# Patient Record
Sex: Female | Born: 1935 | Race: White | Hispanic: No | Marital: Married | Smoking: Never smoker
Health system: Southern US, Community
[De-identification: ages and names within clinical notes are randomized; demographics above are authoritative.]

## PROBLEM LIST (undated history)

## (undated) DIAGNOSIS — I341 Nonrheumatic mitral (valve) prolapse: Secondary | ICD-10-CM

## (undated) DIAGNOSIS — M1990 Unspecified osteoarthritis, unspecified site: Secondary | ICD-10-CM

## (undated) DIAGNOSIS — R011 Cardiac murmur, unspecified: Secondary | ICD-10-CM

## (undated) DIAGNOSIS — E559 Vitamin D deficiency, unspecified: Secondary | ICD-10-CM

## (undated) DIAGNOSIS — R413 Other amnesia: Secondary | ICD-10-CM

## (undated) DIAGNOSIS — G4700 Insomnia, unspecified: Secondary | ICD-10-CM

## (undated) DIAGNOSIS — G43909 Migraine, unspecified, not intractable, without status migrainosus: Secondary | ICD-10-CM

## (undated) DIAGNOSIS — R569 Unspecified convulsions: Secondary | ICD-10-CM

## (undated) DIAGNOSIS — H9319 Tinnitus, unspecified ear: Secondary | ICD-10-CM

## (undated) DIAGNOSIS — M8580 Other specified disorders of bone density and structure, unspecified site: Secondary | ICD-10-CM

## (undated) DIAGNOSIS — I10 Essential (primary) hypertension: Secondary | ICD-10-CM

## (undated) DIAGNOSIS — E7800 Pure hypercholesterolemia, unspecified: Secondary | ICD-10-CM

## (undated) HISTORY — PX: GALLBLADDER SURGERY: SHX652

## (undated) HISTORY — DX: Other amnesia: R41.3

## (undated) HISTORY — DX: Essential (primary) hypertension: I10

## (undated) HISTORY — DX: Tinnitus, unspecified ear: H93.19

## (undated) HISTORY — PX: CHOLECYSTECTOMY: SHX55

## (undated) HISTORY — DX: Other specified disorders of bone density and structure, unspecified site: M85.80

## (undated) HISTORY — DX: Pure hypercholesterolemia, unspecified: E78.00

## (undated) HISTORY — DX: Insomnia, unspecified: G47.00

## (undated) HISTORY — DX: Nonrheumatic mitral (valve) prolapse: I34.1

## (undated) HISTORY — DX: Unspecified osteoarthritis, unspecified site: M19.90

## (undated) HISTORY — DX: Cardiac murmur, unspecified: R01.1

## (undated) HISTORY — PX: CORRECTION HAMMER TOE: SUR317

## (undated) HISTORY — DX: Migraine, unspecified, not intractable, without status migrainosus: G43.909

## (undated) HISTORY — DX: Vitamin D deficiency, unspecified: E55.9

## (undated) HISTORY — PX: OOPHORECTOMY: SHX86

---

## 1972-12-11 HISTORY — PX: PARTIAL HYSTERECTOMY: SHX80

## 1998-09-08 ENCOUNTER — Other Ambulatory Visit: Admission: RE | Admit: 1998-09-08 | Discharge: 1998-09-08 | Payer: Self-pay | Admitting: Gynecology

## 1998-11-23 ENCOUNTER — Ambulatory Visit (HOSPITAL_COMMUNITY): Admission: RE | Admit: 1998-11-23 | Discharge: 1998-11-23 | Payer: Self-pay | Admitting: Obstetrics & Gynecology

## 1998-11-25 ENCOUNTER — Ambulatory Visit (HOSPITAL_COMMUNITY): Admission: RE | Admit: 1998-11-25 | Discharge: 1998-11-25 | Payer: Self-pay | Admitting: Gastroenterology

## 1999-10-20 ENCOUNTER — Encounter: Admission: RE | Admit: 1999-10-20 | Discharge: 1999-10-20 | Payer: Self-pay | Admitting: Internal Medicine

## 1999-10-20 ENCOUNTER — Encounter: Payer: Self-pay | Admitting: Internal Medicine

## 1999-11-07 ENCOUNTER — Other Ambulatory Visit: Admission: RE | Admit: 1999-11-07 | Discharge: 1999-11-07 | Payer: Self-pay | Admitting: Gynecology

## 2000-08-22 ENCOUNTER — Encounter: Payer: Self-pay | Admitting: Internal Medicine

## 2000-08-22 ENCOUNTER — Encounter: Admission: RE | Admit: 2000-08-22 | Discharge: 2000-08-22 | Payer: Self-pay | Admitting: Internal Medicine

## 2001-04-04 ENCOUNTER — Other Ambulatory Visit: Admission: RE | Admit: 2001-04-04 | Discharge: 2001-04-04 | Payer: Self-pay | Admitting: Gynecology

## 2001-09-02 ENCOUNTER — Encounter: Payer: Self-pay | Admitting: *Deleted

## 2001-09-02 ENCOUNTER — Encounter: Admission: RE | Admit: 2001-09-02 | Discharge: 2001-09-02 | Payer: Self-pay | Admitting: *Deleted

## 2002-07-17 ENCOUNTER — Encounter: Payer: Self-pay | Admitting: Internal Medicine

## 2002-07-17 ENCOUNTER — Encounter: Admission: RE | Admit: 2002-07-17 | Discharge: 2002-07-17 | Payer: Self-pay | Admitting: Internal Medicine

## 2002-07-21 ENCOUNTER — Other Ambulatory Visit: Admission: RE | Admit: 2002-07-21 | Discharge: 2002-07-21 | Payer: Self-pay | Admitting: Gynecology

## 2002-10-28 ENCOUNTER — Encounter: Admission: RE | Admit: 2002-10-28 | Discharge: 2002-10-28 | Payer: Self-pay | Admitting: Internal Medicine

## 2002-10-28 ENCOUNTER — Encounter: Payer: Self-pay | Admitting: Internal Medicine

## 2003-10-30 ENCOUNTER — Encounter: Admission: RE | Admit: 2003-10-30 | Discharge: 2003-10-30 | Payer: Self-pay | Admitting: Internal Medicine

## 2004-12-14 ENCOUNTER — Encounter: Admission: RE | Admit: 2004-12-14 | Discharge: 2004-12-14 | Payer: Self-pay | Admitting: Internal Medicine

## 2005-04-19 ENCOUNTER — Other Ambulatory Visit: Admission: RE | Admit: 2005-04-19 | Discharge: 2005-04-19 | Payer: Self-pay | Admitting: Obstetrics and Gynecology

## 2005-12-29 ENCOUNTER — Encounter: Admission: RE | Admit: 2005-12-29 | Discharge: 2005-12-29 | Payer: Self-pay | Admitting: Obstetrics and Gynecology

## 2007-01-10 ENCOUNTER — Encounter: Admission: RE | Admit: 2007-01-10 | Discharge: 2007-01-10 | Payer: Self-pay | Admitting: Internal Medicine

## 2008-01-13 ENCOUNTER — Encounter: Admission: RE | Admit: 2008-01-13 | Discharge: 2008-01-13 | Payer: Self-pay | Admitting: Internal Medicine

## 2008-09-29 ENCOUNTER — Other Ambulatory Visit: Admission: RE | Admit: 2008-09-29 | Discharge: 2008-09-29 | Payer: Self-pay | Admitting: Obstetrics and Gynecology

## 2009-01-13 ENCOUNTER — Encounter: Admission: RE | Admit: 2009-01-13 | Discharge: 2009-01-13 | Payer: Self-pay | Admitting: Obstetrics and Gynecology

## 2010-01-11 ENCOUNTER — Other Ambulatory Visit: Admission: RE | Admit: 2010-01-11 | Discharge: 2010-01-11 | Payer: Self-pay | Admitting: Obstetrics and Gynecology

## 2010-02-02 ENCOUNTER — Encounter: Admission: RE | Admit: 2010-02-02 | Discharge: 2010-02-02 | Payer: Self-pay | Admitting: Obstetrics and Gynecology

## 2010-12-31 ENCOUNTER — Other Ambulatory Visit: Payer: Self-pay | Admitting: Internal Medicine

## 2010-12-31 ENCOUNTER — Encounter: Payer: Self-pay | Admitting: Internal Medicine

## 2010-12-31 DIAGNOSIS — Z1239 Encounter for other screening for malignant neoplasm of breast: Secondary | ICD-10-CM

## 2011-02-03 ENCOUNTER — Ambulatory Visit: Payer: Self-pay

## 2011-02-28 ENCOUNTER — Ambulatory Visit
Admission: RE | Admit: 2011-02-28 | Discharge: 2011-02-28 | Disposition: A | Discharge status: Home / Self Care | Payer: MEDICARE | Source: Ambulatory Visit | Attending: Internal Medicine | Admitting: Internal Medicine

## 2011-02-28 DIAGNOSIS — Z1239 Encounter for other screening for malignant neoplasm of breast: Secondary | ICD-10-CM

## 2011-12-25 DIAGNOSIS — R252 Cramp and spasm: Secondary | ICD-10-CM | POA: Diagnosis not present

## 2011-12-25 DIAGNOSIS — E785 Hyperlipidemia, unspecified: Secondary | ICD-10-CM | POA: Diagnosis not present

## 2011-12-25 DIAGNOSIS — I10 Essential (primary) hypertension: Secondary | ICD-10-CM | POA: Diagnosis not present

## 2012-01-25 ENCOUNTER — Other Ambulatory Visit: Payer: Self-pay | Admitting: Internal Medicine

## 2012-01-25 DIAGNOSIS — Z1231 Encounter for screening mammogram for malignant neoplasm of breast: Secondary | ICD-10-CM

## 2012-02-29 ENCOUNTER — Ambulatory Visit
Admission: RE | Admit: 2012-02-29 | Discharge: 2012-02-29 | Disposition: A | Discharge status: Home / Self Care | Payer: MEDICARE | Source: Ambulatory Visit | Attending: Internal Medicine | Admitting: Internal Medicine

## 2012-02-29 DIAGNOSIS — Z1231 Encounter for screening mammogram for malignant neoplasm of breast: Secondary | ICD-10-CM | POA: Diagnosis not present

## 2012-04-01 DIAGNOSIS — G479 Sleep disorder, unspecified: Secondary | ICD-10-CM | POA: Diagnosis not present

## 2012-04-01 DIAGNOSIS — G2589 Other specified extrapyramidal and movement disorders: Secondary | ICD-10-CM | POA: Diagnosis not present

## 2012-04-16 DIAGNOSIS — L608 Other nail disorders: Secondary | ICD-10-CM | POA: Diagnosis not present

## 2012-05-15 DIAGNOSIS — R252 Cramp and spasm: Secondary | ICD-10-CM | POA: Diagnosis not present

## 2012-06-25 DIAGNOSIS — R4182 Altered mental status, unspecified: Secondary | ICD-10-CM | POA: Diagnosis not present

## 2012-06-25 DIAGNOSIS — R404 Transient alteration of awareness: Secondary | ICD-10-CM | POA: Diagnosis not present

## 2012-07-05 DIAGNOSIS — H6120 Impacted cerumen, unspecified ear: Secondary | ICD-10-CM | POA: Diagnosis not present

## 2012-07-05 DIAGNOSIS — H93299 Other abnormal auditory perceptions, unspecified ear: Secondary | ICD-10-CM | POA: Diagnosis not present

## 2012-07-08 ENCOUNTER — Other Ambulatory Visit: Payer: Self-pay | Admitting: Internal Medicine

## 2012-07-08 DIAGNOSIS — I10 Essential (primary) hypertension: Secondary | ICD-10-CM | POA: Diagnosis not present

## 2012-07-08 DIAGNOSIS — G43009 Migraine without aura, not intractable, without status migrainosus: Secondary | ICD-10-CM | POA: Diagnosis not present

## 2012-07-08 DIAGNOSIS — Z0000 Encounter for general adult medical examination without abnormal findings: Secondary | ICD-10-CM | POA: Diagnosis not present

## 2012-07-08 DIAGNOSIS — F05 Delirium due to known physiological condition: Secondary | ICD-10-CM | POA: Diagnosis not present

## 2012-07-08 DIAGNOSIS — R410 Disorientation, unspecified: Secondary | ICD-10-CM

## 2012-07-08 DIAGNOSIS — E785 Hyperlipidemia, unspecified: Secondary | ICD-10-CM | POA: Diagnosis not present

## 2012-07-09 ENCOUNTER — Ambulatory Visit
Admission: RE | Admit: 2012-07-09 | Discharge: 2012-07-09 | Disposition: A | Discharge status: Home / Self Care | Payer: Medicare Other | Source: Ambulatory Visit | Attending: Internal Medicine | Admitting: Internal Medicine

## 2012-07-09 DIAGNOSIS — I059 Rheumatic mitral valve disease, unspecified: Secondary | ICD-10-CM | POA: Diagnosis not present

## 2012-07-09 DIAGNOSIS — I6529 Occlusion and stenosis of unspecified carotid artery: Secondary | ICD-10-CM | POA: Diagnosis not present

## 2012-07-09 DIAGNOSIS — F29 Unspecified psychosis not due to a substance or known physiological condition: Secondary | ICD-10-CM | POA: Diagnosis not present

## 2012-07-09 DIAGNOSIS — R410 Disorientation, unspecified: Secondary | ICD-10-CM

## 2012-07-09 DIAGNOSIS — R4789 Other speech disturbances: Secondary | ICD-10-CM | POA: Diagnosis not present

## 2012-07-09 DIAGNOSIS — F05 Delirium due to known physiological condition: Secondary | ICD-10-CM | POA: Diagnosis not present

## 2012-07-17 DIAGNOSIS — E7800 Pure hypercholesterolemia, unspecified: Secondary | ICD-10-CM | POA: Diagnosis not present

## 2012-07-17 DIAGNOSIS — Z01419 Encounter for gynecological examination (general) (routine) without abnormal findings: Secondary | ICD-10-CM | POA: Diagnosis not present

## 2012-07-17 DIAGNOSIS — R4701 Aphasia: Secondary | ICD-10-CM | POA: Diagnosis not present

## 2012-07-17 DIAGNOSIS — G43909 Migraine, unspecified, not intractable, without status migrainosus: Secondary | ICD-10-CM | POA: Diagnosis not present

## 2012-08-06 DIAGNOSIS — M76899 Other specified enthesopathies of unspecified lower limb, excluding foot: Secondary | ICD-10-CM | POA: Diagnosis not present

## 2012-08-06 DIAGNOSIS — G479 Sleep disorder, unspecified: Secondary | ICD-10-CM | POA: Diagnosis not present

## 2012-08-06 DIAGNOSIS — R252 Cramp and spasm: Secondary | ICD-10-CM | POA: Diagnosis not present

## 2012-09-04 DIAGNOSIS — M79609 Pain in unspecified limb: Secondary | ICD-10-CM | POA: Diagnosis not present

## 2012-09-04 DIAGNOSIS — M2040 Other hammer toe(s) (acquired), unspecified foot: Secondary | ICD-10-CM | POA: Diagnosis not present

## 2012-09-04 DIAGNOSIS — L608 Other nail disorders: Secondary | ICD-10-CM | POA: Diagnosis not present

## 2012-09-09 DIAGNOSIS — L578 Other skin changes due to chronic exposure to nonionizing radiation: Secondary | ICD-10-CM | POA: Diagnosis not present

## 2012-09-09 DIAGNOSIS — L570 Actinic keratosis: Secondary | ICD-10-CM | POA: Diagnosis not present

## 2012-09-09 DIAGNOSIS — D692 Other nonthrombocytopenic purpura: Secondary | ICD-10-CM | POA: Diagnosis not present

## 2012-09-09 DIAGNOSIS — L820 Inflamed seborrheic keratosis: Secondary | ICD-10-CM | POA: Diagnosis not present

## 2012-09-09 DIAGNOSIS — L819 Disorder of pigmentation, unspecified: Secondary | ICD-10-CM | POA: Diagnosis not present

## 2012-10-06 ENCOUNTER — Ambulatory Visit (INDEPENDENT_AMBULATORY_CARE_PROVIDER_SITE_OTHER): Payer: Medicare Other | Admitting: Emergency Medicine

## 2012-10-06 VITALS — BP 127/71 | HR 79 | Temp 97.4°F | Resp 16 | Ht 63.75 in | Wt 136.0 lb

## 2012-10-06 DIAGNOSIS — N3000 Acute cystitis without hematuria: Secondary | ICD-10-CM | POA: Diagnosis not present

## 2012-10-06 DIAGNOSIS — R309 Painful micturition, unspecified: Secondary | ICD-10-CM

## 2012-10-06 LAB — POCT URINALYSIS DIPSTICK
Bilirubin, UA: NEGATIVE
Blood, UA: NEGATIVE
Glucose, UA: NEGATIVE
Ketones, UA: NEGATIVE
Nitrite, UA: POSITIVE
Protein, UA: NEGATIVE
Spec Grav, UA: 1.01
Urobilinogen, UA: 0.2
pH, UA: 5.5

## 2012-10-06 LAB — POCT UA - MICROSCOPIC ONLY
Casts, Ur, LPF, POC: NEGATIVE
Crystals, Ur, HPF, POC: NEGATIVE
Mucus, UA: NEGATIVE
RBC, urine, microscopic: NEGATIVE
Yeast, UA: NEGATIVE

## 2012-10-06 MED ORDER — SULFAMETHOXAZOLE-TRIMETHOPRIM 800-160 MG PO TABS: 1.0000 | ORAL_TABLET | Freq: Two times a day (BID) | ORAL | Status: DC

## 2012-10-06 MED ORDER — PHENAZOPYRIDINE HCL 200 MG PO TABS: 200.0000 mg | ORAL_TABLET | Freq: Three times a day (TID) | ORAL | Status: AC | PRN

## 2012-10-06 NOTE — Progress Notes (Signed)
[facility] [address], [place] [address] [phone]  Date:  10/06/2012   Name:  Renee Zuniga   DOB:  [birth year]   MRN:  [id number]  PCP:  No primary provider on file.    Chief Complaint: Urinary Tract Infection   History of Present Illness:  Renee Zuniga is a 78 y.o. very pleasant female patient who presents with the following:  Ill this week with marked increase in frequency.  No incontinence.  No fever or chills. No abdominal or back pain, nausea or vomiting.  No stool change, blood in urine or stool, black stool.  History of repeated urinary infections.    There is no problem list on file for this patient.   Past Medical History  Diagnosis Date  . Arthritis   . Heart murmur     Past Surgical History  Procedure Date  . Cholecystectomy   . Abdominal hysterectomy     History  Substance Use Topics  . Smoking status: Never Smoker   . Smokeless tobacco: Not on file  . Alcohol Use:     Family History  Problem Relation Age of Onset  . Mitral valve prolapse Father     No Known Allergies  Medication list has been reviewed and updated.  Current Outpatient Prescriptions on File Prior to Visit  Medication Sig Dispense Refill  . calcium carbonate (OS-CAL - DOSED IN MG OF ELEMENTAL CALCIUM) 1250 MG tablet Take 1 tablet by mouth daily.      Marland Kitchen lisinopril (PRINIVIL,ZESTRIL) 10 MG tablet Take 10 mg by mouth daily.        Review of Systems:  As per HPI, otherwise negative.    Physical Examination: Filed Vitals:   10/06/12 0820  BP: 127/71  Pulse: 79  Temp: 97.4 F (36.3 C)  Resp: 16   Filed Vitals:   10/06/12 0820  Height: 5' 3.75" (1.619 m)  Weight: 136 lb (61.689 kg)   Body mass index is 23.53 kg/(m^2). Ideal Body Weight: Weight in (lb) to have BMI = 25: 144.2    GEN: WDWN, NAD, Non-toxic, Alert & Oriented x 3 no rash or sepsis HEENT: Atraumatic, Normocephalic. Oropharynx negative Ears and Nose: No external deformity.  TM  negative ABD:  Benign soft not tender.  No CVA tender EXTR: No clubbing/cyanosis/edema NEURO: Normal gait.  PSYCH: Normally interactive. Conversant. Not depressed or anxious appearing.  Calm demeanor.    Assessment and Plan: Acute cystitis Septra ds Pyridium Follow up as needed  Carmelina Dane, MD  Results for orders placed in visit on 10/06/12  POCT UA - MICROSCOPIC ONLY      Component Value Range   WBC, Ur, HPF, POC 2-5 with small clumps     RBC, urine, microscopic neg     Bacteria, U Microscopic 4++     Mucus, UA neg     Epithelial cells, urine per micros 0-1     Crystals, Ur, HPF, POC neg     Casts, Ur, LPF, POC neg     Yeast, UA neg    POCT URINALYSIS DIPSTICK      Component Value Range   Color, UA yellow     Clarity, UA clear     Glucose, UA neg     Bilirubin, UA neg     Ketones, UA neg     Spec Grav, UA 1.010     Blood, UA neg     pH, UA 5.5  Protein, UA neg     Urobilinogen, UA 0.2     Nitrite, UA positive     Leukocytes, UA Trace

## 2012-10-10 DIAGNOSIS — G2589 Other specified extrapyramidal and movement disorders: Secondary | ICD-10-CM | POA: Diagnosis not present

## 2012-10-14 NOTE — Progress Notes (Signed)
Reviewed and agree.

## 2012-11-11 DIAGNOSIS — G479 Sleep disorder, unspecified: Secondary | ICD-10-CM | POA: Diagnosis not present

## 2012-11-11 DIAGNOSIS — G2589 Other specified extrapyramidal and movement disorders: Secondary | ICD-10-CM | POA: Diagnosis not present

## 2012-11-11 DIAGNOSIS — Z23 Encounter for immunization: Secondary | ICD-10-CM | POA: Diagnosis not present

## 2012-11-12 DIAGNOSIS — M76829 Posterior tibial tendinitis, unspecified leg: Secondary | ICD-10-CM | POA: Diagnosis not present

## 2012-11-12 DIAGNOSIS — M2140 Flat foot [pes planus] (acquired), unspecified foot: Secondary | ICD-10-CM | POA: Diagnosis not present

## 2012-12-02 DIAGNOSIS — L608 Other nail disorders: Secondary | ICD-10-CM | POA: Diagnosis not present

## 2012-12-03 DIAGNOSIS — G479 Sleep disorder, unspecified: Secondary | ICD-10-CM | POA: Diagnosis not present

## 2012-12-03 DIAGNOSIS — M1990 Unspecified osteoarthritis, unspecified site: Secondary | ICD-10-CM | POA: Diagnosis not present

## 2012-12-03 DIAGNOSIS — N183 Chronic kidney disease, stage 3 unspecified: Secondary | ICD-10-CM | POA: Diagnosis not present

## 2012-12-18 DIAGNOSIS — R209 Unspecified disturbances of skin sensation: Secondary | ICD-10-CM | POA: Diagnosis not present

## 2012-12-18 DIAGNOSIS — M6240 Contracture of muscle, unspecified site: Secondary | ICD-10-CM | POA: Diagnosis not present

## 2012-12-18 DIAGNOSIS — Q742 Other congenital malformations of lower limb(s), including pelvic girdle: Secondary | ICD-10-CM | POA: Diagnosis not present

## 2012-12-18 DIAGNOSIS — Z01818 Encounter for other preprocedural examination: Secondary | ICD-10-CM | POA: Diagnosis not present

## 2012-12-18 DIAGNOSIS — Z09 Encounter for follow-up examination after completed treatment for conditions other than malignant neoplasm: Secondary | ICD-10-CM | POA: Diagnosis not present

## 2012-12-18 DIAGNOSIS — L608 Other nail disorders: Secondary | ICD-10-CM | POA: Diagnosis not present

## 2012-12-31 DIAGNOSIS — M79609 Pain in unspecified limb: Secondary | ICD-10-CM | POA: Diagnosis not present

## 2012-12-31 DIAGNOSIS — N390 Urinary tract infection, site not specified: Secondary | ICD-10-CM | POA: Diagnosis not present

## 2012-12-31 DIAGNOSIS — M2040 Other hammer toe(s) (acquired), unspecified foot: Secondary | ICD-10-CM | POA: Diagnosis not present

## 2013-01-13 DIAGNOSIS — M2040 Other hammer toe(s) (acquired), unspecified foot: Secondary | ICD-10-CM | POA: Diagnosis not present

## 2013-01-13 DIAGNOSIS — M6240 Contracture of muscle, unspecified site: Secondary | ICD-10-CM | POA: Diagnosis not present

## 2013-01-21 ENCOUNTER — Other Ambulatory Visit: Payer: Self-pay | Admitting: Internal Medicine

## 2013-01-21 DIAGNOSIS — Z1231 Encounter for screening mammogram for malignant neoplasm of breast: Secondary | ICD-10-CM

## 2013-02-10 DIAGNOSIS — Z09 Encounter for follow-up examination after completed treatment for conditions other than malignant neoplasm: Secondary | ICD-10-CM | POA: Diagnosis not present

## 2013-02-10 DIAGNOSIS — M2030 Hallux varus (acquired), unspecified foot: Secondary | ICD-10-CM | POA: Diagnosis not present

## 2013-02-25 DIAGNOSIS — L820 Inflamed seborrheic keratosis: Secondary | ICD-10-CM | POA: Diagnosis not present

## 2013-02-25 DIAGNOSIS — L821 Other seborrheic keratosis: Secondary | ICD-10-CM | POA: Diagnosis not present

## 2013-02-25 DIAGNOSIS — L819 Disorder of pigmentation, unspecified: Secondary | ICD-10-CM | POA: Diagnosis not present

## 2013-03-10 ENCOUNTER — Ambulatory Visit
Admission: RE | Admit: 2013-03-10 | Discharge: 2013-03-10 | Disposition: A | Discharge status: Home / Self Care | Payer: Medicare Other | Source: Ambulatory Visit | Attending: Internal Medicine | Admitting: Internal Medicine

## 2013-03-10 DIAGNOSIS — Z1231 Encounter for screening mammogram for malignant neoplasm of breast: Secondary | ICD-10-CM | POA: Diagnosis not present

## 2013-03-11 DIAGNOSIS — G479 Sleep disorder, unspecified: Secondary | ICD-10-CM | POA: Diagnosis not present

## 2013-03-11 DIAGNOSIS — G2589 Other specified extrapyramidal and movement disorders: Secondary | ICD-10-CM | POA: Diagnosis not present

## 2013-03-25 DIAGNOSIS — H2510 Age-related nuclear cataract, unspecified eye: Secondary | ICD-10-CM | POA: Diagnosis not present

## 2013-03-27 DIAGNOSIS — J069 Acute upper respiratory infection, unspecified: Secondary | ICD-10-CM | POA: Diagnosis not present

## 2013-04-25 DIAGNOSIS — G479 Sleep disorder, unspecified: Secondary | ICD-10-CM | POA: Diagnosis not present

## 2013-07-03 DIAGNOSIS — R350 Frequency of micturition: Secondary | ICD-10-CM | POA: Diagnosis not present

## 2013-07-14 DIAGNOSIS — N390 Urinary tract infection, site not specified: Secondary | ICD-10-CM | POA: Diagnosis not present

## 2013-07-31 DIAGNOSIS — G479 Sleep disorder, unspecified: Secondary | ICD-10-CM | POA: Diagnosis not present

## 2013-08-01 DIAGNOSIS — R413 Other amnesia: Secondary | ICD-10-CM | POA: Diagnosis not present

## 2013-08-01 DIAGNOSIS — F05 Delirium due to known physiological condition: Secondary | ICD-10-CM | POA: Diagnosis not present

## 2013-08-05 DIAGNOSIS — L608 Other nail disorders: Secondary | ICD-10-CM | POA: Diagnosis not present

## 2013-08-13 DIAGNOSIS — R350 Frequency of micturition: Secondary | ICD-10-CM | POA: Diagnosis not present

## 2013-08-13 DIAGNOSIS — Z01419 Encounter for gynecological examination (general) (routine) without abnormal findings: Secondary | ICD-10-CM | POA: Diagnosis not present

## 2013-08-13 DIAGNOSIS — N8111 Cystocele, midline: Secondary | ICD-10-CM | POA: Diagnosis not present

## 2013-08-13 DIAGNOSIS — I059 Rheumatic mitral valve disease, unspecified: Secondary | ICD-10-CM | POA: Diagnosis not present

## 2013-08-19 DIAGNOSIS — G479 Sleep disorder, unspecified: Secondary | ICD-10-CM | POA: Diagnosis not present

## 2013-08-19 DIAGNOSIS — I10 Essential (primary) hypertension: Secondary | ICD-10-CM | POA: Diagnosis not present

## 2013-08-19 DIAGNOSIS — E785 Hyperlipidemia, unspecified: Secondary | ICD-10-CM | POA: Diagnosis not present

## 2013-08-19 DIAGNOSIS — R350 Frequency of micturition: Secondary | ICD-10-CM | POA: Diagnosis not present

## 2013-08-19 DIAGNOSIS — M810 Age-related osteoporosis without current pathological fracture: Secondary | ICD-10-CM | POA: Diagnosis not present

## 2013-08-19 DIAGNOSIS — R413 Other amnesia: Secondary | ICD-10-CM | POA: Diagnosis not present

## 2013-08-19 DIAGNOSIS — N183 Chronic kidney disease, stage 3 unspecified: Secondary | ICD-10-CM | POA: Diagnosis not present

## 2013-08-23 ENCOUNTER — Encounter: Payer: Self-pay | Admitting: Neurology

## 2013-08-23 DIAGNOSIS — H9319 Tinnitus, unspecified ear: Secondary | ICD-10-CM | POA: Insufficient documentation

## 2013-08-23 DIAGNOSIS — R413 Other amnesia: Secondary | ICD-10-CM

## 2013-08-23 DIAGNOSIS — I10 Essential (primary) hypertension: Secondary | ICD-10-CM

## 2013-08-23 DIAGNOSIS — M1990 Unspecified osteoarthritis, unspecified site: Secondary | ICD-10-CM | POA: Insufficient documentation

## 2013-08-23 DIAGNOSIS — I341 Nonrheumatic mitral (valve) prolapse: Secondary | ICD-10-CM | POA: Insufficient documentation

## 2013-08-23 DIAGNOSIS — G43909 Migraine, unspecified, not intractable, without status migrainosus: Secondary | ICD-10-CM

## 2013-08-23 DIAGNOSIS — E7800 Pure hypercholesterolemia, unspecified: Secondary | ICD-10-CM

## 2013-08-23 DIAGNOSIS — E559 Vitamin D deficiency, unspecified: Secondary | ICD-10-CM

## 2013-08-23 DIAGNOSIS — R011 Cardiac murmur, unspecified: Secondary | ICD-10-CM | POA: Insufficient documentation

## 2013-08-25 DIAGNOSIS — R339 Retention of urine, unspecified: Secondary | ICD-10-CM | POA: Diagnosis not present

## 2013-08-25 DIAGNOSIS — N8111 Cystocele, midline: Secondary | ICD-10-CM | POA: Diagnosis not present

## 2013-08-26 ENCOUNTER — Ambulatory Visit (INDEPENDENT_AMBULATORY_CARE_PROVIDER_SITE_OTHER): Payer: Medicare Other | Admitting: Neurology

## 2013-08-26 ENCOUNTER — Encounter: Payer: Self-pay | Admitting: Neurology

## 2013-08-26 ENCOUNTER — Telehealth: Payer: Self-pay | Admitting: Neurology

## 2013-08-26 VITALS — BP 145/82 | HR 100 | Ht 64.5 in | Wt 143.0 lb

## 2013-08-26 DIAGNOSIS — F05 Delirium due to known physiological condition: Secondary | ICD-10-CM | POA: Insufficient documentation

## 2013-08-26 DIAGNOSIS — E559 Vitamin D deficiency, unspecified: Secondary | ICD-10-CM

## 2013-08-26 DIAGNOSIS — G43909 Migraine, unspecified, not intractable, without status migrainosus: Secondary | ICD-10-CM

## 2013-08-26 DIAGNOSIS — I10 Essential (primary) hypertension: Secondary | ICD-10-CM | POA: Diagnosis not present

## 2013-08-26 DIAGNOSIS — E7800 Pure hypercholesterolemia, unspecified: Secondary | ICD-10-CM | POA: Diagnosis not present

## 2013-08-26 NOTE — Progress Notes (Signed)
[facility]  PATIENT: Renee Zuniga DOB: 1931/01/04  HISTORICAL  Renee Zuniga is 79 years old right-handed Caucasian female, is referred by her primary care physician Dr. Nehemiah Settle for evaluation of one episode of confusion.  She has past medical history of mitral valve relapse, migraine headaches, she also has frequent muscle cramping, insomnia, she has been taking different sleeping aid,  she used to take Ambien, about a year ago, she began to worry about the potential side effect of Ambien, change to Lunesta, 3mg  qhs.   One night in early August 2014, she took 3 mg [place] like what she usually does, next morning, she was able to go down stairs without any difficulty, but she noticed she was confused, could not get her words out, she denies lateralized motor or sensory deficit, no swallowing difficulty, symptoms last about 10 hours, she presented to her primary care physician, but no imaging study was initiated.  In July 2013, she had one episode of confusion, speech disturbance, with associated headaches, she had evaluation at that time, MRI of the brain showed showed moderate small vessel disease, no acute lesions, MRA of the brain showed no large vessel disease, echocardiogram showed preserved ejection fraction.  Ultrasound of carotid arteries showed less than 50% stenosis of bilateral internal carotid artery  REVIEW OF SYSTEMS: Full 14 system review of systems performed and notable only for confusion  ALLERGIES: Allergies  Allergen Reactions  . Demerol [Meperidine]     Sick on stomach   . Lunesta [Eszopiclone]     Speech     HOME MEDICATIONS: Outpatient Prescriptions Prior to Visit  Medication Sig Dispense Refill  . aspirin 81 MG tablet Take 81 mg by mouth daily.      . calcium carbonate (OS-CAL - DOSED IN MG OF ELEMENTAL CALCIUM) 1250 MG tablet Take 1 tablet by mouth daily.      . diclofenac (VOLTAREN) 75 MG EC tablet Take 75 mg by mouth daily.      .  Eszopiclone (ESZOPICLONE) 3 MG TABS Take 3 mg by mouth at bedtime. Take immediately before bedtime      . ezetimibe (ZETIA) 10 MG tablet Take 10 mg by mouth daily.      Marland Kitchen ibandronate (BONIVA) 150 MG tablet Take 150 mg by mouth every 30 (thirty) days. Take in the morning with a full glass of water, on an empty stomach, and do not take anything else by mouth or lie down for the next 30 min.      . isometheptene-acetaminophen-dichloralphenazone (MIDRIN) 65-325-100 MG capsule Take 1 capsule by mouth 4 (four) times daily as needed. Maximum 5 capsules in 12 hours for migraine headaches, 8 capsules in 24 hours for tension headaches.      . lidocaine (LIDODERM) 5 % Place 1 patch onto the skin daily. Remove & Discard patch within 12 hours or as directed by MD      . lisinopril (PRINIVIL,ZESTRIL) 10 MG tablet Take 10 mg by mouth daily.      . Magnesium 250 MG TABS Take by mouth as directed.      . Multiple Vitamins-Minerals (MULTIVITAMIN WITH MINERALS) tablet Take 1 tablet by mouth daily.      . Omega-3 Fatty Acids (FISH OIL) 1000 MG CAPS Take by mouth as directed.      . sulfamethoxazole-trimethoprim (BACTRIM DS,SEPTRA DS) 800-160 MG per tablet Take 1 tablet by mouth 2 (two) times daily.  20 tablet  0   No facility-administered medications prior to  visit.    PAST MEDICAL HISTORY: Past Medical History  Diagnosis Date  . Arthritis   . Heart murmur   . High blood pressure   . Mitral valve prolapse   . Osteoarthritis   . Vitamin D deficiency   . Migraine headache   . Hypercholesteremia   . Tinnitus   . Osteopenia   . Memory loss     PAST SURGICAL HISTORY: Past Surgical History  Procedure Laterality Date  . Cholecystectomy    . Abdominal hysterectomy    . Gallbladder surgery    . Foot surgery Bilateral     FAMILY HISTORY: Family History  Problem Relation Age of Onset  . Mitral valve prolapse Father   . Heart Problems Father   . Heart Problems Mother     SOCIAL HISTORY:  History    Social History  . Marital Status: Married    Spouse Name: Roe Coombs    Number of Children: 5  . Years of Education: college   Occupational History  .      Retired   Social History Main Topics  . Smoking status: Never Smoker   . Smokeless tobacco: Never Used  . Alcohol Use: 0.6 oz/week    1 Glasses of wine per week     Comment: with dinner  . Drug Use: No  . Sexual Activity: Not Currently    Social History Narrative   Patient is retired and lives at home with her husband Roe Coombs). Patient has college education.    Caffeine- tea - four glasses.   Right handed.   PHYSICAL EXAM  Filed Vitals:   08/26/13 0810  BP: 145/82  Pulse: 100  Height: 5' 4.5" (1.638 m)  Weight: 143 lb (64.864 kg)   Body mass index is 24.18 kg/(m^2).   Generalized: In no acute distress  Neck: Supple, no carotid bruits   Cardiac: Regular rate rhythm  Pulmonary: Clear to auscultation bilaterally  Musculoskeletal: No deformity  Neurological examination  Mentation: Alert oriented to time, place, history taking, and causual conversation Mini-Mental Status Examination is 30 out of 30,   Cranial nerve II-XII: Pupils were equal round reactive to light extraocular movements were full, visual field were full on confrontational test. facial sensation and strength were normal. hearing was intact to finger rubbing bilaterally. Uvula tongue midline.  head turning and shoulder shrug and were normal and symmetric.Tongue protrusion into cheek strength was normal.  Motor: normal tone, bulk and strength.  Sensory: Intact to fine touch, pinprick, preserved vibratory sensation, and proprioception at toes.  Coordination: Normal finger to nose, heel-to-shin bilaterally there was no truncal ataxia  Gait: Rising up from seated position without assistance, normal stance, without trunk ataxia, moderate stride, good arm swing, smooth turning, she has flatfoot.  Romberg signs: Negative  Deep tendon reflexes:  Brachioradialis 2/2, biceps 2/2, triceps 2/2, patellar 2/2, Achilles trace, plantar responses were flexor bilaterally.   DIAGNOSTIC DATA (LABS, IMAGING, TESTING) - I reviewed patient records, labs, notes, testing and imaging myself where available.   ASSESSMENT AND PLAN  79 years old right-handed Caucasian female, with past medical history of migraine headaches, presenting with one prolonged episode of confusion, there was no lateralized difficulty, no headaches, now she is back to baseline, Mini-Mental Status Examination is 30 out of 30,  1. differentiation diagnosis includes stroke, complicated migraine, keep daily ASA. 2. I have suggested MRI of the brain, MRA of brain  for evaluation, she does not want to proceed with evaluation  3. she will  return to clinic if new issues arise,      Levert Feinstein, M.D. Ph.D.  [facility] [address], [address] [place], [place] [address] [phone]

## 2013-08-29 NOTE — Telephone Encounter (Signed)
error 

## 2013-08-31 ENCOUNTER — Ambulatory Visit (INDEPENDENT_AMBULATORY_CARE_PROVIDER_SITE_OTHER): Payer: Medicare Other | Admitting: Internal Medicine

## 2013-08-31 VITALS — BP 140/76 | HR 84 | Temp 97.8°F | Resp 16 | Ht 63.5 in | Wt 143.4 lb

## 2013-08-31 DIAGNOSIS — R51 Headache: Secondary | ICD-10-CM | POA: Diagnosis not present

## 2013-08-31 DIAGNOSIS — H9209 Otalgia, unspecified ear: Secondary | ICD-10-CM

## 2013-08-31 DIAGNOSIS — H9201 Otalgia, right ear: Secondary | ICD-10-CM

## 2013-08-31 NOTE — Progress Notes (Signed)
  Subjective:    Patient ID: Renee Zuniga, female    DOB: 1931-12-04, 79 y.o.   MRN: [id number]  HPI Pt presents with right ear pain. This started last night. One incidence of a sharp shooting pain. No other sx. She took one Aspirin 325 mg. Has not had pain since then. Lasted less than 1 min. No hearing loss, vision change, focal motor or sensory loss, speech loss. Has hx of cns event in past and has neurology, is scheduled for an MRI next week of brain   Review of Systems Dr. Nehemiah Settle internist    Objective:   Physical Exam  Vitals reviewed. Constitutional: She is oriented to person, place, and time. She appears well-developed and well-nourished. No distress.  HENT:  Head: Normocephalic.  Right Ear: External ear normal.  Left Ear: External ear normal.  Nose: Nose normal.  Mouth/Throat: Oropharynx is clear and moist.  Eyes: EOM are normal. Pupils are equal, round, and reactive to light.  Neck: Normal range of motion. Neck supple. No tracheal deviation present. No thyromegaly present.  Cardiovascular: Normal rate and normal heart sounds.   Pulmonary/Chest: Effort normal.  Lymphadenopathy:    She has no cervical adenopathy.  Neurological: She is alert and oriented to person, place, and time. No cranial nerve deficit. She exhibits normal muscle tone. Coordination normal.  Skin: No rash noted.  Psychiatric: She has a normal mood and affect.    Normal exam      Assessment & Plan:  MRI next week

## 2013-08-31 NOTE — Patient Instructions (Signed)

## 2013-09-01 ENCOUNTER — Telehealth: Payer: Self-pay | Admitting: Neurology

## 2013-09-01 ENCOUNTER — Other Ambulatory Visit: Payer: Self-pay | Admitting: Neurology

## 2013-09-01 DIAGNOSIS — I609 Nontraumatic subarachnoid hemorrhage, unspecified: Secondary | ICD-10-CM

## 2013-09-01 DIAGNOSIS — H9319 Tinnitus, unspecified ear: Secondary | ICD-10-CM

## 2013-09-01 NOTE — Telephone Encounter (Signed)
Okay to dispense Xanax for MRI?  Please advise.  Thank you.

## 2013-09-02 NOTE — Telephone Encounter (Signed)
Yes, it is OK to dispense Xanax for MRI.

## 2013-09-03 ENCOUNTER — Ambulatory Visit
Admission: RE | Admit: 2013-09-03 | Discharge: 2013-09-03 | Disposition: A | Discharge status: Home / Self Care | Payer: Medicare Other | Source: Ambulatory Visit | Attending: Neurology | Admitting: Neurology

## 2013-09-03 DIAGNOSIS — H9319 Tinnitus, unspecified ear: Secondary | ICD-10-CM

## 2013-09-03 DIAGNOSIS — R51 Headache: Secondary | ICD-10-CM | POA: Diagnosis not present

## 2013-09-03 DIAGNOSIS — F05 Delirium due to known physiological condition: Secondary | ICD-10-CM

## 2013-09-03 DIAGNOSIS — I609 Nontraumatic subarachnoid hemorrhage, unspecified: Secondary | ICD-10-CM

## 2013-09-04 DIAGNOSIS — M810 Age-related osteoporosis without current pathological fracture: Secondary | ICD-10-CM | POA: Diagnosis not present

## 2013-09-04 NOTE — Progress Notes (Signed)
Quick Note:  Please call patient, MRI brain showed mild age related changes, no acute lesions. ______

## 2013-09-05 ENCOUNTER — Telehealth: Payer: Self-pay | Admitting: Neurology

## 2013-09-05 NOTE — Telephone Encounter (Signed)
Message copied by Demetrios Loll on Fri Sep 05, 2013 11:03 AM ------      Message from: Levert Feinstein      Created: Thu Sep 04, 2013  2:48 PM       Please call pt for normal MRA brain. ------

## 2013-09-05 NOTE — Telephone Encounter (Signed)
Call pt no answer, left message. °

## 2013-09-07 ENCOUNTER — Other Ambulatory Visit: Payer: Medicare Other

## 2013-09-08 NOTE — Progress Notes (Signed)
Quick Note:  Spoke to patient and relayed MRI results, per Dr. Terrace Arabia. ______

## 2013-09-08 NOTE — Progress Notes (Signed)
Quick Note:  Spoke to patient and relayed normal MRA brain results, per Dr. Terrace Arabia. ______

## 2013-09-17 DIAGNOSIS — R946 Abnormal results of thyroid function studies: Secondary | ICD-10-CM | POA: Diagnosis not present

## 2013-09-27 DIAGNOSIS — N390 Urinary tract infection, site not specified: Secondary | ICD-10-CM | POA: Diagnosis not present

## 2013-10-20 DIAGNOSIS — E039 Hypothyroidism, unspecified: Secondary | ICD-10-CM | POA: Diagnosis not present

## 2013-11-27 ENCOUNTER — Telehealth: Payer: Self-pay | Admitting: Neurology

## 2013-11-27 NOTE — Telephone Encounter (Signed)
Spoke with husband and informed that according to Dr Zannie Cove last OV note, patient is to follow/up if issues arises, he said that patient is doing fine, will call back if needed.

## 2013-11-27 NOTE — Telephone Encounter (Signed)
Patient's husband called stating that he received a reminder voicemail about an appointment for his wife on 12/02/13. Patient's husband is confused about why patient needs to come back for the follow up because he says she is doing just fine. Patient's husband and patient wants to be called for clarification. Please call.

## 2013-12-01 ENCOUNTER — Ambulatory Visit: Payer: Medicare Other | Admitting: Nurse Practitioner

## 2013-12-02 ENCOUNTER — Ambulatory Visit: Payer: Medicare Other | Admitting: Nurse Practitioner

## 2013-12-08 DIAGNOSIS — N390 Urinary tract infection, site not specified: Secondary | ICD-10-CM | POA: Diagnosis not present

## 2013-12-19 DIAGNOSIS — L608 Other nail disorders: Secondary | ICD-10-CM | POA: Diagnosis not present

## 2013-12-19 DIAGNOSIS — M2040 Other hammer toe(s) (acquired), unspecified foot: Secondary | ICD-10-CM | POA: Diagnosis not present

## 2013-12-19 DIAGNOSIS — M21619 Bunion of unspecified foot: Secondary | ICD-10-CM | POA: Diagnosis not present

## 2014-01-14 DIAGNOSIS — N390 Urinary tract infection, site not specified: Secondary | ICD-10-CM | POA: Diagnosis not present

## 2014-01-16 DIAGNOSIS — L608 Other nail disorders: Secondary | ICD-10-CM | POA: Diagnosis not present

## 2014-01-16 DIAGNOSIS — M76829 Posterior tibial tendinitis, unspecified leg: Secondary | ICD-10-CM | POA: Diagnosis not present

## 2014-02-18 ENCOUNTER — Other Ambulatory Visit: Payer: Self-pay

## 2014-02-18 DIAGNOSIS — Z1231 Encounter for screening mammogram for malignant neoplasm of breast: Secondary | ICD-10-CM

## 2014-02-23 ENCOUNTER — Ambulatory Visit (INDEPENDENT_AMBULATORY_CARE_PROVIDER_SITE_OTHER): Payer: Medicare Other | Admitting: Emergency Medicine

## 2014-02-23 VITALS — BP 126/78 | HR 91 | Temp 98.2°F | Resp 17 | Ht 65.0 in | Wt 152.0 lb

## 2014-02-23 DIAGNOSIS — J209 Acute bronchitis, unspecified: Secondary | ICD-10-CM

## 2014-02-23 MED ORDER — BENZONATATE 200 MG PO CAPS: 200.0000 mg | ORAL_CAPSULE | Freq: Two times a day (BID) | ORAL | Status: DC | PRN

## 2014-02-23 NOTE — Progress Notes (Signed)
[facility] [address] [place] [address] [phone]  Date:  02/23/2014   Name:  Renee Zuniga   DOB:  March 07, 1931   MRN:  [id number]  PCP:  Kandice Hams, MD    Chief Complaint: Cough and URI   History of Present Illness:  Renee Zuniga is a 80 y.o. very pleasant female patient who presents with the following:  Ill with cough that is not productive since Friday.  No fever or chills, wheezing or shortness of breath.  No stool change.  Has nasal congestion and drainage.  Mucoid in nature.  No eye complaints.  No sore throat.  No malaise or fatigue.  No improvement with over the counter medications or other home remedies. Denies other complaint or health concern today.   Patient Active Problem List   Diagnosis Date Noted  . Acute confusional state 08/26/2013  . Arthritis   . Heart murmur   . High blood pressure   . Mitral valve prolapse   . Osteoarthritis   . Vitamin D deficiency   . Migraine headache   . Hypercholesteremia   . Tinnitus   . Memory loss     Past Medical History  Diagnosis Date  . Arthritis   . Heart murmur   . High blood pressure   . Mitral valve prolapse   . Osteoarthritis   . Vitamin D deficiency   . Migraine headache   . Hypercholesteremia   . Tinnitus   . Osteopenia   . Memory loss     Past Surgical History  Procedure Laterality Date  . Cholecystectomy    . Abdominal hysterectomy    . Gallbladder surgery    . Foot surgery Bilateral     History  Substance Use Topics  . Smoking status: Never Smoker   . Smokeless tobacco: Never Used  . Alcohol Use: 0.6 oz/week    1 Glasses of wine per week     Comment: with dinner    Family History  Problem Relation Age of Onset  . Mitral valve prolapse Father   . Heart Problems Father   . Heart Problems Mother     Allergies  Allergen Reactions  . Demerol [Meperidine]     Sick on stomach   . Lunesta [Eszopiclone]     Speech     Medication list has been reviewed  and updated.  Current Outpatient Prescriptions on File Prior to Visit  Medication Sig Dispense Refill  . aspirin 81 MG tablet Take 81 mg by mouth daily.      . calcium carbonate (OS-CAL - DOSED IN MG OF ELEMENTAL CALCIUM) 1250 MG tablet Take 1 tablet by mouth daily.      . diclofenac (VOLTAREN) 75 MG EC tablet Take 75 mg by mouth daily.      . Eszopiclone (ESZOPICLONE) 3 MG TABS Take 3 mg by mouth at bedtime. Take immediately before bedtime      . ezetimibe (ZETIA) 10 MG tablet Take 10 mg by mouth daily.      Marland Kitchen ibandronate (BONIVA) 150 MG tablet Take 150 mg by mouth every 30 (thirty) days. Take in the morning with a full glass of water, on an empty stomach, and do not take anything else by mouth or lie down for the next 30 min.      . isometheptene-acetaminophen-dichloralphenazone (MIDRIN) 65-325-100 MG capsule Take 1 capsule by mouth 4 (four) times daily as needed. Maximum 5 capsules in 12 hours for migraine  headaches, 8 capsules in 24 hours for tension headaches.      Marland Kitchen lisinopril (PRINIVIL,ZESTRIL) 10 MG tablet Take 10 mg by mouth daily.      . Magnesium 250 MG TABS Take by mouth as directed.      . Multiple Vitamins-Minerals (MULTIVITAMIN WITH MINERALS) tablet Take 1 tablet by mouth daily.      . Omega-3 Fatty Acids (FISH OIL) 1000 MG CAPS Take by mouth as directed.       No current facility-administered medications on file prior to visit.    Review of Systems:  As per HPI, otherwise negative.    Physical Examination: Filed Vitals:   02/23/14 1042  BP: 126/78  Pulse: 91  Temp: 98.2 F (36.8 C)  Resp: 17   Filed Vitals:   02/23/14 1042  Height: 5\' 5"  (1.651 m)  Weight: 152 lb (68.947 kg)   Body mass index is 25.29 kg/(m^2). Ideal Body Weight: Weight in (lb) to have BMI = 25: 149.9  GEN: WDWN, NAD, Non-toxic, A & O x 3 HEENT: Atraumatic, Normocephalic. Neck supple. No masses, No LAD. Ears and Nose: No external deformity. CV: RRR, No M/G/R. No JVD. No thrill. No extra  heart sounds. PULM: CTA B, no wheezes, crackles, rhonchi. No retractions. No resp. distress. No accessory muscle use. ABD: S, NT, ND, +BS. No rebound. No HSM. EXTR: No c/c/e NEURO Normal gait.  PSYCH: Normally interactive. Conversant. Not depressed or anxious appearing.  Calm demeanor.    Assessment and Plan: Bronchitis Tessalon  Signed,  Ellison Carwin, MD

## 2014-02-23 NOTE — Patient Instructions (Signed)

## 2014-03-05 ENCOUNTER — Encounter: Payer: Self-pay | Admitting: *Deleted

## 2014-03-10 DIAGNOSIS — M1710 Unilateral primary osteoarthritis, unspecified knee: Secondary | ICD-10-CM | POA: Diagnosis not present

## 2014-03-10 DIAGNOSIS — M2140 Flat foot [pes planus] (acquired), unspecified foot: Secondary | ICD-10-CM | POA: Diagnosis not present

## 2014-03-10 DIAGNOSIS — M25569 Pain in unspecified knee: Secondary | ICD-10-CM | POA: Diagnosis not present

## 2014-03-10 DIAGNOSIS — IMO0002 Reserved for concepts with insufficient information to code with codable children: Secondary | ICD-10-CM | POA: Diagnosis not present

## 2014-03-10 DIAGNOSIS — M25579 Pain in unspecified ankle and joints of unspecified foot: Secondary | ICD-10-CM | POA: Diagnosis not present

## 2014-03-11 ENCOUNTER — Ambulatory Visit
Admission: RE | Admit: 2014-03-11 | Discharge: 2014-03-11 | Disposition: A | Discharge status: Home / Self Care | Payer: Medicare Other | Source: Ambulatory Visit

## 2014-03-11 DIAGNOSIS — Z1231 Encounter for screening mammogram for malignant neoplasm of breast: Secondary | ICD-10-CM | POA: Diagnosis not present

## 2014-03-23 ENCOUNTER — Encounter: Payer: Medicare Other | Admitting: Family Medicine

## 2014-03-24 ENCOUNTER — Ambulatory Visit (INDEPENDENT_AMBULATORY_CARE_PROVIDER_SITE_OTHER): Payer: Medicare Other | Admitting: Family Medicine

## 2014-03-24 ENCOUNTER — Encounter: Payer: Self-pay | Admitting: Family Medicine

## 2014-03-24 VITALS — BP 132/80 | HR 74 | Ht 63.0 in | Wt 144.0 lb

## 2014-03-24 DIAGNOSIS — M79609 Pain in unspecified limb: Secondary | ICD-10-CM | POA: Diagnosis not present

## 2014-03-24 DIAGNOSIS — M79672 Pain in left foot: Principal | ICD-10-CM

## 2014-03-24 DIAGNOSIS — M2140 Flat foot [pes planus] (acquired), unspecified foot: Secondary | ICD-10-CM | POA: Diagnosis not present

## 2014-03-24 DIAGNOSIS — M79671 Pain in right foot: Secondary | ICD-10-CM

## 2014-03-24 DIAGNOSIS — S9303XA Subluxation of unspecified ankle joint, initial encounter: Secondary | ICD-10-CM

## 2014-03-25 DIAGNOSIS — R252 Cramp and spasm: Secondary | ICD-10-CM | POA: Diagnosis not present

## 2014-03-30 ENCOUNTER — Ambulatory Visit: Payer: Medicare Other | Admitting: Family Medicine

## 2014-03-31 ENCOUNTER — Encounter: Payer: Self-pay | Admitting: Family Medicine

## 2014-03-31 DIAGNOSIS — M79672 Pain in left foot: Principal | ICD-10-CM

## 2014-03-31 DIAGNOSIS — M79671 Pain in right foot: Secondary | ICD-10-CM | POA: Insufficient documentation

## 2014-03-31 DIAGNOSIS — S9303XA Subluxation of unspecified ankle joint, initial encounter: Secondary | ICD-10-CM | POA: Insufficient documentation

## 2014-03-31 DIAGNOSIS — M2140 Flat foot [pes planus] (acquired), unspecified foot: Secondary | ICD-10-CM | POA: Insufficient documentation

## 2014-03-31 NOTE — Assessment & Plan Note (Signed)
new orthotics made today with metatarsal pads.  Felt very comfortable here in the office.  She will consider coming back for another pair to be made.  Patient was fitted for a : standard, cushioned, semi-rigid orthotic. The orthotic was heated and afterward the patient stood on the orthotic blank positioned on the orthotic stand. The patient was positioned in subtalar neutral position and 10 degrees of ankle dorsiflexion in a weight bearing stance. After completion of molding, a stable base was applied to the orthotic blank. The blank was ground to a stable position for weight bearing. Size: 8 Base: blue med density eva Posting: none Additional orthotic padding: small MT pads Total prep time 45 minutes

## 2014-03-31 NOTE — Progress Notes (Signed)
Patient ID: Renee Zuniga, female   DOB: 1931-07-26, 80 y.o.   MRN: [id number]  PCP: Kandice Hams, MD  Subjective:   HPI: Patient is a 80 y.o. female here for custom orthotics.  Patient was seen by Dr. Noemi Chapel and referred here for new orthotics. Has used these in the past - has two pair though one is coming apart. These are several years old. Has some sensitivity of left arch. Several years of foot pain, hammer toes, new developing left bunionette. No other complaints.  Past Medical History  Diagnosis Date  . Arthritis   . Heart murmur   . High blood pressure   . Mitral valve prolapse   . Osteoarthritis   . Vitamin D deficiency   . Migraine headache   . Hypercholesteremia   . Tinnitus   . Osteopenia   . Memory loss   . Essential hypertension, benign   . Insomnia     Current Outpatient Prescriptions on File Prior to Visit  Medication Sig Dispense Refill  . aspirin 81 MG tablet Take 81 mg by mouth daily.      . benzonatate (TESSALON) 200 MG capsule Take 1 capsule (200 mg total) by mouth 2 (two) times daily as needed for cough.  20 capsule  0  . calcium carbonate (OS-CAL - DOSED IN MG OF ELEMENTAL CALCIUM) 1250 MG tablet Take 1 tablet by mouth daily.      . diclofenac (VOLTAREN) 75 MG EC tablet Take 75 mg by mouth daily.      . Eszopiclone (ESZOPICLONE) 3 MG TABS Take 3 mg by mouth at bedtime. Take immediately before bedtime      . ezetimibe (ZETIA) 10 MG tablet Take 10 mg by mouth daily.      Marland Kitchen ibandronate (BONIVA) 150 MG tablet Take 150 mg by mouth every 30 (thirty) days. Take in the morning with a full glass of water, on an empty stomach, and do not take anything else by mouth or lie down for the next 30 min.      . isometheptene-acetaminophen-dichloralphenazone (MIDRIN) 65-325-100 MG capsule Take 1 capsule by mouth 4 (four) times daily as needed. Maximum 5 capsules in 12 hours for migraine headaches, 8 capsules in 24 hours for tension headaches.      Marland Kitchen lisinopril  (PRINIVIL,ZESTRIL) 10 MG tablet Take 10 mg by mouth daily.      . Magnesium 250 MG TABS Take by mouth as directed.      . Multiple Vitamins-Minerals (MULTIVITAMIN WITH MINERALS) tablet Take 1 tablet by mouth daily.      . Omega-3 Fatty Acids (FISH OIL) 1000 MG CAPS Take by mouth as directed.       No current facility-administered medications on file prior to visit.    Past Surgical History  Procedure Laterality Date  . Cholecystectomy    . Partial hysterectomy  1974    due to uterine prolaspe  . Gallbladder surgery    . Correction hammer toe Bilateral   . Oophorectomy      benign tumor, per patient    Allergies  Allergen Reactions  . Demerol [Meperidine]     Sick on stomach   . Lunesta [Eszopiclone]     Speech     History   Social History  . Marital Status: Married    Spouse Name: Timmothy Sours    Number of Children: 5  . Years of Education: college   Occupational History  .      Retired  Social History Main Topics  . Smoking status: Never Smoker   . Smokeless tobacco: Never Used  . Alcohol Use: 0.6 oz/week    1 Glasses of wine per week     Comment: with dinner  . Drug Use: No  . Sexual Activity: Not Currently   Other Topics Concern  . Not on file   Social History Narrative   Patient is retired and lives at home with her husband Timmothy Sours). Patient has college education.    Caffeine- tea - four glasses.   Right handed.    Family History  Problem Relation Age of Onset  . Mitral valve prolapse Father   . Heart Problems Mother   . Breast cancer Mother   . Hypertension Mother     BP 132/80  Pulse 74  Ht 5\' 3"  (1.6 m)  Wt 144 lb (65.318 kg)  BMI 25.51 kg/m2  Review of Systems: See HPI above.    Objective:  Physical Exam:  Gen: NAD  Bilateral feet/ankles: No leg length inequality. Ankle subluxation left more than right. Hammering of bilateral 2nd-4th digits, transverse arch collapse. Hallux rigidus. Small bunionette left 5th MTP. Severe  overpronation. Mild tenderness within left arch and 2nd metatarsal plantar area.    Assessment & Plan:  1. Bilateral foot pain - new orthotics made today with metatarsal pads.  Felt very comfortable here in the office.  She will consider coming back for another pair to be made.  Patient was fitted for a : standard, cushioned, semi-rigid orthotic. The orthotic was heated and afterward the patient stood on the orthotic blank positioned on the orthotic stand. The patient was positioned in subtalar neutral position and 10 degrees of ankle dorsiflexion in a weight bearing stance. After completion of molding, a stable base was applied to the orthotic blank. The blank was ground to a stable position for weight bearing. Size: 8 Base: blue med density eva Posting: none Additional orthotic padding: small MT pads Total prep time 45 minutes

## 2014-05-09 DIAGNOSIS — M25559 Pain in unspecified hip: Secondary | ICD-10-CM | POA: Diagnosis not present

## 2014-05-27 DIAGNOSIS — R609 Edema, unspecified: Secondary | ICD-10-CM | POA: Diagnosis not present

## 2014-05-27 DIAGNOSIS — J040 Acute laryngitis: Secondary | ICD-10-CM | POA: Diagnosis not present

## 2014-05-28 DIAGNOSIS — J069 Acute upper respiratory infection, unspecified: Secondary | ICD-10-CM | POA: Diagnosis not present

## 2014-05-28 DIAGNOSIS — M25579 Pain in unspecified ankle and joints of unspecified foot: Secondary | ICD-10-CM | POA: Diagnosis not present

## 2014-05-28 DIAGNOSIS — M2140 Flat foot [pes planus] (acquired), unspecified foot: Secondary | ICD-10-CM | POA: Diagnosis not present

## 2014-07-06 DIAGNOSIS — M25579 Pain in unspecified ankle and joints of unspecified foot: Secondary | ICD-10-CM | POA: Diagnosis not present

## 2014-07-13 DIAGNOSIS — M545 Low back pain, unspecified: Secondary | ICD-10-CM | POA: Diagnosis not present

## 2014-07-21 DIAGNOSIS — Q6650 Congenital pes planus, unspecified foot: Secondary | ICD-10-CM | POA: Diagnosis not present

## 2014-07-21 DIAGNOSIS — M25579 Pain in unspecified ankle and joints of unspecified foot: Secondary | ICD-10-CM | POA: Diagnosis not present

## 2014-08-11 DIAGNOSIS — D1801 Hemangioma of skin and subcutaneous tissue: Secondary | ICD-10-CM | POA: Diagnosis not present

## 2014-08-11 DIAGNOSIS — L570 Actinic keratosis: Secondary | ICD-10-CM | POA: Diagnosis not present

## 2014-08-11 DIAGNOSIS — L819 Disorder of pigmentation, unspecified: Secondary | ICD-10-CM | POA: Diagnosis not present

## 2014-08-11 DIAGNOSIS — L821 Other seborrheic keratosis: Secondary | ICD-10-CM | POA: Diagnosis not present

## 2014-08-11 DIAGNOSIS — D692 Other nonthrombocytopenic purpura: Secondary | ICD-10-CM | POA: Diagnosis not present

## 2014-08-12 DIAGNOSIS — M25579 Pain in unspecified ankle and joints of unspecified foot: Secondary | ICD-10-CM | POA: Diagnosis not present

## 2014-08-12 DIAGNOSIS — Q6650 Congenital pes planus, unspecified foot: Secondary | ICD-10-CM | POA: Diagnosis not present

## 2014-08-18 DIAGNOSIS — N390 Urinary tract infection, site not specified: Secondary | ICD-10-CM | POA: Diagnosis not present

## 2014-08-18 DIAGNOSIS — N8111 Cystocele, midline: Secondary | ICD-10-CM | POA: Diagnosis not present

## 2014-08-19 DIAGNOSIS — M25579 Pain in unspecified ankle and joints of unspecified foot: Secondary | ICD-10-CM | POA: Diagnosis not present

## 2014-08-19 DIAGNOSIS — Q6650 Congenital pes planus, unspecified foot: Secondary | ICD-10-CM | POA: Diagnosis not present

## 2014-08-25 DIAGNOSIS — M899 Disorder of bone, unspecified: Secondary | ICD-10-CM | POA: Diagnosis not present

## 2014-08-25 DIAGNOSIS — E559 Vitamin D deficiency, unspecified: Secondary | ICD-10-CM | POA: Diagnosis not present

## 2014-08-25 DIAGNOSIS — Z0000 Encounter for general adult medical examination without abnormal findings: Secondary | ICD-10-CM | POA: Diagnosis not present

## 2014-08-25 DIAGNOSIS — M25579 Pain in unspecified ankle and joints of unspecified foot: Secondary | ICD-10-CM | POA: Diagnosis not present

## 2014-08-25 DIAGNOSIS — I10 Essential (primary) hypertension: Secondary | ICD-10-CM | POA: Diagnosis not present

## 2014-08-25 DIAGNOSIS — R413 Other amnesia: Secondary | ICD-10-CM | POA: Diagnosis not present

## 2014-08-25 DIAGNOSIS — Q6650 Congenital pes planus, unspecified foot: Secondary | ICD-10-CM | POA: Diagnosis not present

## 2014-08-25 DIAGNOSIS — M949 Disorder of cartilage, unspecified: Secondary | ICD-10-CM | POA: Diagnosis not present

## 2014-08-25 DIAGNOSIS — E785 Hyperlipidemia, unspecified: Secondary | ICD-10-CM | POA: Diagnosis not present

## 2014-08-25 DIAGNOSIS — G479 Sleep disorder, unspecified: Secondary | ICD-10-CM | POA: Diagnosis not present

## 2014-08-25 DIAGNOSIS — Z23 Encounter for immunization: Secondary | ICD-10-CM | POA: Diagnosis not present

## 2014-08-25 DIAGNOSIS — N8111 Cystocele, midline: Secondary | ICD-10-CM | POA: Diagnosis not present

## 2014-08-25 DIAGNOSIS — N952 Postmenopausal atrophic vaginitis: Secondary | ICD-10-CM | POA: Diagnosis not present

## 2014-09-02 DIAGNOSIS — Q6650 Congenital pes planus, unspecified foot: Secondary | ICD-10-CM | POA: Diagnosis not present

## 2014-09-02 DIAGNOSIS — M25579 Pain in unspecified ankle and joints of unspecified foot: Secondary | ICD-10-CM | POA: Diagnosis not present

## 2014-09-07 DIAGNOSIS — N8111 Cystocele, midline: Secondary | ICD-10-CM | POA: Diagnosis not present

## 2014-09-09 DIAGNOSIS — M25579 Pain in unspecified ankle and joints of unspecified foot: Secondary | ICD-10-CM | POA: Diagnosis not present

## 2014-09-09 DIAGNOSIS — Q6650 Congenital pes planus, unspecified foot: Secondary | ICD-10-CM | POA: Diagnosis not present

## 2014-09-10 DIAGNOSIS — M25571 Pain in right ankle and joints of right foot: Secondary | ICD-10-CM | POA: Diagnosis not present

## 2014-09-10 DIAGNOSIS — M25561 Pain in right knee: Secondary | ICD-10-CM | POA: Diagnosis not present

## 2014-09-10 DIAGNOSIS — M25572 Pain in left ankle and joints of left foot: Secondary | ICD-10-CM | POA: Diagnosis not present

## 2014-09-16 DIAGNOSIS — M13872 Other specified arthritis, left ankle and foot: Secondary | ICD-10-CM | POA: Diagnosis not present

## 2014-09-16 DIAGNOSIS — Q6652 Congenital pes planus, left foot: Secondary | ICD-10-CM | POA: Diagnosis not present

## 2014-09-16 DIAGNOSIS — M25572 Pain in left ankle and joints of left foot: Secondary | ICD-10-CM | POA: Diagnosis not present

## 2014-09-16 DIAGNOSIS — M6281 Muscle weakness (generalized): Secondary | ICD-10-CM | POA: Diagnosis not present

## 2014-09-21 ENCOUNTER — Ambulatory Visit
Admit: 2014-09-21 | Discharge: 2014-09-21 | Disposition: A | Discharge status: Home / Self Care | Payer: Self-pay | Attending: Emergency Medicine | Admitting: Emergency Medicine

## 2014-09-21 DIAGNOSIS — Z23 Encounter for immunization: Secondary | ICD-10-CM

## 2014-09-21 DIAGNOSIS — N390 Urinary tract infection, site not specified: Secondary | ICD-10-CM

## 2014-09-21 DIAGNOSIS — R350 Frequency of micturition: Secondary | ICD-10-CM | POA: Diagnosis not present

## 2014-09-21 DIAGNOSIS — R300 Dysuria: Secondary | ICD-10-CM | POA: Diagnosis not present

## 2014-09-21 HISTORY — DX: Unspecified osteoarthritis, unspecified site: M19.90

## 2014-09-21 HISTORY — DX: Nonrheumatic mitral (valve) prolapse: I34.1

## 2014-09-21 LAB — POCT URINALYSIS DIPSTICK
Blood,UA POCT: NEGATIVE
Glucose,UA POCT: NORMAL
Ketones,UA POCT: NEGATIVE
Lot #: 410088
Nitrite,UA POCT: NEGATIVE
PH,UA POCT: 6 (ref 5–8)
Protein,UA POCT: NEGATIVE mg/dL
Specific gravity,UA POCT: 1.015 (ref 1.002–1.03)
Urobilinogen,UA: NORMAL

## 2014-09-21 LAB — HM HIV SCREENING OFFERED

## 2014-09-21 MED ORDER — NITROFURANTOIN MONOHYD MACRO 100 MG PO CAPS *I*
100.0000 mg | ORAL_CAPSULE | Freq: Two times a day (BID) | ORAL | Status: AC
Start: 2014-09-21 — End: 2014-09-26

## 2014-09-21 NOTE — ED Notes (Signed)
frequency and urgency x 3-4 days. Denies fever or pain

## 2014-09-21 NOTE — UC Provider Note (Signed)
History     Chief Complaint   Patient presents with    Urinary Tract Infection     frequency and urgency x 3-4 days. Denies fever or pain     Patient is a 80 y.o. female presenting with dysuria.   History provided by:  Patient  Dysuria  Pain quality:  Burning  Pain severity:  Mild  Onset quality:  Sudden  Duration:  3 days  Timing:  Intermittent  Progression:  Worsening  Recent urinary tract infections: no    Relieved by:  Nothing  Worsened by:  Nothing tried  Ineffective treatments:  None tried  Urinary symptoms: frequent urination    Urinary symptoms: no foul-smelling urine, no hematuria, no hesitancy and no bladder incontinence    Associated symptoms: no abdominal pain, no fever, no flank pain, no nausea, no vaginal discharge and no vomiting        Past Medical History   Diagnosis Date    Arthritis     Mitral prolapse             Past Surgical History   Procedure Laterality Date    Hysterectomy      Cholecystectomy         History reviewed. No pertinent family history.      Social History      reports that she has never smoked. She does not have any smokeless tobacco history on file. She reports that she drinks alcohol. She reports that she does not use illicit drugs. Her sexual activity history is not on file.    Living Situation     Questions Responses    Patient lives with     Homeless     Caregiver for other family member     External Services     Employment     Domestic Violence Risk           Review of Systems   Review of Systems   Constitutional: Negative.  Negative for fever.   HENT: Negative for sore throat.    Eyes: Negative for redness.   Respiratory: Negative for cough.    Cardiovascular: Negative for chest pain.   Gastrointestinal: Negative for nausea, vomiting, abdominal pain and diarrhea.   Genitourinary: Positive for dysuria, urgency and frequency. Negative for flank pain and vaginal discharge.   Musculoskeletal: Negative.    Skin: Negative for rash.   Neurological: Negative for weakness  and numbness.   Hematological: Does not bruise/bleed easily.   Psychiatric/Behavioral: Negative.        Physical Exam     ED Triage Vitals   BP Heart Rate Heart Rate(via Pulse Ox) Resp Temp Temp Source SpO2 O2 Device O2 Flow Rate   09/21/14 1708 09/21/14 1708 -- 09/21/14 1708 09/21/14 1708 09/21/14 1708 09/21/14 1708 09/21/14 1708 --   151/92 mmHg 89  16 36.1 C (97 F) Oral 96 % None (Room air)       Weight           09/21/14 1708           68.04 kg (150 lb)               Physical Exam   Constitutional: She is oriented to person, place, and time. She appears well-developed and well-nourished.   HENT:   Head: Normocephalic and atraumatic.   Mouth/Throat: Oropharynx is clear and moist.   Eyes: Conjunctivae are normal.   Neck: Neck supple.   Cardiovascular: Normal rate and regular rhythm.  Pulmonary/Chest: Effort normal. No respiratory distress.   Abdominal: Soft. There is no tenderness. There is no rebound and no guarding.   Musculoskeletal: Normal range of motion.   Neurological: She is alert and oriented to person, place, and time.   Skin: Skin is warm and dry.   Psychiatric: She has a normal mood and affect. Her behavior is normal.   Nursing note and vitals reviewed.      Medical Decision Making        Initial Evaluation:  ED First Provider Contact     Date/Time Event User Comments    09/21/14 1759 ED Provider First Contact Berdine Rasmusson, Eston MouldRACEY QUAIL Initial Face to Face Provider Contact          Patient seen by me as above    Assessment:  80 y.o., female [facility] with urinary freq and dysuria requesting flu shot                 Plan: flu shot given, macrobid, urine cx, Follow up PCP or return to clinic as needed.       Caroline Moreracey Q Luz Mares, MD

## 2014-09-23 LAB — AEROBIC CULTURE: Aerobic Culture: 0

## 2014-10-06 DIAGNOSIS — M25572 Pain in left ankle and joints of left foot: Secondary | ICD-10-CM | POA: Diagnosis not present

## 2014-10-06 DIAGNOSIS — M6281 Muscle weakness (generalized): Secondary | ICD-10-CM | POA: Diagnosis not present

## 2014-10-06 DIAGNOSIS — M13872 Other specified arthritis, left ankle and foot: Secondary | ICD-10-CM | POA: Diagnosis not present

## 2014-10-06 DIAGNOSIS — Z79899 Other long term (current) drug therapy: Secondary | ICD-10-CM | POA: Diagnosis not present

## 2014-10-19 DIAGNOSIS — M25572 Pain in left ankle and joints of left foot: Secondary | ICD-10-CM | POA: Diagnosis not present

## 2014-10-19 DIAGNOSIS — M13872 Other specified arthritis, left ankle and foot: Secondary | ICD-10-CM | POA: Diagnosis not present

## 2014-10-20 DIAGNOSIS — L570 Actinic keratosis: Secondary | ICD-10-CM | POA: Diagnosis not present

## 2014-10-20 DIAGNOSIS — L821 Other seborrheic keratosis: Secondary | ICD-10-CM | POA: Diagnosis not present

## 2014-10-26 DIAGNOSIS — M47816 Spondylosis without myelopathy or radiculopathy, lumbar region: Secondary | ICD-10-CM | POA: Diagnosis not present

## 2014-10-27 DIAGNOSIS — M1611 Unilateral primary osteoarthritis, right hip: Secondary | ICD-10-CM | POA: Diagnosis not present

## 2014-11-09 ENCOUNTER — Encounter (HOSPITAL_COMMUNITY): Payer: Self-pay | Admitting: Emergency Medicine

## 2014-11-09 ENCOUNTER — Ambulatory Visit (INDEPENDENT_AMBULATORY_CARE_PROVIDER_SITE_OTHER): Payer: Medicare Other | Admitting: Family Medicine

## 2014-11-09 ENCOUNTER — Inpatient Hospital Stay (HOSPITAL_COMMUNITY)
Admission: EM | Admit: 2014-11-09 | Discharge: 2014-11-10 | DRG: 948 | Disposition: A | Discharge status: Home / Self Care | Payer: Medicare Other | Attending: Internal Medicine | Admitting: Internal Medicine

## 2014-11-09 ENCOUNTER — Emergency Department (HOSPITAL_COMMUNITY): Payer: Medicare Other

## 2014-11-09 VITALS — BP 142/76 | HR 76 | Temp 98.5°F | Resp 16

## 2014-11-09 DIAGNOSIS — I341 Nonrheumatic mitral (valve) prolapse: Secondary | ICD-10-CM | POA: Diagnosis not present

## 2014-11-09 DIAGNOSIS — E785 Hyperlipidemia, unspecified: Secondary | ICD-10-CM | POA: Diagnosis present

## 2014-11-09 DIAGNOSIS — R4182 Altered mental status, unspecified: Secondary | ICD-10-CM | POA: Diagnosis not present

## 2014-11-09 DIAGNOSIS — R410 Disorientation, unspecified: Principal | ICD-10-CM | POA: Diagnosis present

## 2014-11-09 DIAGNOSIS — I10 Essential (primary) hypertension: Secondary | ICD-10-CM

## 2014-11-09 DIAGNOSIS — M1990 Unspecified osteoarthritis, unspecified site: Secondary | ICD-10-CM | POA: Diagnosis not present

## 2014-11-09 DIAGNOSIS — Z79899 Other long term (current) drug therapy: Secondary | ICD-10-CM | POA: Diagnosis not present

## 2014-11-09 DIAGNOSIS — F05 Delirium due to known physiological condition: Secondary | ICD-10-CM | POA: Diagnosis not present

## 2014-11-09 DIAGNOSIS — E7800 Pure hypercholesterolemia, unspecified: Secondary | ICD-10-CM | POA: Diagnosis present

## 2014-11-09 DIAGNOSIS — H9319 Tinnitus, unspecified ear: Secondary | ICD-10-CM | POA: Diagnosis present

## 2014-11-09 DIAGNOSIS — Z7983 Long term (current) use of bisphosphonates: Secondary | ICD-10-CM

## 2014-11-09 DIAGNOSIS — E559 Vitamin D deficiency, unspecified: Secondary | ICD-10-CM | POA: Diagnosis present

## 2014-11-09 DIAGNOSIS — I493 Ventricular premature depolarization: Secondary | ICD-10-CM

## 2014-11-09 DIAGNOSIS — G4700 Insomnia, unspecified: Secondary | ICD-10-CM | POA: Diagnosis not present

## 2014-11-09 DIAGNOSIS — R42 Dizziness and giddiness: Secondary | ICD-10-CM | POA: Diagnosis not present

## 2014-11-09 DIAGNOSIS — R404 Transient alteration of awareness: Secondary | ICD-10-CM | POA: Diagnosis not present

## 2014-11-09 DIAGNOSIS — M8580 Other specified disorders of bone density and structure, unspecified site: Secondary | ICD-10-CM | POA: Diagnosis present

## 2014-11-09 DIAGNOSIS — G43909 Migraine, unspecified, not intractable, without status migrainosus: Secondary | ICD-10-CM | POA: Diagnosis present

## 2014-11-09 DIAGNOSIS — G459 Transient cerebral ischemic attack, unspecified: Secondary | ICD-10-CM | POA: Diagnosis present

## 2014-11-09 DIAGNOSIS — G43009 Migraine without aura, not intractable, without status migrainosus: Secondary | ICD-10-CM | POA: Diagnosis not present

## 2014-11-09 DIAGNOSIS — Z7982 Long term (current) use of aspirin: Secondary | ICD-10-CM | POA: Diagnosis not present

## 2014-11-09 DIAGNOSIS — R569 Unspecified convulsions: Secondary | ICD-10-CM | POA: Diagnosis present

## 2014-11-09 HISTORY — DX: Essential (primary) hypertension: I10

## 2014-11-09 HISTORY — DX: Transient cerebral ischemic attack, unspecified: G45.9

## 2014-11-09 LAB — I-STAT TROPONIN, ED: Troponin i, poc: 0 ng/mL (ref 0.00–0.08)

## 2014-11-09 LAB — COMPREHENSIVE METABOLIC PANEL
ALT: 23 U/L (ref 0–35)
AST: 26 U/L (ref 0–37)
Albumin: 4 g/dL (ref 3.5–5.2)
Alkaline Phosphatase: 55 U/L (ref 39–117)
Anion gap: 13 (ref 5–15)
BUN: 28 mg/dL — ABNORMAL HIGH (ref 6–23)
CO2: 23 mEq/L (ref 19–32)
Calcium: 10.3 mg/dL (ref 8.4–10.5)
Chloride: 102 mEq/L (ref 96–112)
Creatinine, Ser: 0.9 mg/dL (ref 0.50–1.10)
GFR calc Af Amer: 68 mL/min — ABNORMAL LOW (ref >90)
GFR calc non Af Amer: 58 mL/min — ABNORMAL LOW (ref >90)
Glucose, Bld: 99 mg/dL (ref 70–99)
Potassium: 5.2 mEq/L (ref 3.7–5.3)
Sodium: 138 mEq/L (ref 137–147)
Total Bilirubin: 0.2 mg/dL — ABNORMAL LOW (ref 0.3–1.2)
Total Protein: 7.2 g/dL (ref 6.0–8.3)

## 2014-11-09 LAB — CBC WITH DIFFERENTIAL/PLATELET
Basophils Absolute: 0 10*3/uL (ref 0.0–0.1)
Basophils Relative: 1 % (ref 0–1)
Eosinophils Absolute: 0.1 10*3/uL (ref 0.0–0.7)
Eosinophils Relative: 2 % (ref 0–5)
HCT: 39.3 % (ref 36.0–46.0)
Hemoglobin: 13.1 g/dL (ref 12.0–15.0)
Lymphocytes Relative: 37 % (ref 12–46)
Lymphs Abs: 1.9 10*3/uL (ref 0.7–4.0)
MCH: 31.6 pg (ref 26.0–34.0)
MCHC: 33.3 g/dL (ref 30.0–36.0)
MCV: 94.7 fL (ref 78.0–100.0)
Monocytes Absolute: 0.5 10*3/uL (ref 0.1–1.0)
Monocytes Relative: 10 % (ref 3–12)
Neutro Abs: 2.6 10*3/uL (ref 1.7–7.7)
Neutrophils Relative %: 50 % (ref 43–77)
Platelets: 295 10*3/uL (ref 150–400)
RBC: 4.15 MIL/uL (ref 3.87–5.11)
RDW: 14.8 % (ref 11.5–15.5)
WBC: 5.2 10*3/uL (ref 4.0–10.5)

## 2014-11-09 LAB — PROTIME-INR
INR: 1.04 (ref 0.00–1.49)
Prothrombin Time: 13.7 seconds (ref 11.6–15.2)

## 2014-11-09 LAB — GLUCOSE, POCT (MANUAL RESULT ENTRY): POC Glucose: 113 mg/dl — AB (ref 70–99)

## 2014-11-09 MED ORDER — LORAZEPAM 2 MG/ML IJ SOLN
1.0000 mg | Freq: Once | INTRAMUSCULAR | Status: AC
  Administered 2014-11-09: 1 mg via INTRAVENOUS
  Filled 2014-11-09: qty 1

## 2014-11-09 MED ORDER — VITAMIN E 180 MG (400 UNIT) PO CAPS
400.0000 [IU] | ORAL_CAPSULE | Freq: Every day | ORAL | Status: DC
  Administered 2014-11-10: 400 [IU] via ORAL
  Filled 2014-11-09: qty 1

## 2014-11-09 MED ORDER — SENNOSIDES-DOCUSATE SODIUM 8.6-50 MG PO TABS: 1.0000 | ORAL_TABLET | Freq: Every evening | ORAL | Status: DC | PRN

## 2014-11-09 MED ORDER — ADULT MULTIVITAMIN W/MINERALS CH
1.0000 | ORAL_TABLET | Freq: Every day | ORAL | Status: DC
  Administered 2014-11-10: 1 via ORAL
  Filled 2014-11-09 (×2): qty 1

## 2014-11-09 MED ORDER — HEPARIN SODIUM (PORCINE) 5000 UNIT/ML IJ SOLN
5000.0000 [IU] | Freq: Three times a day (TID) | INTRAMUSCULAR | Status: DC
  Administered 2014-11-10: 5000 [IU] via SUBCUTANEOUS
  Filled 2014-11-09 (×2): qty 1

## 2014-11-09 MED ORDER — PRAMIPEXOLE DIHYDROCHLORIDE 0.25 MG PO TABS
0.2500 mg | ORAL_TABLET | Freq: Every day | ORAL | Status: DC
  Filled 2014-11-09 (×2): qty 1

## 2014-11-09 MED ORDER — OCUVITE PO TABS
1.0000 | ORAL_TABLET | Freq: Every day | ORAL | Status: DC
  Administered 2014-11-10: 1 via ORAL
  Filled 2014-11-09: qty 1

## 2014-11-09 MED ORDER — MAGNESIUM OXIDE 400 (241.3 MG) MG PO TABS
400.0000 mg | ORAL_TABLET | Freq: Every day | ORAL | Status: DC
  Administered 2014-11-10: 400 mg via ORAL
  Filled 2014-11-09: qty 1

## 2014-11-09 MED ORDER — ASPIRIN 300 MG RE SUPP: 300.0000 mg | Freq: Every day | RECTAL | Status: DC

## 2014-11-09 MED ORDER — OMEGA-3-ACID ETHYL ESTERS 1 G PO CAPS
1.0000 g | ORAL_CAPSULE | Freq: Every day | ORAL | Status: DC
  Administered 2014-11-10: 1 g via ORAL
  Filled 2014-11-09: qty 1

## 2014-11-09 MED ORDER — ASPIRIN 325 MG PO TABS
325.0000 mg | ORAL_TABLET | Freq: Every day | ORAL | Status: DC
  Administered 2014-11-10: 325 mg via ORAL
  Filled 2014-11-09: qty 1

## 2014-11-09 MED ORDER — CALCIUM CARBONATE 1250 (500 CA) MG PO TABS
1.0000 | ORAL_TABLET | Freq: Every day | ORAL | Status: DC
  Administered 2014-11-10: 500 mg via ORAL
  Filled 2014-11-09: qty 1

## 2014-11-09 MED ORDER — DICLOFENAC SODIUM 1 % TD GEL
2.0000 g | Freq: Three times a day (TID) | TRANSDERMAL | Status: DC
  Administered 2014-11-10: 2 g via TOPICAL
  Filled 2014-11-09: qty 100

## 2014-11-09 MED ORDER — CHLORDIAZEPOXIDE HCL 5 MG PO CAPS
10.0000 mg | ORAL_CAPSULE | Freq: Every day | ORAL | Status: DC
  Filled 2014-11-09: qty 2

## 2014-11-09 MED ORDER — STROKE: EARLY STAGES OF RECOVERY BOOK
Freq: Once | Status: AC
  Administered 2014-11-10: 12:00:00
  Filled 2014-11-09 (×2): qty 1

## 2014-11-09 NOTE — ED Provider Notes (Signed)
CSN: [id number]     Arrival date & time 11/09/14  1743 History   First MD Initiated Contact with Patient 11/09/14 1744     Chief Complaint  Patient presents with  . Altered Mental Status     (Consider location/radiation/quality/duration/timing/severity/associated sxs/prior Treatment) HPI Comments: Patient is an 80 year old female with history of hypertension, hypercholesterolemia, arthritis. She presents today with complaints of confusion. She woke up this morning feeling well and in her normal state of health. This afternoon after returning from the grocery store her husband noticed that she was confused and having difficulty finding words. She is apparently fumbling with the car keys and was uncertain as to what these were used for. Husband states that she was unable to speak to words that she wanted to speak. This lasted for approximately 2 hours and has since resolved. She was taken to urgent care where she was evaluated and sent here for further workup. Patient is somewhat annoyed by being in the ER and feels as though her family has overreacted. She currently has no complaints.  Patient is a 80 y.o. female presenting with altered mental status. The history is provided by the patient.  Altered Mental Status Presenting symptoms: confusion   Severity:  Moderate Most recent episode:  Today Episode history:  Single Duration:  2 hours Timing:  Constant Progression:  Resolved Chronicity:  New Context: not alcohol use, not dementia and not head injury     Past Medical History  Diagnosis Date  . Arthritis   . Heart murmur   . High blood pressure   . Mitral valve prolapse   . Osteoarthritis   . Vitamin D deficiency   . Migraine headache   . Hypercholesteremia   . Tinnitus   . Osteopenia   . Memory loss   . Essential hypertension, benign   . Insomnia    Past Surgical History  Procedure Laterality Date  . Cholecystectomy    . Partial hysterectomy  1974    due to uterine  prolaspe  . Gallbladder surgery    . Correction hammer toe Bilateral   . Oophorectomy      benign tumor, per patient   Family History  Problem Relation Age of Onset  . Mitral valve prolapse Father   . Heart Problems Mother   . Breast cancer Mother   . Hypertension Mother    History  Substance Use Topics  . Smoking status: Never Smoker   . Smokeless tobacco: Never Used  . Alcohol Use: 0.6 oz/week    1 Glasses of wine per week     Comment: with dinner   OB History    No data available     Review of Systems  Psychiatric/Behavioral: Positive for confusion.  All other systems reviewed and are negative.     Allergies  Demerol and Lunesta  Home Medications   Prior to Admission medications   Medication Sig Start Date End Date Taking? Authorizing Provider  aspirin 81 MG tablet Take 81 mg by mouth daily.    Historical Provider, MD  calcium carbonate (OS-CAL - DOSED IN MG OF ELEMENTAL CALCIUM) 1250 MG tablet Take 1 tablet by mouth daily.    Historical Provider, MD  diclofenac (VOLTAREN) 75 MG EC tablet Take 75 mg by mouth daily.    Historical Provider, MD  ibandronate (BONIVA) 150 MG tablet Take 150 mg by mouth every 30 (thirty) days. Take in the morning with a full glass of water, on an empty stomach, and do not  take anything else by mouth or lie down for the next 30 min.    Historical Provider, MD  isometheptene-acetaminophen-dichloralphenazone (MIDRIN) 65-325-100 MG capsule Take 1 capsule by mouth 4 (four) times daily as needed. Maximum 5 capsules in 12 hours for migraine headaches, 8 capsules in 24 hours for tension headaches.    Historical Provider, MD  lisinopril (PRINIVIL,ZESTRIL) 10 MG tablet Take 10 mg by mouth daily.    Historical Provider, MD  Magnesium 250 MG TABS Take by mouth as directed.    Historical Provider, MD  Multiple Vitamins-Minerals (MULTIVITAMIN WITH MINERALS) tablet Take 1 tablet by mouth daily.    Historical Provider, MD  Omega-3 Fatty Acids (FISH OIL)  1000 MG CAPS Take by mouth as directed.    Historical Provider, MD   BP 138/72 mmHg  Pulse 82  Temp(Src) 98.1 F (36.7 C) (Oral)  Resp 17  SpO2 98% Physical Exam  Constitutional: She is oriented to person, place, and time. She appears well-developed and well-nourished. No distress.  HENT:  Head: Normocephalic and atraumatic.  Mouth/Throat: Oropharynx is clear and moist.  Eyes: EOM are normal. Pupils are equal, round, and reactive to light.  Neck: Normal range of motion. Neck supple.  Cardiovascular: Normal rate and regular rhythm.  Exam reveals no gallop and no friction rub.   No murmur heard. Pulmonary/Chest: Effort normal and breath sounds normal. No respiratory distress. She has no wheezes.  Abdominal: Soft. Bowel sounds are normal. She exhibits no distension. There is no tenderness.  Musculoskeletal: Normal range of motion.  Neurological: She is alert and oriented to person, place, and time. No cranial nerve deficit. She exhibits normal muscle tone. Coordination normal.  Skin: Skin is warm and dry. She is not diaphoretic.  Nursing note and vitals reviewed.   ED Course  Procedures (including critical care time) Labs Review Labs Reviewed - No data to display  Imaging Review No results found.   EKG Interpretation   Date/Time:  Monday November 09 2014 17:51:29 EST Ventricular Rate:  86 PR Interval:  173 QRS Duration: 90 QT Interval:  388 QTC Calculation: 464 R Axis:   29 Text Interpretation:  Sinus rhythm Baseline wander in lead(s) II III aVF  Normal ECG Confirmed by DELOS  MD, Deserea Bordley ([id number]) on 11/09/2014 6:07:38  PM      MDM   Final diagnoses:  None    Consent for evaluation of confusion and expressive aphasia that occurred this afternoon and lasted for approximately 2 hours. Workup initiated in the ER reveals no significant laboratory abnormalities and MRI reveals no evidence for stroke. She was evaluated by Dr. Leonel Ramsay from neurology. He is concerned  she experienced some sort of neurologic event, whether a seizure or TIA and feels as though she requires further workup. I have discussed the case with the hospitalist agrees to admit.    Veryl Speak, MD 11/10/14 [phone]

## 2014-11-09 NOTE — Consult Note (Signed)
Neurology Consultation Reason for Consult: Confusion Referring Physician: Stark Jock, D  CC: Confusion  History is obtained from: Patient, family  HPI: Renee Zuniga is a 80 y.o. female with a history of 3 episodes of transient confusion. Her husband describes the events as being like a "lightswitch" goes off and she is no longer herself. Today, in the event lasted somewhere between 30 minutes and 2 hours depending on if you asked the patient or her daughters. She subjectively feels like she has intact memory of the event. She apparently had sudden onset of confusion as evidenced by perseveration "I don't know what I need to do, I don't know what I need to do, I don't know what I need to do" as well as difficulty with routine tasks (wrapping a present) and difficulty with word finding, was trying to ask for tape and couldn't get the word out.  She has had a total of 3 episodes similar to this, with the previous episodes being attributed to Ambien. She had all the events in the middle of the day, however, and had been normal prior to the event even though she had taken Ambien the night before.  Her family has noticed some decreased memory, but the patient is adamant that her memory is just fine.   ROS: A 14 point ROS was performed and is negative except as noted in the HPI.   Past Medical History  Diagnosis Date  . Arthritis   . Heart murmur   . High blood pressure   . Mitral valve prolapse   . Osteoarthritis   . Vitamin D deficiency   . Migraine headache   . Hypercholesteremia   . Tinnitus   . Osteopenia   . Memory loss   . Essential hypertension, benign   . Insomnia     Family History: No history of seizures or memory problems  Social History: Tob: Denies  Exam: Current vital signs: BP 138/72 mmHg  Pulse 82  Temp(Src) 98.1 F (36.7 C) (Oral)  Resp 17  SpO2 98% Vital signs in last 24 hours: Temp:  [98.1 F (36.7 C)-98.5 F (36.9 C)] 98.1 F (36.7 C) (11/30  1756) Pulse Rate:  [76-82] 82 (11/30 1752) Resp:  [16-17] 17 (11/30 1752) BP: (138-142)/(72-76) 138/72 mmHg (11/30 1752) SpO2:  [98 %] 98 % (11/30 1752)  General: In bed, NAD  Physical Exam  Constitutional: appears well-developed and well-nourished.  Psych: Affect appropriate to situation Eyes: No scleral injection HENT: No OP obstrucion Head: Normocephalic.  Cardiovascular: Normal rate and regular rhythm.  Respiratory: Effort normal and breath sounds normal.  GI: Soft.  No distension. There is no tenderness.  Skin: WDI  Neuro: Mental Status: Patient is awake, alert, oriented to person, place, month, year, and situation. Immediate and remote memory are intact. Patient is able to give a clear and coherent history. No signs of aphasia or neglect Cranial Nerves: II: Visual Fields are full. Pupils are equal, round, and reactive to light.  Discs are difficult to visualize. III,IV, VI: EOMI without ptosis or diploplia.  V: Facial sensation is symmetric to temperature VII: Facial movement is symmetric.  VIII: hearing is intact to voice X: Uvula elevates symmetrically XI: Shoulder shrug is symmetric. XII: tongue is midline without atrophy or fasciculations.  Motor: Tone is normal. Bulk is normal. 5/5 strength was present in all four extremities.  Sensory: Sensation is symmetric to light touch and temperature in the arms and legs. Deep Tendon Reflexes: 2+ and symmetric in the  biceps and patellae.  Cerebellar: FNF with mild intentional tremor bilaterally      I have reviewed labs in epic and the results pertinent to this consultation are: CBC-unremarkable  I have reviewed the images obtained: Previous MRI(08/2013)-mild white matter change  Impression: 80 year old female with a history of 3 episodes of transient confusion. Differential includes TIA, partial seizures, complicated migraine. The sudden abrupt change makes me think that is less likely to be early dementia, though  this could be certainly a source of seizures. At this time, I would favor getting a TIA workup as well as an EEG.  Recommendations: 1. HgbA1c, fasting lipid panel 2. MRI, MRA  of the brain without contrast 3. Frequent neuro checks 4. Echocardiogram 5. Carotid dopplers 6. Prophylactic therapy-Antiplatelet med: Aspirin - dose 325mg  PO or 300mg  PR 7. Risk factor modification 8. Telemetry monitoring 9. EEG   Roland Rack, MD [facility] [phone]  If 7pm- 7am, please page neurology on call as listed in [place].

## 2014-11-09 NOTE — Progress Notes (Signed)
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 She came home and was going to mouth are present. She could not remember words such as scotch tape. Apparently there and a number of things she just knew but couldn't say. Her husband was concerned and called her daughters. Patient declined going to the emergency room. Her primary care doctor's office, Dr. Delfina Redwood, recommended she go to the emergency room or come here if not willing. They brought her here. She has previously had 3 other episodes like this is couple of years. They thought it might be from some anxiolytic medication she had gotten hold off, but that is not the problem this time. She did see a neurologist, Dr.Yan, who previously assessed her and got an MRA. The MRA was normal. The patient apparently refused to go back to the neurologist. That has been over a year and further follow-up has not been done. The differential included a small stroke or TIA or some other such event.  She is generally active and alert and healthy. She has had some orthopedic problems. She is ambulatory.  Objective: Alert and oriented to person place and time. TMs normal. Eyes PERRLA. Throat clear. Neck supple. Chest clear. Heart regular without murmurs. Occasional ectopy. Cranial nerves grossly intact 2-12. Motor symmetrical. Gait is a little shuffled, but that is from her orthopedic problems. Finger to nose normal. Patient is a little obstinate.  EKG: Occasional PVC, otherwise normal  Glucose 113  Assessment: Acute confusional episode, differential includes TIA or early dementia issues History of previous confusional episodes 3 times Obstinate behavior PVCs  Plan: Chart was reviewed Spoke with Dr. Delfina Redwood Discussed with patient and family Send her to the emergency room by EMS. Had to talk with the patient a couple of times to convince her to be willing to go to the emergency room. 45 min care

## 2014-11-09 NOTE — ED Notes (Signed)
Pt monitored by pulse ox, bp cuff, and 12-lead. 

## 2014-11-09 NOTE — H&P (Signed)
[facility] History and Physical  Renee Zuniga RKY:[id number] DOB: Jun 20, 1931 DOA: 11/09/2014  Referring physician: ED physician PCP: Kandice Hams, MD  Specialists:   Chief Complaint: Acute confusion  HPI: Renee Zuniga is a 80 y.o. female with history of migraine headache, hypertension, hypercholesterolemia, arthritis, who presents with acute confusion.  Patient has been in her normal health condition until this PM. At about 2:45, patient became confused after returning from the grocery store. She is holding her car keys and was uncertain as to what these were used for. Per her husband, patient kept saying "I do not know". This lasted for approximately 1.5 hours and resolved spontaneously. Patient did not have weakness, numbness or tingling sensations in her extremities. No vision change or hearing loss. She denies fever, chills, fatigue, headaches, cough, chest pain, SOB, abdominal pain, diarrhea, constipation, dysuria, urgency, frequency, hematuria, skin rashes, joint pain or leg swelling. She was taken to urgent care where she was evaluated and sent here for further workup.   Of note, she had 3 similar episodes over past 3 years.   In ED, MRI/MRA did not show acute abnormalities. Patient is admitted to inpatient for further evaluation and treatment.  Review of Systems: As presented in the history of presenting illness, rest negative.  Where does patient live?  At home Can patient participate in ADLs? Yes  Allergy:  Allergies  Allergen Reactions  . Demerol [Meperidine]     Sick on stomach   . Lunesta [Eszopiclone]     Speech     Past Medical History  Diagnosis Date  . Arthritis   . Heart murmur   . High blood pressure   . Mitral valve prolapse   . Osteoarthritis   . Vitamin D deficiency   . Migraine headache   . Hypercholesteremia   . Tinnitus   . Osteopenia   . Memory loss   . Essential hypertension, benign   . Insomnia     Past Surgical History   Procedure Laterality Date  . Cholecystectomy    . Partial hysterectomy  1974    due to uterine prolaspe  . Gallbladder surgery    . Correction hammer toe Bilateral   . Oophorectomy      benign tumor, per patient    Social History:  reports that she has never smoked. She has never used smokeless tobacco. She reports that she drinks about 0.6 oz of alcohol per week. She reports that she does not use illicit drugs.  Family History:  Family History  Problem Relation Age of Onset  . Mitral valve prolapse Father   . Heart Problems Mother   . Breast cancer Mother   . Hypertension Mother      Prior to Admission medications   Medication Sig Start Date End Date Taking? Authorizing Provider  aspirin 81 MG tablet Take 81 mg by mouth daily.   Yes Historical Provider, MD  calcium carbonate (OS-CAL - DOSED IN MG OF ELEMENTAL CALCIUM) 1250 MG tablet Take 1 tablet by mouth daily.   Yes Historical Provider, MD  chlordiazePOXIDE (LIBRIUM) 10 MG capsule Take 10 mg by mouth at bedtime.   Yes Historical Provider, MD  diclofenac (VOLTAREN) 75 MG EC tablet Take 75 mg by mouth daily.   Yes Historical Provider, MD  diclofenac sodium (VOLTAREN) 1 % GEL Apply 2 g topically 4 (four) times daily.   Yes Historical Provider, MD  ibandronate (BONIVA) 150 MG tablet Take 150 mg by mouth every 30 (thirty)  days. Take in the morning with a full glass of water, on an empty stomach, and do not take anything else by mouth or lie down for the next 30 min.   Yes Historical Provider, MD  lisinopril (PRINIVIL,ZESTRIL) 10 MG tablet Take 10 mg by mouth daily.   Yes Historical Provider, MD  Magnesium 250 MG TABS Take by mouth as directed.   Yes Historical Provider, MD  Multiple Vitamins-Minerals (MULTIVITAMIN WITH MINERALS) tablet Take 1 tablet by mouth daily.   Yes Historical Provider, MD  Multiple Vitamins-Minerals (OCUVITE PO) Take 1 tablet by mouth daily.   Yes Historical Provider, MD  Omega-3 Fatty Acids (FISH OIL) 1000 MG  CAPS Take by mouth as directed.   Yes Historical Provider, MD  pramipexole (MIRAPEX) 0.125 MG tablet Take 0.25 mg by mouth at bedtime.   Yes Historical Provider, MD  vitamin E 400 UNIT capsule Take 400 Units by mouth daily.   Yes Historical Provider, MD    Physical Exam: Filed Vitals:   11/09/14 1756 11/09/14 1830 11/09/14 1930 11/09/14 2000  BP:  150/72 147/52 139/76  Pulse:  86 74 83  Temp: 98.1 F (36.7 C)     TempSrc: Oral     Resp:  20 14 21   SpO2:  99% 99% 100%   General: Not in acute distress HEENT:       Eyes: PERRL, EOMI, no scleral icterus       ENT: No discharge from the ears and nose, no pharynx injection, no tonsillar enlargement.        Neck: No JVD, no bruit, no mass felt. Cardiac: S1/S2, RRR, No murmurs, No gallops or rubs Pulm: Good air movement bilaterally. Clear to auscultation bilaterally. No rales, wheezing, rhonchi or rubs. Abd: Soft, nondistended, nontender, no rebound pain, no organomegaly, BS present Ext: No edema bilaterally. 2+DP/PT pulse bilaterally Musculoskeletal: No joint deformities, erythema, or stiffness, ROM full Skin: No rashes.  Neuro: Alert and oriented X3, cranial nerves II-XII grossly intact, muscle strength 5/5 in all extremeties, sensation to light touch intact. Brachial reflex 2+ bilaterally. Knee reflex 1+ bilaterally. Negative Babinski's sign. Normal finger to nose test. Psych: Patient is not psychotic, no suicidal or hemocidal ideation.  Labs on Admission:  Basic Metabolic Panel:  Recent Labs Lab 11/09/14 1818  NA 138  K 5.2  CL 102  CO2 23  GLUCOSE 99  BUN 28*  CREATININE 0.90  CALCIUM 10.3   Liver Function Tests:  Recent Labs Lab 11/09/14 1818  AST 26  ALT 23  ALKPHOS 55  BILITOT 0.2*  PROT 7.2  ALBUMIN 4.0   No results for input(s): LIPASE, AMYLASE in the last 168 hours. No results for input(s): AMMONIA in the last 168 hours. CBC:  Recent Labs Lab 11/09/14 1818  WBC 5.2  NEUTROABS 2.6  HGB 13.1  HCT  39.3  MCV 94.7  PLT 295   Cardiac Enzymes: No results for input(s): CKTOTAL, CKMB, CKMBINDEX, TROPONINI in the last 168 hours.  BNP (last 3 results) No results for input(s): PROBNP in the last 8760 hours. CBG: No results for input(s): GLUCAP in the last 168 hours.  Radiological Exams on Admission: Mr Brain Wo Contrast (neuro Protocol)  11/09/2014   CLINICAL DATA:  Initial evaluation for acute altered mental status.  EXAM: MRI HEAD WITHOUT CONTRAST  TECHNIQUE: Multiplanar, multiecho pulse sequences of the brain and surrounding structures were obtained without intravenous contrast.  COMPARISON:  Prior MRI from 09/03/2013  FINDINGS: Diffuse prominence of the CSF containing  spaces is compatible with generalized cerebral atrophy. Patchy T2/FLAIR hyperintensity within the periventricular and deep white matter both cerebral hemispheres is present, likely related to chronic small vessel ischemic disease, mild for patient age.  No abnormal foci of restricted diffusion to suggest acute intracranial infarct identified. Gray-white matter differentiation maintained. Normal intravascular flow voids seen within the major intracranial arterial vasculature. Probable small remote infarcts noted within the bilateral cerebellar hemispheres, stable from prior.  No mass lesion, midline shift, or mass effect. Ventricular prominence related to global parenchymal atrophy without hydrocephalus present. No extra-axial fluid collection.  Craniocervical junction within normal limits. Pituitary gland is normal. Mild scattered degenerative changes noted within the visualized upper cervical spine.  No acute abnormality seen about either orbit.  Bone marrow signal intensity within normal limits. Calvarium intact. Scalp soft tissues within normal limits.  Paranasal sinuses are clear. Scant fluid signal intensity present within the left mastoid air cells. Inner ear structures within normal limits.  IMPRESSION: 1. No acute intracranial  infarct or other process identified. 2. Generalized age-related cerebral atrophy with mild chronic small vessel ischemic disease.   Electronically Signed   By: Jeannine Boga M.D.   On: 11/09/2014 21:44    EKG: Independently reviewed.   Assessment/Plan Principal Problem:   Acute confusional state Active Problems:   Mitral valve prolapse   Osteoarthritis   Vitamin D deficiency   Migraine headache   Hypercholesteremia   TIA (transient ischemic attack)   Acute confusion   HTN (hypertension)  Acute confusion: Etiology is not clear. Neurology was consulted by ED. Differential diagnoses include TIA, partial seizures, complicated migraine per Dr. Leonel Ramsay.  -will admit tele bed - follow up neuro recs as follows:  1. HgbA1c, fasting lipid panel 2. MRI, MRA  of the brain without contrast which were done in ED 3. Frequent neuro checks 4. Echocardiogram 5. Carotid dopplers 6. Prophylactic therapy-Antiplatelet med: Aspirin - dose 325mg  PO or 300mg  PR 7. Risk factor modification 8. Telemetry monitoring 9. EEG -will hold lisinopril in the setting of possible TIA/Stroke  HLD: No lipid profile on record. On fish oil at home - continue home meds - check FLP  HTN: on lisinopril at home -Hold lisinopril as above  Migraine headache: No headache on admission -Continue home medications.   DVT ppx: SQ Heparin  Code Status: Full code Family Communication:  Yes, patient's   Husband and daughter    at bed side Disposition Plan: Admit to inpatient   Date of Service 11/09/2014    Ivor Costa [facility] Pager [phone]  If 7PM-7AM, please contact night-coverage [email or web address] Password [email or web address] 11/09/2014, 10:39 PM

## 2014-11-09 NOTE — ED Notes (Signed)
Pt assisted to bathroom in wheelchair by family member. PT returned to bed. Dr. Blaine Hamper at bedside.

## 2014-11-09 NOTE — ED Notes (Signed)
Per EMS, pt was tranffered from [facility] to be evaluated for sudden onset of confusion today at 230. Pt alert and oriented x 4 at this time. NAD at this time. CBG:132. BP: 154/66, HR:96, SpO2: 100% RA

## 2014-11-10 ENCOUNTER — Inpatient Hospital Stay (HOSPITAL_COMMUNITY): Payer: Medicare Other

## 2014-11-10 DIAGNOSIS — R569 Unspecified convulsions: Secondary | ICD-10-CM | POA: Diagnosis not present

## 2014-11-10 DIAGNOSIS — I341 Nonrheumatic mitral (valve) prolapse: Secondary | ICD-10-CM | POA: Diagnosis not present

## 2014-11-10 DIAGNOSIS — E559 Vitamin D deficiency, unspecified: Secondary | ICD-10-CM | POA: Diagnosis not present

## 2014-11-10 DIAGNOSIS — I10 Essential (primary) hypertension: Secondary | ICD-10-CM | POA: Diagnosis not present

## 2014-11-10 DIAGNOSIS — I059 Rheumatic mitral valve disease, unspecified: Secondary | ICD-10-CM | POA: Diagnosis not present

## 2014-11-10 DIAGNOSIS — M1990 Unspecified osteoarthritis, unspecified site: Secondary | ICD-10-CM | POA: Diagnosis not present

## 2014-11-10 DIAGNOSIS — F05 Delirium due to known physiological condition: Secondary | ICD-10-CM | POA: Diagnosis not present

## 2014-11-10 DIAGNOSIS — R4182 Altered mental status, unspecified: Secondary | ICD-10-CM | POA: Diagnosis not present

## 2014-11-10 DIAGNOSIS — R410 Disorientation, unspecified: Secondary | ICD-10-CM | POA: Diagnosis not present

## 2014-11-10 LAB — LIPID PANEL
Cholesterol: 214 mg/dL — ABNORMAL HIGH (ref 0–200)
HDL: 62 mg/dL (ref >39)
LDL Cholesterol: 118 mg/dL — ABNORMAL HIGH (ref 0–99)
Total CHOL/HDL Ratio: 3.5 RATIO
Triglycerides: 172 mg/dL — ABNORMAL HIGH (ref <150)
VLDL: 34 mg/dL (ref 0–40)

## 2014-11-10 LAB — HEMOGLOBIN A1C
Hgb A1c MFr Bld: 6 % — ABNORMAL HIGH (ref <5.7)
Mean Plasma Glucose: 126 mg/dL — ABNORMAL HIGH (ref <117)

## 2014-11-10 MED ORDER — LEVETIRACETAM 500 MG PO TABS: 500.0000 mg | ORAL_TABLET | Freq: Two times a day (BID) | ORAL | Status: DC

## 2014-11-10 NOTE — Discharge Instructions (Addendum)
Levetiracetam tablets What is this medicine? LEVETIRACETAM (lee ve tye RA se tam) is an antiepileptic drug. It is used with other medicines to treat certain types of seizures. This medicine may be used for other purposes; ask your health care provider or pharmacist if you have questions. COMMON BRAND NAME(S): Keppra What should I tell my health care provider before I take this medicine? They need to know if you have any of these conditions: -kidney disease -suicidal thoughts, plans, or attempt; a previous suicide attempt by you or a family member -an unusual or allergic reaction to levetiracetam, other medicines, foods, dyes, or preservatives -pregnant or trying to get pregnant -breast-feeding How should I use this medicine? Take this medicine by mouth with a glass of water. Follow the directions on the prescription label. Swallow the tablets whole. Do not crush or chew this medicine. You may take this medicine with or without food. Take your doses at regular intervals. Do not take your medicine more often than directed. Do not stop taking this medicine or any of your seizure medicines unless instructed by your doctor or health care professional. Stopping your medicine suddenly can increase your seizures or their severity. A special MedGuide will be given to you by the pharmacist with each prescription and refill. Be sure to read this information carefully each time. Contact your pediatrician or health care professional regarding the use of this medication in children. While this drug may be prescribed for children as young as 4 years of age for selected conditions, precautions do apply. Overdosage: If you think you have taken too much of this medicine contact a poison control center or emergency room at once. NOTE: This medicine is only for you. Do not share this medicine with others. What if I miss a dose? If you miss a dose, take it as soon as you can. If it is almost time for your next dose,  take only that dose. Do not take double or extra doses. What may interact with this medicine? This medicine may interact with the following medications: -carbamazepine -colesevelam -probenecid -sevelamer This list may not describe all possible interactions. Give your health care provider a list of all the medicines, herbs, non-prescription drugs, or dietary supplements you use. Also tell them if you smoke, drink alcohol, or use illegal drugs. Some items may interact with your medicine. What should I watch for while using this medicine? Visit your doctor or health care professional for a regular check on your progress. Wear a medical identification bracelet or chain to say you have epilepsy, and carry a card that lists all your medications. It is important to take this medicine exactly as instructed by your health care professional. When first starting treatment, your dose may need to be adjusted. It may take weeks or months before your dose is stable. You should contact your doctor or health care professional if your seizures get worse or if you have any new types of seizures. You may get drowsy or dizzy. Do not drive, use machinery, or do anything that needs mental alertness until you know how this medicine affects you. Do not stand or sit up quickly, especially if you are an older patient. This reduces the risk of dizzy or fainting spells. Alcohol may interfere with the effect of this medicine. Avoid alcoholic drinks. The use of this medicine may increase the chance of suicidal thoughts or actions. Pay special attention to how you are responding while on this medicine. Any worsening of mood, or thoughts   of suicide or dying should be reported to your health care professional right away. Women who become pregnant while using this medicine may enroll in the [place] Antiepileptic Drug Pregnancy Registry by calling 1-888-233-2334. This registry collects information about the safety of antiepileptic  drug use during pregnancy. What side effects may I notice from receiving this medicine? Side effects you should report to your doctor or health care professional as soon as possible: -allergic reactions like skin rash, itching or hives, swelling of the face, lips, or tongue -breathing problems -dark urine -general ill feeling or flu-like symptoms -problems with balance, talking, walking -unusually weak or tired -worsening of mood, thoughts or actions of suicide or dying -yellowing of the eyes or skin Side effects that usually do not require medical attention (report to your doctor or health care professional if they continue or are bothersome): -diarrhea -dizzy, drowsy -headache -loss of appetite This list may not describe all possible side effects. Call your doctor for medical advice about side effects. You may report side effects to FDA at 1-800-FDA-1088. Where should I keep my medicine? Keep out of reach of children. Store at room temperature between 15 and 30 degrees C (59 and 86 degrees F). Throw away any unused medicine after the expiration date. NOTE: This sheet is a summary. It may not cover all possible information. If you have questions about this medicine, talk to your doctor, pharmacist, or health care provider.  2015, Elsevier/Gold Standard. (2013-10-21 08:42:48)  

## 2014-11-10 NOTE — Progress Notes (Signed)
SLP Cancellation Note  Patient Details Name: Renee Zuniga MRN: [id number] DOB: 02/04/1931   Cancelled treatment:       Reason Eval/Treat Not Completed: SLP screened, no needs identified, will sign off  [place], CCC-SLP [phone]  Gabriel Rainwater Meryl 11/10/2014, 12:29 PM

## 2014-11-10 NOTE — Evaluation (Signed)
Occupational Therapy Evaluation Patient Details Name: Renee Zuniga MRN: [id number] DOB: [birth year] Today's Date: 11/10/2014    History of Present Illness Renee Zuniga is a 80 y.o. female with a history of 3 episodes of transient confusion. Her husband describes the events as being like a "lightswitch" goes off and she is no longer herself. The day of admission the event lasted somewhere between 30 minutes and 2 hours.  She subjectively feels like she has intact memory of the event. She apparently had sudden onset of confusion as evidenced by perseveration "I don't know what I need to do, I don't know what I need to do, I don't know what I need to do" as well as difficulty with routine tasks (wrapping a present) and difficulty with word finding, was trying to ask for tape and couldn't get the word out.  work up underway.  MRI of brain was negative for acute event    Clinical Impression   Patient evaluated by Occupational Therapy with no further acute OT needs identified. All education has been completed and the patient has no further questions. Pt appears to be at baseline. No deficits noted and she is able to perform BADLs independently. See below for any follow-up Occupational Therapy or equipment needs. OT is signing off. Thank you for this referral.      Follow Up Recommendations  No OT follow up    Equipment Recommendations  None recommended by OT    Recommendations for Other Services       Precautions / Restrictions Precautions Precautions: Fall Restrictions Weight Bearing Restrictions: No      Mobility Bed Mobility Overal bed mobility: Independent Bed Mobility: Sit to Supine;Supine to Sit     Supine to sit: Supervision;HOB elevated Sit to supine: Supervision;HOB elevated   General bed mobility comments: Pt able to perform bed mobility with physical assistance or cuing from PT. Pt required cuing to scoot over in bed for safety so she was not so close to the edge.    Transfers Overall transfer level: Independent Equipment used: None Transfers: Sit to/from Stand Sit to Stand: Supervision         General transfer comment: Pt able to stand from bed with supervision from PT for safety. Pt slow to reach full standing but safe and stable, without loss of balance.      Balance Overall balance assessment: No apparent balance deficits (not formally assessed) Sitting-balance support: Feet unsupported;No upper extremity supported Sitting balance-Leahy Scale: Good     Standing balance support: During functional activity;No upper extremity supported Standing balance-Leahy Scale: Good Standing balance comment: Pt able to ambulate safely without assistance.                             ADL Overall ADL's : Independent                                             Vision Eye Alignment: Within Functional Limits   Ocular Range of Motion: Within Functional Limits Tracking/Visual Pursuits: Able to track stimulus in all quads without difficulty Saccades: Within functional limits       Additional Comments: Pt able to scan environment independently while walking and reading signage    Perception Perception Perception Tested?: Yes   Praxis Praxis Praxis tested?: Within functional limits  Pertinent Vitals/Pain Pain Assessment: No/denies pain     Hand Dominance     Extremity/Trunk Assessment Upper Extremity Assessment Upper Extremity Assessment: Overall WFL for tasks assessed   Lower Extremity Assessment Lower Extremity Assessment: Defer to PT evaluation   Cervical / Trunk Assessment Cervical / Trunk Assessment: Kyphotic   Communication Communication Communication: No difficulties   Cognition Arousal/Alertness: Awake/alert Behavior During Therapy: WFL for tasks assessed/performed Overall Cognitive Status: Within Functional Limits for tasks assessed                     General Comments        Exercises       Shoulder Instructions      Home Living Family/patient expects to be discharged to:: Private residence Living Arrangements: Spouse/significant other Available Help at Discharge: Family Type of Home: House Home Access: Stairs to enter Technical brewer of Steps: 4 Entrance Stairs-Rails: Right;Left;Can reach both Home Layout: Two level Alternate Level Stairs-Number of Steps: 14 Alternate Level Stairs-Rails: Left Bathroom Shower/Tub: Occupational psychologist: Standard Bathroom Accessibility: Yes How Accessible: Accessible via walker Home Equipment: Grab bars - tub/shower          Prior Functioning/Environment Level of Independence: Independent        Comments: Pt drives.  She does have a h/o sciatica which causes pain in the morning and limits her ability to pick items up off the floor.    OT Diagnosis:     OT Problem List:     OT Treatment/Interventions:      OT Goals(Current goals can be found in the care plan section) Acute Rehab OT Goals OT Goal Formulation: All assessment and education complete, DC therapy  OT Frequency:     Barriers to D/C:            Co-evaluation              End of Session    Activity Tolerance: Patient tolerated treatment well Patient left: in bed;with call bell/phone within reach;with family/visitor present   Time: 4163-8453 OT Time Calculation (min): 14 min Charges:  OT General Charges $OT Visit: 1 Procedure OT Evaluation $Initial OT Evaluation Tier I: 1 Procedure G-Codes:    Lucille Passy M 2014-11-14, 2:48 PM

## 2014-11-10 NOTE — Care Management Note (Unsigned)
    Page 1 of 1   11/10/2014     2:12:25 PM CARE MANAGEMENT NOTE 11/10/2014  Patient:  Renee Zuniga, Renee Zuniga   Account Number:  [id number]  Date Initiated:  11/10/2014  Documentation initiated by:  GRAVES-BIGELOW,Klein Willcox  Subjective/Objective Assessment:   Pt admitted for AMS- Pt is form home with husband.     Action/Plan:   CM will continue to monitor for disposition needs once medically stable for d/c.   Anticipated DC Date:  11/11/2014   Anticipated DC Plan:  [place]  CM consult      Choice offered to / List presented to:             Status of service:  In process, will continue to follow Medicare Important Message given?   (If response is "NO", the following Medicare IM given date fields will be blank) Date Medicare IM given:   Medicare IM given by:   Date Additional Medicare IM given:   Additional Medicare IM given by:    Discharge Disposition:    Per UR Regulation:  Reviewed for med. necessity/level of care/duration of stay  If discussed at [place] of Stay Meetings, dates discussed:    Comments:

## 2014-11-10 NOTE — Procedures (Signed)
History: 80 year old female with a history of recurrent episodes of altered mental status  Sedation: None  Technique: This is a 17 channel routine scalp EEG performed at the bedside with bipolar and monopolar montages arranged in accordance to the international 10/20 system of electrode placement. One channel was dedicated to EKG recording.   Background: There is a well defined posterior dominant rhythm of 9 Hz that attenuates with eye opening. The background consists predominantly of intermixed alpha and beta activities. There is a wicket rhythm seen bilaterally with sharp appearing waves in both temporal regions. In addition, I do feel like there are sharp waves at F7, T7 > P7 which disrupt the background, though the presence of wicket spikes does confound this to some degree as they have the same distribution  Photic stimulation: Physiologic driving is present  EEG Abnormalities: 1) Sharp waves in the left temporal region.   Clinical Interpretation: This EEG is likely suggestive of a potential seizure focus in the left temporal region. The presence of a wicket pattern is a potential confounder as this can cause an epileptiform appearance in non-epileptic patients but given that there were sharp waves which appeared to disrupt the background, I would favor an epileptiform nature There was no seizure recorded on this study.   Roland Rack, MD [facility] [phone]  If 7pm- 7am, please page neurology on call as listed in [place].

## 2014-11-10 NOTE — Plan of Care (Signed)
Problem: Consults Goal: Ischemic Stroke Patient Education See Patient Education Module for education specifics.  Outcome: Completed/Met Date Met:  11/10/14 Goal: Skin Care Protocol Initiated - if Braden Score 18 or less If consults are not indicated, leave blank or document N/A  Outcome: Completed/Met Date Met:  11/10/14 Goal: Nutrition Consult-if indicated Outcome: Not Applicable Date Met:  88/64/84 Goal: Diabetes Guidelines if Diabetic/Glucose > 140 If diabetic or lab glucose is > 140 mg/dl - Initiate Diabetes/Hyperglycemia Guidelines & Document Interventions  Outcome: Not Applicable Date Met:  72/07/21  Problem: Acute Treatment Outcomes Goal: Neuro exam at baseline or improved Outcome: Completed/Met Date Met:  11/10/14 Goal: BP within ordered parameters Outcome: Completed/Met Date Met:  11/10/14

## 2014-11-10 NOTE — Progress Notes (Signed)
[facility] POA forms placed in shadow chart. Etta Quill, RN

## 2014-11-10 NOTE — Progress Notes (Signed)
PT Cancellation Note  Patient Details Name: Renee Zuniga MRN: [id number] DOB: [birth year]   Cancelled Treatment:    Reason Eval/Treat Not Completed: Patient at procedure or test/unavailable (In tests all am, Doppler, ECHO, EEG)   Irwin Brakeman F 11/10/2014, 10:28 AM  [facility] [phone] (pager)

## 2014-11-10 NOTE — Discharge Summary (Signed)
PATIENT DETAILS Name: Renee Zuniga Age: 80 y.o. Sex: female Date of Birth: Aug 13, 1931 MRN: [id number]. Admitting Physician: Ivor Costa, MD WUJ:WJXBJY,NWGNFA D, MD  Admit Date: 11/09/2014 Discharge date: 11/10/2014  Recommendations for Outpatient Follow-up:  1. New medication: Started on Keppra for possible seizures-abnormal EEG.  PRIMARY DISCHARGE DIAGNOSIS:  Principal Problem:   Acute confusional state Active Problems:   Mitral valve prolapse   Osteoarthritis   Vitamin D deficiency   Migraine headache   Hypercholesteremia   TIA (transient ischemic attack)   Acute confusion   HTN (hypertension)      PAST MEDICAL HISTORY: Past Medical History  Diagnosis Date  . Arthritis   . Heart murmur   . High blood pressure   . Mitral valve prolapse   . Osteoarthritis   . Vitamin D deficiency   . Migraine headache   . Hypercholesteremia   . Tinnitus   . Osteopenia   . Memory loss   . Essential hypertension, benign   . Insomnia     DISCHARGE MEDICATIONS: Current Discharge Medication List    START taking these medications   Details  levETIRAcetam (KEPPRA) 500 MG tablet Take 1 tablet (500 mg total) by mouth 2 (two) times daily. Qty: 120 tablet, Refills: 0      CONTINUE these medications which have NOT CHANGED   Details  aspirin 81 MG tablet Take 81 mg by mouth daily.    calcium carbonate (OS-CAL - DOSED IN MG OF ELEMENTAL CALCIUM) 1250 MG tablet Take 1 tablet by mouth daily.    chlordiazePOXIDE (LIBRIUM) 10 MG capsule Take 10 mg by mouth at bedtime.    diclofenac (VOLTAREN) 75 MG EC tablet Take 75 mg by mouth daily.    diclofenac sodium (VOLTAREN) 1 % GEL Apply 2 g topically 4 (four) times daily.    ibandronate (BONIVA) 150 MG tablet Take 150 mg by mouth every 30 (thirty) days. Take in the morning with a full glass of water, on an empty stomach, and do not take anything else by mouth or lie down for the next 30 min.    lisinopril (PRINIVIL,ZESTRIL) 10 MG  tablet Take 10 mg by mouth daily.    Magnesium 250 MG TABS Take by mouth as directed.    !! Multiple Vitamins-Minerals (MULTIVITAMIN WITH MINERALS) tablet Take 1 tablet by mouth daily.    !! Multiple Vitamins-Minerals (OCUVITE PO) Take 1 tablet by mouth daily.    Omega-3 Fatty Acids (FISH OIL) 1000 MG CAPS Take by mouth as directed.    pramipexole (MIRAPEX) 0.125 MG tablet Take 0.25 mg by mouth at bedtime.    vitamin E 400 UNIT capsule Take 400 Units by mouth daily.     !! - Potential duplicate medications found. Please discuss with provider.      ALLERGIES:   Allergies  Allergen Reactions  . Demerol [Meperidine]     Sick on stomach   . Lunesta [Eszopiclone]     Speech     BRIEF HPI:  See H&P, Labs, Consult and Test reports for all details in brief, patient is a 80 year old female who was brought in for a period of acute confusion.  CONSULTATIONS:   neurology  PERTINENT RADIOLOGIC STUDIES: Mr Brain Wo Contrast (neuro Protocol)  11/09/2014   CLINICAL DATA:  Initial evaluation for acute altered mental status.  EXAM: MRI HEAD WITHOUT CONTRAST  TECHNIQUE: Multiplanar, multiecho pulse sequences of the brain and surrounding structures were obtained without intravenous contrast.  COMPARISON:  Prior MRI from  09/03/2013  FINDINGS: Diffuse prominence of the CSF containing spaces is compatible with generalized cerebral atrophy. Patchy T2/FLAIR hyperintensity within the periventricular and deep white matter both cerebral hemispheres is present, likely related to chronic small vessel ischemic disease, mild for patient age.  No abnormal foci of restricted diffusion to suggest acute intracranial infarct identified. Gray-white matter differentiation maintained. Normal intravascular flow voids seen within the major intracranial arterial vasculature. Probable small remote infarcts noted within the bilateral cerebellar hemispheres, stable from prior.  No mass lesion, midline shift, or mass effect.  Ventricular prominence related to global parenchymal atrophy without hydrocephalus present. No extra-axial fluid collection.  Craniocervical junction within normal limits. Pituitary gland is normal. Mild scattered degenerative changes noted within the visualized upper cervical spine.  No acute abnormality seen about either orbit.  Bone marrow signal intensity within normal limits. Calvarium intact. Scalp soft tissues within normal limits.  Paranasal sinuses are clear. Scant fluid signal intensity present within the left mastoid air cells. Inner ear structures within normal limits.  IMPRESSION: 1. No acute intracranial infarct or other process identified. 2. Generalized age-related cerebral atrophy with mild chronic small vessel ischemic disease.   Electronically Signed   By: Jeannine Boga M.D.   On: 11/09/2014 21:44     PERTINENT LAB RESULTS: CBC:  Recent Labs  11/09/14 1818  WBC 5.2  HGB 13.1  HCT 39.3  PLT 295   CMET CMP     Component Value Date/Time   NA 138 11/09/2014 1818   K 5.2 11/09/2014 1818   CL 102 11/09/2014 1818   CO2 23 11/09/2014 1818   GLUCOSE 99 11/09/2014 1818   BUN 28* 11/09/2014 1818   CREATININE 0.90 11/09/2014 1818   CALCIUM 10.3 11/09/2014 1818   PROT 7.2 11/09/2014 1818   ALBUMIN 4.0 11/09/2014 1818   AST 26 11/09/2014 1818   ALT 23 11/09/2014 1818   ALKPHOS 55 11/09/2014 1818   BILITOT 0.2* 11/09/2014 1818   GFRNONAA 58* 11/09/2014 1818   GFRAA 68* 11/09/2014 1818    GFR Estimated Creatinine Clearance: 45.9 mL/min (by C-G formula based on Cr of 0.9). No results for input(s): LIPASE, AMYLASE in the last 72 hours. No results for input(s): CKTOTAL, CKMB, CKMBINDEX, TROPONINI in the last 72 hours. Invalid input(s): POCBNP No results for input(s): DDIMER in the last 72 hours.  Recent Labs  11/10/14 0335  HGBA1C 6.0*    Recent Labs  11/10/14 0335  CHOL 214*  HDL 62  LDLCALC 118*  TRIG 172*  CHOLHDL 3.5   No results for input(s):  TSH, T4TOTAL, T3FREE, THYROIDAB in the last 72 hours.  Invalid input(s): FREET3 No results for input(s): VITAMINB12, FOLATE, FERRITIN, TIBC, IRON, RETICCTPCT in the last 72 hours. Coags:  Recent Labs  11/09/14 1818  INR 1.04   Microbiology: No results found for this or any previous visit (from the past 240 hour(s)).   BRIEF HOSPITAL COURSE:   Principal Problem:   Acute confusional state: Patient was brought in for period of acute confusional state, per family she has had at least 2 prior episodes. She may have had 2 more episodes-but patient and family think that could have been from Ambien. In any event, patient was admitted, underwent MRI brain which was negative for acute CVA, carotid Doppler was negative for significant stenosis, 2-D echocardiogram was negative for any embolic source. Patient then underwent an EEG, which was abnormal. Suspect seizure as a potential culprit for her presenting symptoms. She was then seen by neurology  on follow-up on 11/10/14, recommendations are to start Keppra 500 mg twice a day. I've made her a follow-up appointment with neurology as an outpatient. Current recommendations are not to drive, engage in activities at heights, engage in high-speed sports or operate heavy machinery until cleared by primary neurologist or primary care physician. Patient was also instructed, that if she experienced mild drowsiness from Keppra, to decrease dose to 250 mg twice a day. Long discussion was held with multiple family members, above tests, above plan was explained to numerous family members and patient in great detail.  Rest of her medical issues were stable during this admission.   TODAY-DAY OF DISCHARGE:  Subjective:   [name] today has no headache,no chest abdominal pain,no new weakness tingling or numbness, feels much better wants to go home today.   Objective:   Blood pressure 135/62, pulse 72, temperature 97.7 F (36.5 C), temperature source Oral,  resp. rate 16, height 5\' 6"  (1.676 m), weight 68.493 kg (151 lb), SpO2 100 %.  Intake/Output Summary (Last 24 hours) at 11/10/14 1551 Last data filed at 11/10/14 0835  Gross per 24 hour  Intake    240 ml  Output      0 ml  Net    240 ml   Filed Weights   11/10/14 0000  Weight: 68.493 kg (151 lb)    Exam Awake Alert, Oriented *3, No new F.N deficits, Normal affect [place].AT,PERRAL Supple Neck,No JVD, No cervical lymphadenopathy appriciated.  Symmetrical Chest wall movement, Good air movement bilaterally, CTAB RRR,No Gallops,Rubs or new Murmurs, No Parasternal Heave +ve B.Sounds, Abd Soft, Non tender, No organomegaly appriciated, No rebound -guarding or rigidity. No Cyanosis, Clubbing or edema, No new Rash or bruise  DISCHARGE CONDITION: Stable  DISPOSITION: Home  DISCHARGE INSTRUCTIONS:    Activity:  As tolerated with Full fall precautions use walker/cane & assistance as needed  Diet recommendation: Heart Healthy diet  Discharge Instructions    Call MD for:    Complete by:  As directed   Seizure or recurrent spells of confusion/forgetfullness     Diet - low sodium heart healthy    Complete by:  As directed      Driving Restrictions    Complete by:  As directed   No driving, no operating heavy machinery, do not engage in activities at heights or engage in high-speed sports till seen by your primary care practitioner or primary neurologist and cleared to engage in such  activities.     Increase activity slowly    Complete by:  As directed            Follow-up Information    Follow up with POLITE,RONALD D, MD. Schedule an appointment as soon as possible for a visit in 1 week.   Specialty:  Internal Medicine   Contact information:   [address] [place] [address] [phone]       Follow up with Cameron Sprang, MD On 12/25/2014.   Specialty:  Neurology   Why:  appt at 9 am   Contact information:   [address] [place]  [address] [phone]         Total Time spent on discharge equals 45 minutes.  SignedOren Binet 11/10/2014 3:51 PM

## 2014-11-10 NOTE — Progress Notes (Signed)
*  PRELIMINARY RESULTS* Vascular Ultrasound Carotid Duplex (Doppler) has been completed.  Preliminary findings: Bilateral:  1-39% ICA stenosis.  Vertebral artery flow is antegrade.     Landry Mellow, RDMS, RVT  11/10/2014, 8:56 AM

## 2014-11-10 NOTE — Progress Notes (Signed)
  Echocardiogram 2D Echocardiogram has been performed.  Renee Zuniga 11/10/2014, 9:43 AM

## 2014-11-10 NOTE — Plan of Care (Signed)
Problem: Progression Outcomes Goal: Pain controlled Outcome: Completed/Met Date Met:  11/10/14

## 2014-11-10 NOTE — Evaluation (Signed)
Physical Therapy Evaluation and Discharge Patient Details Name: Renee Zuniga MRN: [id number]  DOB: 01/18/1931 Today's Date: 11/10/2014   History of Present Illness  Renee Zuniga is a 80 y.o. female with a history of 3 episodes of transient confusion. Her husband describes the events as being like a "lightswitch" goes off and she is no longer herself. The day of admission the event lasted somewhere between 30 minutes and 2 hours.  She subjectively feels like she has intact memory of the event. She apparently had sudden onset of confusion as evidenced by perseveration "I don't know what I need to do, I don't know what I need to do, I don't know what I need to do" as well as difficulty with routine tasks (wrapping a present) and difficulty with word finding, was trying to ask for tape and couldn't get the word out.  work up underway.  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 She came home and was going to wrap presents. She could not remember words such as scotch tape. Apparently there and a number of things she just knew but couldn't say. Her husband was concerned and called her daughters. Patient declined going to the emergency room. Her primary care doctor's office, Dr. Delfina Redwood, recommended she go to the emergency room or come here if not willing. She has previously had 3 other episodes like this is couple of years.     Clinical Impression  Pt is reported to be at her baseline mobility status. Pt is safe with bed mobility, transfers, ambulation and negotiating stairs. Pt moves slowly, but appears stable and safe and did not have any loss of balance during PT session. Pt lives in a two story home and refuses to move bedroom to first floor, however, pt able to safely negotiate a full flight of stairs. PT discussed the importance of taking time and not rushing when pt is moving about and both pt and husband stated  that pt always takes her time to remain safe. PT discussed the increased likelihood of a confusion episode recurrence and the importance of getting to the hospital as soon as she notices anything being a little out of the ordinary. PT suggested getting a LifeAlert for pt and pt was agreeable to this. Pt does not require any further acute PT needs and does not require PT at discharge.      Follow Up Recommendations No PT follow up    Equipment Recommendations  None recommended by PT    Recommendations for Other Services       Precautions / Restrictions Precautions Precautions: Fall Restrictions Weight Bearing Restrictions: No      Mobility  Bed Mobility Overal bed mobility: Independent Bed Mobility: Sit to Supine;Supine to Sit     Supine to sit: Supervision;HOB elevated Sit to supine: Supervision;HOB elevated   General bed mobility comments: Pt able to perform bed mobility with physical assistance or cuing from PT. Pt required cuing to scoot over in bed for safety so she was not so close to the edge.   Transfers Overall transfer level: Independent Equipment used: None Transfers: Sit to/from Stand Sit to Stand: Supervision         General transfer comment: Pt able to stand from bed with supervision from PT for safety. Pt slow to reach full standing but safe and stable, without loss of balance.    Ambulation/Gait  Ambulation/Gait assistance: Supervision Ambulation Distance (Feet): 250 Feet Assistive device: None Gait Pattern/deviations: Decreased stride length;Step-through pattern   Gait velocity interpretation: Below normal speed for age/gender General Gait Details: Due to previous history of toe surgeries, pt unable to achieve full terminal stance with toe off movement. Pt's gait appears stiff and she takes small, slow steps. Pt is safe and stable with ambulation and stated that she knows to go slow to be safe. Pt able move head during ambulation without loss of balance.  Pt is reported to be at baseline mobility status.   Stairs Stairs: Yes Stairs assistance: Min guard Stair Management: One rail Left;Alternating pattern;Step to pattern;Forwards Number of Stairs: 10 General stair comments: Pt able to perform stair mobility at baseline status. Pt knows strategies that feel safest to her and is able to effectively utilize them to get around. Pt used Left railing and step-through pattern when ascending and went down sideways, step-to holding Left rail when descending. Pt is slow and careful when negotiating stairs.   Wheelchair Mobility    Modified Rankin (Stroke Patients Only)       Balance Overall balance assessment: No apparent balance deficits (not formally assessed) Sitting-balance support: Feet unsupported;No upper extremity supported Sitting balance-Leahy Scale: Good     Standing balance support: During functional activity;No upper extremity supported Standing balance-Leahy Scale: Good Standing balance comment: Pt able to ambulate safely without assistance.                              Pertinent Vitals/Pain Pain Assessment: No/denies pain    Home Living Family/patient expects to be discharged to:: Private residence Living Arrangements: Spouse/significant other Available Help at Discharge: Family Type of Home: House Home Access: Stairs to enter Entrance Stairs-Rails: Right;Left;Can reach both Technical brewer of Steps: 4 Home Layout: Two level Home Equipment: Grab bars - tub/shower      Prior Function Level of Independence: Independent         Comments: Pt drives.  She does have a h/o sciatica which causes pain in the morning and limits her ability to pick items up off the floor.     Hand Dominance        Extremity/Trunk Assessment   Upper Extremity Assessment: Overall WFL for tasks assessed           Lower Extremity Assessment: Defer to PT evaluation      Cervical / Trunk Assessment: Kyphotic   Communication   Communication: No difficulties  Cognition Arousal/Alertness: Awake/alert Behavior During Therapy: WFL for tasks assessed/performed Overall Cognitive Status: Within Functional Limits for tasks assessed                      General Comments      Exercises        Assessment/Plan    PT Assessment Patent does not need any further PT services  PT Diagnosis Difficulty walking   PT Problem List    PT Treatment Interventions     PT Goals (Current goals can be found in the Care Plan section)      Frequency     Barriers to discharge        Co-evaluation               End of Session Equipment Utilized During Treatment: Gait belt Activity Tolerance: Patient tolerated treatment well Patient left: in bed;with call bell/phone within reach;with family/visitor present  Functional Assessment Tool Used: clinical judgement Functional Limitation: Mobility: Walking and moving around Mobility: Walking and Moving Around Current Status [phone]): At least 1 percent but less than 20 percent impaired, limited or restricted Mobility: Walking and Moving Around Goal Status [phone]): At least 1 percent but less than 20 percent impaired, limited or restricted Mobility: Walking and Moving Around Discharge Status [phone]): At least 1 percent but less than 20 percent impaired, limited or restricted    Time: 1791-5056 PT Time Calculation (min) (ACUTE ONLY): 20 min   Charges:   PT Evaluation $Initial PT Evaluation Tier I: 1 Procedure PT Treatments $Gait Training: 8-22 mins   PT G Codes:   Functional Assessment Tool Used: clinical judgement Functional Limitation: Mobility: Walking and moving around    Lovelace Cerveny, [place] SPT 11/10/2014, 2:53 PM  Jearld Shines, SPT  Acute Rehabilitation [phone]

## 2014-11-10 NOTE — Progress Notes (Signed)
Subjective: Resting comfortably. No further events.   EEG: This EEG is likely suggestive of a potential seizure focus in the left temporal region. The presence of a wicket pattern is a potential confounder as this can cause an epileptiform appearance in non-epileptic patients but given that there were sharp waves which appeared to disrupt the background, I would favor an epileptiform nature There was no seizure recorded on this study.  Objective: Current vital signs: BP 135/62 mmHg  Pulse 72  Temp(Src) 97.7 F (36.5 C) (Oral)  Resp 16  Ht 5\' 6"  (1.676 m)  Wt 68.493 kg (151 lb)  BMI 24.38 kg/m2  SpO2 100% Vital signs in last 24 hours: Temp:  [97.5 F (36.4 C)-98.5 F (36.9 C)] 97.7 F (36.5 C) (12/01 1214) Pulse Rate:  [72-98] 72 (12/01 1214) Resp:  [14-22] 16 (12/01 1214) BP: (113-150)/(51-92) 135/62 mmHg (12/01 1214) SpO2:  [96 %-100 %] 100 % (12/01 1214) Weight:  [68.493 kg (151 lb)] 68.493 kg (151 lb) (12/01 0000)  Intake/Output from previous day:   Intake/Output this shift: Total I/O In: 240 [P.O.:240] Out: -  Nutritional status: Diet Heart  Neurologic Exam: Mental Status: Patient is awake, alert, oriented to person, place, month, year, and situation. Immediate and remote memory are intact. Patient is able to give a clear and coherent history. No signs of aphasia or neglect Cranial Nerves: II: Visual Fields are full. Pupils are equal, round, and reactive to light. Discs are difficult to visualize. III,IV, VI: EOMI without ptosis or diploplia.  V: Facial sensation is symmetric to temperature VII: Facial movement is symmetric.  VIII: hearing is intact to voice X: Uvula elevates symmetrically XI: Shoulder shrug is symmetric. XII: tongue is midline without atrophy or fasciculations.  Motor: Tone is normal. Bulk is normal. 5/5 strength was present in all four extremities.  Sensory: Sensation is symmetric to light touch and temperature in the arms and  legs. Deep Tendon Reflexes: 2+ and symmetric in the biceps and patellae.  Cerebellar: FNF with mild intentional tremor bilateral  Lab Results: Basic Metabolic Panel:  Recent Labs Lab 11/09/14 1818  NA 138  K 5.2  CL 102  CO2 23  GLUCOSE 99  BUN 28*  CREATININE 0.90  CALCIUM 10.3    Liver Function Tests:  Recent Labs Lab 11/09/14 1818  AST 26  ALT 23  ALKPHOS 55  BILITOT 0.2*  PROT 7.2  ALBUMIN 4.0   No results for input(s): LIPASE, AMYLASE in the last 168 hours. No results for input(s): AMMONIA in the last 168 hours.  CBC:  Recent Labs Lab 11/09/14 1818  WBC 5.2  NEUTROABS 2.6  HGB 13.1  HCT 39.3  MCV 94.7  PLT 295    Cardiac Enzymes: No results for input(s): CKTOTAL, CKMB, CKMBINDEX, TROPONINI in the last 168 hours.  Lipid Panel:  Recent Labs Lab 11/10/14 0335  CHOL 214*  TRIG 172*  HDL 62  CHOLHDL 3.5  VLDL 34  LDLCALC 118*    CBG: No results for input(s): GLUCAP in the last 168 hours.  Microbiology: No results found for this or any previous visit.  Coagulation Studies:  Recent Labs  11/09/14 1818  LABPROT 13.7  INR 1.04    Imaging: Mr Brain Wo Contrast (neuro Protocol)  11/09/2014   CLINICAL DATA:  Initial evaluation for acute altered mental status.  EXAM: MRI HEAD WITHOUT CONTRAST  TECHNIQUE: Multiplanar, multiecho pulse sequences of the brain and surrounding structures were obtained without intravenous contrast.  COMPARISON:  Prior MRI  from 09/03/2013  FINDINGS: Diffuse prominence of the CSF containing spaces is compatible with generalized cerebral atrophy. Patchy T2/FLAIR hyperintensity within the periventricular and deep white matter both cerebral hemispheres is present, likely related to chronic small vessel ischemic disease, mild for patient age.  No abnormal foci of restricted diffusion to suggest acute intracranial infarct identified. Gray-white matter differentiation maintained. Normal intravascular flow voids seen  within the major intracranial arterial vasculature. Probable small remote infarcts noted within the bilateral cerebellar hemispheres, stable from prior.  No mass lesion, midline shift, or mass effect. Ventricular prominence related to global parenchymal atrophy without hydrocephalus present. No extra-axial fluid collection.  Craniocervical junction within normal limits. Pituitary gland is normal. Mild scattered degenerative changes noted within the visualized upper cervical spine.  No acute abnormality seen about either orbit.  Bone marrow signal intensity within normal limits. Calvarium intact. Scalp soft tissues within normal limits.  Paranasal sinuses are clear. Scant fluid signal intensity present within the left mastoid air cells. Inner ear structures within normal limits.  IMPRESSION: 1. No acute intracranial infarct or other process identified. 2. Generalized age-related cerebral atrophy with mild chronic small vessel ischemic disease.   Electronically Signed   By: Jeannine Boga M.D.   On: 11/09/2014 21:44    Medications:  Scheduled: . aspirin  300 mg Rectal Daily   Or  . aspirin  325 mg Oral Daily  . beta carotene w/minerals  1 tablet Oral Daily  . calcium carbonate  1 tablet Oral Q breakfast  . chlordiazePOXIDE  10 mg Oral QHS  . diclofenac sodium  2 g Topical TID AC & HS  . heparin  5,000 Units Subcutaneous 3 times per day  . magnesium oxide  400 mg Oral Daily  . multivitamin with minerals  1 tablet Oral Daily  . omega-3 acid ethyl esters  1 g Oral Daily  . pramipexole  0.25 mg Oral QHS  . vitamin E  400 Units Oral Daily    Assessment/Plan: 80 year old female with a history of 3 episodes of transient confusion. Differential includes TIA, partial seizures, complicated migraine. The sudden abrupt change makes me think that is less likely to be early dementia, though this could be certainly a source of seizures. Due to recurrent nature and abnormal EEG will start patient on keppra  500mg  BID.   Discussed plan with patient and family. She will follow up with Dr Ellouise Newer in January.      LOS: 1 day   Jim Like, DO [facility] [phone]  If 7pm- 7am, please page neurology on call as listed in [place]. 11/10/2014  3:10 PM

## 2014-11-10 NOTE — Progress Notes (Signed)
UR Completed Renee Jolicoeur Graves-Bigelow, RN,BSN [phone]  

## 2014-11-10 NOTE — Progress Notes (Signed)
EEG completed, results pending. 

## 2014-11-11 NOTE — Progress Notes (Signed)
Occupational Therapy Evaluation Addendum:    11/22/14 1440  OT G-codes **NOT FOR INPATIENT CLASS**  Functional Limitation Self care  Self Care Current Status [phone]) [facility]  Self Care Goal Status ([id number]) [facility]  Self Care Discharge Status [phone]) [place], OTR/L [phone]

## 2014-11-17 DIAGNOSIS — M1611 Unilateral primary osteoarthritis, right hip: Secondary | ICD-10-CM | POA: Diagnosis not present

## 2014-11-23 DIAGNOSIS — M4806 Spinal stenosis, lumbar region: Secondary | ICD-10-CM | POA: Diagnosis not present

## 2014-11-23 DIAGNOSIS — M47816 Spondylosis without myelopathy or radiculopathy, lumbar region: Secondary | ICD-10-CM | POA: Diagnosis not present

## 2014-11-23 DIAGNOSIS — M5416 Radiculopathy, lumbar region: Secondary | ICD-10-CM | POA: Diagnosis not present

## 2014-11-24 DIAGNOSIS — R9401 Abnormal electroencephalogram [EEG]: Secondary | ICD-10-CM | POA: Diagnosis not present

## 2014-11-24 DIAGNOSIS — N8110 Cystocele, unspecified: Secondary | ICD-10-CM | POA: Diagnosis not present

## 2014-11-24 DIAGNOSIS — N393 Stress incontinence (female) (male): Secondary | ICD-10-CM | POA: Diagnosis not present

## 2014-11-25 DIAGNOSIS — M4806 Spinal stenosis, lumbar region: Secondary | ICD-10-CM | POA: Diagnosis not present

## 2014-11-25 DIAGNOSIS — M47816 Spondylosis without myelopathy or radiculopathy, lumbar region: Secondary | ICD-10-CM | POA: Diagnosis not present

## 2014-12-22 DIAGNOSIS — M170 Bilateral primary osteoarthritis of knee: Secondary | ICD-10-CM | POA: Diagnosis not present

## 2014-12-25 ENCOUNTER — Ambulatory Visit (INDEPENDENT_AMBULATORY_CARE_PROVIDER_SITE_OTHER): Payer: Medicare Other | Admitting: Neurology

## 2014-12-25 ENCOUNTER — Encounter: Payer: Self-pay | Admitting: Neurology

## 2014-12-25 VITALS — BP 136/80 | HR 78 | Resp 16 | Ht 62.5 in | Wt 156.0 lb

## 2014-12-25 DIAGNOSIS — R404 Transient alteration of awareness: Secondary | ICD-10-CM | POA: Diagnosis not present

## 2014-12-25 DIAGNOSIS — G40009 Localization-related (focal) (partial) idiopathic epilepsy and epileptic syndromes with seizures of localized onset, not intractable, without status epilepticus: Secondary | ICD-10-CM

## 2014-12-25 MED ORDER — LEVETIRACETAM 500 MG PO TABS: 500.0000 mg | ORAL_TABLET | Freq: Two times a day (BID) | ORAL | Status: DC

## 2014-12-25 NOTE — Progress Notes (Signed)
NEUROLOGY CONSULTATION NOTE  Renee Zuniga MRN: [id number] DOB: 1931-05-09  Referring provider: Dr. Seward Zuniga Primary care provider: Dr. Seward Zuniga  Reason for consult:  Possible seizures  Dear Dr Renee Zuniga:  Thank you for your kind referral of Renee Zuniga for consultation of the above symptoms. Although her history is well known to you, please allow me to reiterate it for the purpose of our medical record. I initially spoke to the patient, then spoke separately to her husband and daughter who helped supplement the history. Records and images were personally reviewed where available.  HISTORY OF PRESENT ILLNESS: This is an 81 year old right-handed woman with a history of hypertension, hyperlipidemia, hypothyroidism, migraines, presenting with recurrent episodes of transient confusion. She reports the first episode occurred in July 2013 while having dinner with family. Per family, she stopped talking and had difficulty verbalizing. She was brought to the hospital in [place] where workup was unremarkable. She herself did not think she was having a difficult time speaking.  She did not complain of a headache, the episode lasted 10 minutes or so. The second episode occurred in August 2014. At that time, she reports that she woke up feeling confused, went to see her PCP and kept writing the number 3 repeatedly on her paperwork. She was found to have doubled her night dose of Lunesta the night prior, and was back to baseline that afternoon. The most recent episode occurred on 11/09/14, she recalls parking the car, came to the house and sat down looking at her keys. Her husband reports that she kept repeating that she did not know what to do with her keys, that she did not mean to do it, and that she couldn't find her keys. This lasted 6-7 minutes, then she didn't want to go to the hospital. [name] states that she did not think there was anything wrong. She denied feeling dizzy, no  headaches. She was brought [facility], where she was also reported to have word-finding difficulties and unable to do routine tasks such as wrapping a gift. Her CBC, CMP were normal. I personally reviewed MRI brain without contrast which did not show any acute changes, there was generalized cerebral atrophy and mild chronic microvascular change. Hippocampi symmetric with no abnormal signal. She had an EEG which reported wickets in both temporal regions, as well as sharp waves over the left temporal region. She was started on Keppra 500mg  BID which she is tolerating without side effects.   She denies any dizziness, diplopia, dysarthria, dysphagia, neck/back pain, focal numbness/tingling/weakness, bowel/bladder dysfunction. Her family has noticed some short-term memory changes, she made a wrong turn driving one time. She feels her memory is fine. She has had migraines since her 63s, where she would get confused, no associated nausea, vomiting, photo/phonophobia. She has not had a migraine in many years. She denies any olfactory/gustatory hallucinations, rising epigastric sensation, myoclonic jerks. Her mother has migraines. She had been taking Librium for leg cramps, which have resolved.   Epilepsy Risk Factors:  She had a normal birth and early development.  There is no history of febrile convulsions, CNS infections such as meningitis/encephalitis, significant traumatic brain injury, neurosurgical procedures, or family history of seizures.  PAST MEDICAL HISTORY: Past Medical History  Diagnosis Date  . Arthritis   . Heart murmur   . High blood pressure   . Mitral valve prolapse   . Osteoarthritis   . Vitamin D deficiency   . Migraine headache   .  Hypercholesteremia   . Tinnitus   . Osteopenia   . Memory loss   . Essential hypertension, benign   . Insomnia     PAST SURGICAL HISTORY: Past Surgical History  Procedure Laterality Date  . Cholecystectomy    . Partial hysterectomy  1974    due to  uterine prolaspe  . Gallbladder surgery    . Correction hammer toe Bilateral   . Oophorectomy      benign tumor, per patient    MEDICATIONS: Current Outpatient Prescriptions on File Prior to Visit  Medication Sig Dispense Refill  . aspirin 81 MG tablet Take 81 mg by mouth daily.    . calcium carbonate (OS-CAL - DOSED IN MG OF ELEMENTAL CALCIUM) 1250 MG tablet Take 1 tablet by mouth daily.    . chlordiazePOXIDE (LIBRIUM) 10 MG capsule Take 10 mg by mouth at bedtime.    . diclofenac sodium (VOLTAREN) 1 % GEL Apply 2 g topically 4 (four) times daily.    Marland Kitchen ibandronate (BONIVA) 150 MG tablet Take 150 mg by mouth every 30 (thirty) days. Take in the morning with a full glass of water, on an empty stomach, and do not take anything else by mouth or lie down for the next 30 min.    . levETIRAcetam (KEPPRA) 500 MG tablet Take 1 tablet (500 mg total) by mouth 2 (two) times daily. 120 tablet 0  . lisinopril (PRINIVIL,ZESTRIL) 10 MG tablet Take 10 mg by mouth daily.    . Magnesium 250 MG TABS Take by mouth as directed.    . Multiple Vitamins-Minerals (MULTIVITAMIN WITH MINERALS) tablet Take 1 tablet by mouth daily.    . Multiple Vitamins-Minerals (OCUVITE PO) Take 1 tablet by mouth daily.    . Omega-3 Fatty Acids (FISH OIL) 1000 MG CAPS Take by mouth as directed.    . pramipexole (MIRAPEX) 0.125 MG tablet Take 0.25 mg by mouth at bedtime.    . vitamin E 400 UNIT capsule Take 400 Units by mouth daily.     No current facility-administered medications on file prior to visit.    ALLERGIES: Allergies  Allergen Reactions  . Demerol [Meperidine]     Sick on stomach   . Lunesta [Eszopiclone]     Speech     FAMILY HISTORY: Family History  Problem Relation Age of Onset  . Mitral valve prolapse Father   . Heart Problems Mother   . Breast cancer Mother   . Hypertension Mother     SOCIAL HISTORY: History   Social History  . Marital Status: Married    Spouse Name: Renee Zuniga    Number of Children: 5   . Years of Education: college   Occupational History  .      Retired   Social History Main Topics  . Smoking status: Never Smoker   . Smokeless tobacco: Never Used  . Alcohol Use: 0.6 oz/week    1 Glasses of wine per week     Comment: with dinner  . Drug Use: No  . Sexual Activity: Not Currently   Other Topics Concern  . Not on file   Social History Narrative   Patient is retired and lives at home with her husband Renee Zuniga). Patient has college education.    Caffeine- tea - four glasses.   Right handed.    REVIEW OF SYSTEMS: Constitutional: No fevers, chills, or sweats, no generalized fatigue, change in appetite Eyes: No visual changes, double vision, eye pain Ear, nose and throat: No hearing loss,  ear pain, nasal congestion, sore throat Cardiovascular: No chest pain, palpitations Respiratory:  No shortness of breath at rest or with exertion, wheezes GastrointestinaI: No nausea, vomiting, diarrhea, abdominal pain, fecal incontinence Genitourinary:  No dysuria, urinary retention or frequency Musculoskeletal:  No neck pain, back pain Integumentary: No rash, pruritus, skin lesions Neurological: as above Psychiatric: No depression, insomnia, anxiety Endocrine: No palpitations, fatigue, diaphoresis, mood swings, change in appetite, change in weight, increased thirst Hematologic/Lymphatic:  No anemia, purpura, petechiae. Allergic/Immunologic: no itchy/runny eyes, nasal congestion, recent allergic reactions, rashes  PHYSICAL EXAM: Filed Vitals:   12/25/14 0903  BP: 136/80  Pulse: 78  Resp: 16   General: No acute distress Head:  Normocephalic/atraumatic Eyes: Fundoscopic exam shows bilateral sharp discs, no vessel changes, exudates, or hemorrhages Neck: supple, no paraspinal tenderness, full range of motion Back: No paraspinal tenderness Heart: regular rate and rhythm Lungs: Clear to auscultation bilaterally. Vascular: No carotid bruits. Skin/Extremities: No rash, no  edema Neurological Exam: Mental status: alert and oriented to person, place, and time, no dysarthria or aphasia, Fund of knowledge is appropriate.  Recent and remote memory are intact.  Attention and concentration are normal.    Able to name objects and repeat phrases. MMSE - Mini Mental State Exam 12/25/2014  Orientation to time 5  Orientation to Place 5  Registration 3  Attention/ Calculation 5  Recall 3  Language- name 2 objects 2  Language- repeat 1  Language- follow 3 step command 3  Language- read & follow direction 1  Write a sentence 1  Copy design 1  Total score 30   Cranial nerves: CN I: not tested CN II: pupils equal, round and reactive to light, visual fields intact, fundi unremarkable. CN III, IV, VI:  full range of motion, no nystagmus, no ptosis CN V: facial sensation intact CN VII: upper and lower face symmetric CN VIII: hearing intact to finger rub CN IX, X: gag intact, uvula midline CN XI: sternocleidomastoid and trapezius muscles intact CN XII: tongue midline Bulk & Tone: normal, no fasciculations. Motor: 5/5 throughout with no pronator drift. Sensation: intact to light touch, cold, pin, vibration and joint position sense.  No extinction to double simultaneous stimulation.  Romberg test negative Deep Tendon Reflexes: +2 throughout, no ankle clonus Plantar responses: downgoing bilaterally Cerebellar: no incoordination on finger to nose, heel to shin. No dysdiadochokinesia Gait: narrow-based and steady, able to tandem walk adequately. Tremor: none  IMPRESSION: This is an 81 year old right-handed woman with a history of hypertension, hyperlipidemia, hypothyroidism, remote migraines, presenting with recurrent transient episodes of confusion and speech difficulties that she is amnestic of. Her neurological exam is normal. Considerations include partial seizures, complicated migraines (although she denies any headaches during these episodes), less likely TIA. There is  concern for short-term memory loss, MMSE normal. B12, TSH, RPR will be ordered. MRI brain unremarkable, her routine EEG in the hospital reported left temporal sharp waves and she was started on Keppra 500mg  BID. I had an extensive discussion with the patient separately, then with her husband and daughter afterwards, regarding the differential diagnosis. A 24-hour EEG will be helpful to further classify her symptoms. Continue current dose of Keppra, refills sent today. She understandably became upset regarding [place] driving laws that stipulate that after an episode of altered awareness, one should not drive until 6 months event-free. She will follow-up in 3 months.  Thank you for allowing me to participate in the care of this patient. Please do not hesitate to call  for any questions or concerns.   Renee Zuniga, M.D.  CC: Dr. Delfina Zuniga

## 2014-12-25 NOTE — Patient Instructions (Signed)
1. Schedule 24-hour EEG 2. We will plan for bloodwork (TSH, B12, RPR) when you come in for the EEG 3. Continue Keppra 500mg  twice a day 4. As per [place] driving laws, after an episode of loss of awareness, one should not drive until 6 months event-free 5. Follow-up in 3 months

## 2014-12-30 DIAGNOSIS — R404 Transient alteration of awareness: Secondary | ICD-10-CM | POA: Diagnosis not present

## 2014-12-30 DIAGNOSIS — G40009 Localization-related (focal) (partial) idiopathic epilepsy and epileptic syndromes with seizures of localized onset, not intractable, without status epilepticus: Secondary | ICD-10-CM | POA: Diagnosis not present

## 2014-12-31 ENCOUNTER — Ambulatory Visit: Payer: Medicare Other | Admitting: Neurology

## 2014-12-31 LAB — TSH: TSH: 1.534 u[IU]/mL (ref 0.350–4.500)

## 2014-12-31 LAB — VITAMIN B12: Vitamin B-12: 1111 pg/mL — ABNORMAL HIGH (ref 211–911)

## 2014-12-31 LAB — RPR

## 2015-01-02 ENCOUNTER — Encounter: Payer: Self-pay | Admitting: Neurology

## 2015-01-05 DIAGNOSIS — N183 Chronic kidney disease, stage 3 (moderate): Secondary | ICD-10-CM | POA: Diagnosis not present

## 2015-01-05 DIAGNOSIS — E039 Hypothyroidism, unspecified: Secondary | ICD-10-CM | POA: Diagnosis not present

## 2015-01-05 DIAGNOSIS — E785 Hyperlipidemia, unspecified: Secondary | ICD-10-CM | POA: Diagnosis not present

## 2015-01-05 DIAGNOSIS — R413 Other amnesia: Secondary | ICD-10-CM | POA: Diagnosis not present

## 2015-01-05 DIAGNOSIS — I10 Essential (primary) hypertension: Secondary | ICD-10-CM | POA: Diagnosis not present

## 2015-01-05 DIAGNOSIS — R9401 Abnormal electroencephalogram [EEG]: Secondary | ICD-10-CM | POA: Diagnosis not present

## 2015-01-20 ENCOUNTER — Ambulatory Visit (INDEPENDENT_AMBULATORY_CARE_PROVIDER_SITE_OTHER): Payer: Medicare Other | Admitting: Neurology

## 2015-01-20 DIAGNOSIS — R404 Transient alteration of awareness: Secondary | ICD-10-CM | POA: Diagnosis not present

## 2015-01-20 DIAGNOSIS — G40009 Localization-related (focal) (partial) idiopathic epilepsy and epileptic syndromes with seizures of localized onset, not intractable, without status epilepticus: Secondary | ICD-10-CM

## 2015-01-29 ENCOUNTER — Telehealth: Payer: Self-pay | Admitting: Family Medicine

## 2015-01-29 NOTE — Telephone Encounter (Signed)
-----   Message from Cameron Sprang, MD sent at 01/29/2015  3:58 PM EST ----- Regarding: EEG results Pls let her know EEG did not show any seizure discharges, we will discuss next steps on her f/u. Thanks

## 2015-01-29 NOTE — Telephone Encounter (Signed)
Lmom to return my call. 

## 2015-02-02 NOTE — Telephone Encounter (Signed)
Left msg on patient's husband cell phone to return my call.

## 2015-02-03 NOTE — Telephone Encounter (Signed)
Patient returned my call. Notified her of EEG results.

## 2015-02-08 ENCOUNTER — Other Ambulatory Visit: Payer: Self-pay

## 2015-02-08 DIAGNOSIS — Z1231 Encounter for screening mammogram for malignant neoplasm of breast: Secondary | ICD-10-CM

## 2015-02-12 ENCOUNTER — Encounter: Payer: Self-pay | Admitting: Neurology

## 2015-02-12 ENCOUNTER — Ambulatory Visit (INDEPENDENT_AMBULATORY_CARE_PROVIDER_SITE_OTHER): Payer: Medicare Other | Admitting: Neurology

## 2015-02-12 VITALS — BP 130/82 | HR 86 | Resp 16 | Ht 62.5 in | Wt 158.0 lb

## 2015-02-12 DIAGNOSIS — G40009 Localization-related (focal) (partial) idiopathic epilepsy and epileptic syndromes with seizures of localized onset, not intractable, without status epilepticus: Secondary | ICD-10-CM

## 2015-02-12 DIAGNOSIS — R404 Transient alteration of awareness: Secondary | ICD-10-CM

## 2015-02-12 NOTE — Progress Notes (Signed)
NEUROLOGY FOLLOW UP OFFICE NOTE  Korine Winton [id number]  HISTORY OF PRESENT ILLNESS: I had the pleasure of seeing Douglas Rooks in follow-up in the neurology clinic on 02/12/2015.  The patient was last seen 6 weeks ago for evaluation of recurrent episodes of confusion with abnormal inpatient EEG. She is again accompanied by her husband who supplements the history today.  Records and images were personally reviewed where available.  Her 24-hour EEG was within normal limits, with sharply contoured waveforms over the bilateral temporal regions, left greater than right, consistent with wicket spikes, no clear epileptiform discharges were seen.   She and her husband deny any further similar symptoms, no further confusional episodes. She denies any headaches, dizziness, diplopia, dysarthria, dysphagia, neck/back pain, focal numbness/tingling/weakness, bowel/bladder dysfunction. She denies any olfactory/gustatory hallucinations, gaps in time, myoclonic jerks. She is tolerating Keppra 500mg  BID without side effects. She has not been driving.   Laboratory Data: TSH, B12, RPR normal.  HPI: This is an 81 yo RH woman with a history of hypertension, hyperlipidemia, hypothyroidism, migraines, who presented with recurrent episodes of transient confusion. She reports the first episode occurred in July 2013 while having dinner with family. Per family, she stopped talking and had difficulty verbalizing. She was brought to the hospital in [place] where workup was unremarkable. She herself did not think she was having a difficult time speaking. She did not complain of a headache, the episode lasted 10 minutes or so. The second episode occurred in August 2014. At that time, she reports that she woke up feeling confused, went to see her PCP and kept writing the number 3 repeatedly on her paperwork. She was found to have doubled her night dose of Lunesta the night prior, and was back to baseline that afternoon. The  most recent episode occurred on 11/09/14, she recalls parking the car, came to the house and sat down looking at her keys. Her husband reports that she kept repeating that she did not know what to do with her keys, that she did not mean to do it, and that she couldn't find her keys. This lasted 6-7 minutes, then she didn't want to go to the hospital. Ms. Marcy states that she did not think there was anything wrong. She denied feeling dizzy, no headaches. She was brought to [facility], where she was also reported to have word-finding difficulties and unable to do routine tasks such as wrapping a gift. Her CBC, CMP were normal. I personally reviewed MRI brain without contrast which did not show any acute changes, there was generalized cerebral atrophy and mild chronic microvascular change. Hippocampi symmetric with no abnormal signal. She had an EEG which reported wickets in both temporal regions, as well as sharp waves over the left temporal region. She was started on Keppra 500mg  BID which she is tolerating without side effects.   Her family has noticed some short-term memory changes, she made a wrong turn driving one time. She feels her memory is fine. She has had migraines since her 81s, where she would get confused, no associated nausea, vomiting, photo/phonophobia. She has not had a migraine in many years. She denies any olfactory/gustatory hallucinations, rising epigastric sensation, myoclonic jerks. Her mother has migraines. She had been taking Librium for leg cramps, which have resolved.   Epilepsy Risk Factors: She had a normal birth and early development. There is no history of febrile convulsions, CNS infections such as meningitis/encephalitis, significant traumatic brain injury, neurosurgical procedures, or family history of seizures.  PAST MEDICAL HISTORY: Past Medical History  Diagnosis Date  . Arthritis   . Heart murmur   . High blood pressure   . Mitral valve prolapse   . Osteoarthritis   .  Vitamin D deficiency   . Migraine headache   . Hypercholesteremia   . Tinnitus   . Osteopenia   . Memory loss   . Essential hypertension, benign   . Insomnia     MEDICATIONS: Current Outpatient Prescriptions on File Prior to Visit  Medication Sig Dispense Refill  . aspirin 81 MG tablet Take 81 mg by mouth daily.    . calcium carbonate (OS-CAL - DOSED IN MG OF ELEMENTAL CALCIUM) 1250 MG tablet Take 1 tablet by mouth daily.    . chlordiazePOXIDE (LIBRIUM) 10 MG capsule Take 10 mg by mouth at bedtime.    . diclofenac sodium (VOLTAREN) 1 % GEL Apply 2 g topically 4 (four) times daily.    Marland Kitchen ezetimibe (ZETIA) 10 MG tablet Take 10 mg by mouth daily.    Marland Kitchen ibandronate (BONIVA) 150 MG tablet Take 150 mg by mouth every 30 (thirty) days. Take in the morning with a full glass of water, on an empty stomach, and do not take anything else by mouth or lie down for the next 30 min.    . levETIRAcetam (KEPPRA) 500 MG tablet Take 1 tablet (500 mg total) by mouth 2 (two) times daily. 180 tablet 3  . levothyroxine (SYNTHROID, LEVOTHROID) 50 MCG tablet Take 50 mcg by mouth daily before breakfast.    . lisinopril (PRINIVIL,ZESTRIL) 10 MG tablet Take 10 mg by mouth daily.    . Magnesium 250 MG TABS Take by mouth as directed.    . Multiple Vitamins-Minerals (MULTIVITAMIN WITH MINERALS) tablet Take 1 tablet by mouth daily.    . Multiple Vitamins-Minerals (OCUVITE PO) Take 1 tablet by mouth daily.    . Omega-3 Fatty Acids (FISH OIL) 1000 MG CAPS Take by mouth as directed.    . pramipexole (MIRAPEX) 0.125 MG tablet Take 0.25 mg by mouth at bedtime.    . vitamin E 400 UNIT capsule Take 400 Units by mouth daily.     No current facility-administered medications on file prior to visit.    ALLERGIES: Allergies  Allergen Reactions  . Demerol [Meperidine]     Sick on stomach   . Lunesta [Eszopiclone]     Speech     FAMILY HISTORY: Family History  Problem Relation Age of Onset  . Mitral valve prolapse Father    . Heart Problems Mother   . Breast cancer Mother   . Hypertension Mother     SOCIAL HISTORY: History   Social History  . Marital Status: Married    Spouse Name: Timmothy Sours  . Number of Children: 5  . Years of Education: college   Occupational History  .      Retired   Social History Main Topics  . Smoking status: Never Smoker   . Smokeless tobacco: Never Used  . Alcohol Use: 0.6 oz/week    1 Glasses of wine per week     Comment: with dinner  . Drug Use: No  . Sexual Activity: Not Currently   Other Topics Concern  . Not on file   Social History Narrative   Patient is retired and lives at home with her husband Timmothy Sours). Patient has college education.    Caffeine- tea - four glasses.   Right handed.    REVIEW OF SYSTEMS: Constitutional: No fevers, chills,  or sweats, no generalized fatigue, change in appetite Eyes: No visual changes, double vision, eye pain Ear, nose and throat: No hearing loss, ear pain, nasal congestion, sore throat Cardiovascular: No chest pain, palpitations Respiratory:  No shortness of breath at rest or with exertion, wheezes GastrointestinaI: No nausea, vomiting, diarrhea, abdominal pain, fecal incontinence Genitourinary:  No dysuria, urinary retention or frequency Musculoskeletal:  No neck pain, back pain Integumentary: No rash, pruritus, skin lesions Neurological: as above Psychiatric: No depression, insomnia, anxiety Endocrine: No palpitations, fatigue, diaphoresis, mood swings, change in appetite, change in weight, increased thirst Hematologic/Lymphatic:  No anemia, purpura, petechiae. Allergic/Immunologic: no itchy/runny eyes, nasal congestion, recent allergic reactions, rashes  PHYSICAL EXAM: Filed Vitals:   02/12/15 1106  BP: 130/82  Pulse: 86  Resp: 16   General: No acute distress Head:  Normocephalic/atraumatic Neck: supple, no paraspinal tenderness, full range of motion Heart:  Regular rate and rhythm Lungs:  Clear to auscultation  bilaterally Back: No paraspinal tenderness Skin/Extremities: No rash, no edema Neurological Exam: alert and oriented to person, place, and time. No aphasia or dysarthria. Fund of knowledge is appropriate.  Recent and remote memory are intact. 3/3 delayed recall.  Attention and concentration are normal.    Able to name objects and repeat phrases. Cranial nerves: Pupils equal, round, reactive to light.  Fundoscopic exam unremarkable, no papilledema. Extraocular movements intact with no nystagmus. Visual fields full. Facial sensation intact. No facial asymmetry. Tongue, uvula, palate midline.  Motor: Bulk and tone normal, muscle strength 5/5 throughout with no pronator drift.  Sensation to light touch, temperature and vibration intact.  No extinction to double simultaneous stimulation.  Deep tendon reflexes 2+ throughout, toes downgoing.  Finger to nose testing intact.  Gait narrow-based and steady, able to tandem walk adequately.  Romberg negative.  IMPRESSION: This is an 81 yo RH with a history of hypertension, hyperlipidemia, hypothyroidism, remote migraines, with recurrent transient episodes of confusion and speech difficulties that she is amnestic of. MRI brain unremarkable, her routine EEG in the hospital reported left temporal sharp waves and she was started on Keppra 500mg  BID. No further similar episodes since November 2015. Family and patient had concerns about the diagnosis, and a 24-hour EEG was done to further classify her symptoms. EEG did not show any epileptiform discharges, however patient is on Keppra already. We discussed continuation of Keppra 500mg  BID for now. She understands [place] driving laws that stipulate that after an episode of altered awareness, one should not drive until 6 months event-free. She will follow-up in 6 months and knows to call our office for any changes.   Thank you for allowing me to participate in her care.  Please do not hesitate to call for any questions or  concerns.  The duration of this appointment visit was 15 minutes of face-to-face time with the patient.  Greater than 50% of this time was spent in counseling, explanation of diagnosis, planning of further management, and coordination of care.   Ellouise Newer, M.D.   CC: Dr. Delfina Redwood

## 2015-02-12 NOTE — Patient Instructions (Addendum)
1. Continue Keppra 500mg  twice a day 2. Call our office for any changes, follow-up in 6 months  Seizure Precautions: 1. If medication has been prescribed for you to prevent seizures, take it exactly as directed.  Do not stop taking the medicine without talking to your doctor first, even if you have not had a seizure in a long time.   2. Avoid activities in which a seizure would cause danger to yourself or to others.  Don't operate dangerous machinery, swim alone, or climb in high or dangerous places, such as on ladders, roofs, or girders.  Do not drive unless your doctor says you may.  3. If you have any warning that you may have a seizure, lay down in a safe place where you can't hurt yourself.    4.  No driving for 6 months from last seizure, as per [facility].   Please refer to the following link on the [place] website for more information: http://[email or web address]/[email or web address]/[email or web address]   5.  Maintain good sleep hygiene.  6.  Contact your doctor if you have any problems that may be related to the medicine you are taking.  7.  Call 911 and bring the patient back to the ED if:        A.  The seizure lasts longer than 5 minutes.       B.  The patient doesn't awaken shortly after the seizure  C.  The patient has new problems such as difficulty seeing, speaking or moving  D.  The patient was injured during the seizure  E.  The patient has a temperature over 102 F (39C)  F.  The patient vomited and now is having trouble breathing

## 2015-02-17 NOTE — Procedures (Signed)
ELECTROENCEPHALOGRAM REPORT  Dates of Recording: 01/20/2015 to 01/21/2015  Patient's Name: Renee Zuniga MRN: [id number] Date of Birth: 09-07-1931  Referring Provider: Dr. Ellouise Newer  Procedure: 24-hour ambulatory EEG  History: This is an 81 year old woman with recurrent transient episodes of confusion and speech difficulties that she is amnestic of.   Medications: Keppra  Technical Summary: This is a 24-hour multichannel digital EEG recording measured by the international 10-20 system with electrodes applied with paste and impedances below 5000 ohms performed as portable with EKG monitoring.  The digital EEG was referentially recorded, reformatted, and digitally filtered in a variety of bipolar and referential montages for optimal display.    DESCRIPTION OF RECORDING: During maximal wakefulness, the background activity consisted of a symmetric 9 Hz posterior dominant rhythm which was reactive to eye opening.  There were no epileptiform discharges or focal slowing seen in wakefulness.  During the recording, the patient progresses through wakefulness, drowsiness, and Stage 2 sleep. During drowsiness and sleep, there is an increase in theta slowing of the background, at times sharply contoured over the bilateral temporal regions, left greater than right, without clear epileptogenic potential.  Again, there were no clear epileptiform discharges seen.  Events: There were no push button events. Typical symptoms were not reported.  There were no electrographic seizures seen.  EKG lead showed occasional extrasystolic beats.  IMPRESSION: This 24-hour ambulatory EEG study is within normal limits for age.  CLINICAL CORRELATION: A normal EEG does not exclude a clinical diagnosis of epilepsy.  If further clinical questions remain, inpatient video EEG monitoring may be helpful.   Ellouise Newer, M.D.

## 2015-02-23 ENCOUNTER — Ambulatory Visit: Payer: Medicare Other | Admitting: Neurology

## 2015-03-08 ENCOUNTER — Other Ambulatory Visit: Payer: Medicare Other

## 2015-03-26 ENCOUNTER — Ambulatory Visit: Payer: Medicare Other | Admitting: Neurology

## 2015-03-29 ENCOUNTER — Ambulatory Visit: Payer: Medicare Other | Admitting: Neurology

## 2015-03-30 ENCOUNTER — Ambulatory Visit
Admission: RE | Admit: 2015-03-30 | Discharge: 2015-03-30 | Disposition: A | Discharge status: Home / Self Care | Payer: Medicare Other | Source: Ambulatory Visit

## 2015-03-30 DIAGNOSIS — Z1231 Encounter for screening mammogram for malignant neoplasm of breast: Secondary | ICD-10-CM

## 2015-03-30 DIAGNOSIS — H2513 Age-related nuclear cataract, bilateral: Secondary | ICD-10-CM | POA: Diagnosis not present

## 2015-04-05 DIAGNOSIS — E039 Hypothyroidism, unspecified: Secondary | ICD-10-CM | POA: Diagnosis not present

## 2015-04-05 DIAGNOSIS — R413 Other amnesia: Secondary | ICD-10-CM | POA: Diagnosis not present

## 2015-04-05 DIAGNOSIS — N183 Chronic kidney disease, stage 3 (moderate): Secondary | ICD-10-CM | POA: Diagnosis not present

## 2015-04-05 DIAGNOSIS — N8110 Cystocele, unspecified: Secondary | ICD-10-CM | POA: Diagnosis not present

## 2015-04-05 DIAGNOSIS — M899 Disorder of bone, unspecified: Secondary | ICD-10-CM | POA: Diagnosis not present

## 2015-04-05 DIAGNOSIS — E785 Hyperlipidemia, unspecified: Secondary | ICD-10-CM | POA: Diagnosis not present

## 2015-04-05 DIAGNOSIS — I10 Essential (primary) hypertension: Secondary | ICD-10-CM | POA: Diagnosis not present

## 2015-05-06 ENCOUNTER — Emergency Department (HOSPITAL_COMMUNITY)
Admission: EM | Admit: 2015-05-06 | Discharge: 2015-05-06 | Disposition: A | Discharge status: Home / Self Care | Payer: Medicare Other | Attending: Emergency Medicine | Admitting: Emergency Medicine

## 2015-05-06 ENCOUNTER — Encounter (HOSPITAL_COMMUNITY): Payer: Self-pay | Admitting: *Deleted

## 2015-05-06 DIAGNOSIS — Z8669 Personal history of other diseases of the nervous system and sense organs: Secondary | ICD-10-CM | POA: Diagnosis not present

## 2015-05-06 DIAGNOSIS — Z791 Long term (current) use of non-steroidal anti-inflammatories (NSAID): Secondary | ICD-10-CM | POA: Insufficient documentation

## 2015-05-06 DIAGNOSIS — Z7982 Long term (current) use of aspirin: Secondary | ICD-10-CM | POA: Diagnosis not present

## 2015-05-06 DIAGNOSIS — F05 Delirium due to known physiological condition: Secondary | ICD-10-CM | POA: Diagnosis not present

## 2015-05-06 DIAGNOSIS — E780 Pure hypercholesterolemia: Secondary | ICD-10-CM | POA: Insufficient documentation

## 2015-05-06 DIAGNOSIS — Z79899 Other long term (current) drug therapy: Secondary | ICD-10-CM | POA: Insufficient documentation

## 2015-05-06 DIAGNOSIS — R4182 Altered mental status, unspecified: Secondary | ICD-10-CM | POA: Diagnosis not present

## 2015-05-06 DIAGNOSIS — M8580 Other specified disorders of bone density and structure, unspecified site: Secondary | ICD-10-CM | POA: Insufficient documentation

## 2015-05-06 DIAGNOSIS — R011 Cardiac murmur, unspecified: Secondary | ICD-10-CM | POA: Diagnosis not present

## 2015-05-06 DIAGNOSIS — F4489 Other dissociative and conversion disorders: Secondary | ICD-10-CM | POA: Diagnosis not present

## 2015-05-06 DIAGNOSIS — M1990 Unspecified osteoarthritis, unspecified site: Secondary | ICD-10-CM | POA: Diagnosis not present

## 2015-05-06 DIAGNOSIS — I6789 Other cerebrovascular disease: Secondary | ICD-10-CM | POA: Diagnosis not present

## 2015-05-06 DIAGNOSIS — I10 Essential (primary) hypertension: Secondary | ICD-10-CM | POA: Diagnosis not present

## 2015-05-06 DIAGNOSIS — R410 Disorientation, unspecified: Secondary | ICD-10-CM | POA: Diagnosis not present

## 2015-05-06 LAB — BASIC METABOLIC PANEL
Anion gap: 10 (ref 5–15)
BUN: 26 mg/dL — ABNORMAL HIGH (ref 6–20)
CO2: 22 mmol/L (ref 22–32)
Calcium: 9.5 mg/dL (ref 8.9–10.3)
Chloride: 106 mmol/L (ref 101–111)
Creatinine, Ser: 0.98 mg/dL (ref 0.44–1.00)
GFR calc Af Amer: >60 mL/min (ref >60)
GFR calc non Af Amer: 53 mL/min — ABNORMAL LOW (ref >60)
Glucose, Bld: 108 mg/dL — ABNORMAL HIGH (ref 65–99)
Potassium: 4.1 mmol/L (ref 3.5–5.1)
Sodium: 138 mmol/L (ref 135–145)

## 2015-05-06 LAB — CBC WITH DIFFERENTIAL/PLATELET
Basophils Absolute: 0 10*3/uL (ref 0.0–0.1)
Basophils Relative: 1 % (ref 0–1)
Eosinophils Absolute: 0.1 10*3/uL (ref 0.0–0.7)
Eosinophils Relative: 1 % (ref 0–5)
HCT: 38.3 % (ref 36.0–46.0)
Hemoglobin: 12.3 g/dL (ref 12.0–15.0)
Lymphocytes Relative: 26 % (ref 12–46)
Lymphs Abs: 1.6 10*3/uL (ref 0.7–4.0)
MCH: 30.4 pg (ref 26.0–34.0)
MCHC: 32.1 g/dL (ref 30.0–36.0)
MCV: 94.8 fL (ref 78.0–100.0)
Monocytes Absolute: 0.6 10*3/uL (ref 0.1–1.0)
Monocytes Relative: 10 % (ref 3–12)
Neutro Abs: 3.9 10*3/uL (ref 1.7–7.7)
Neutrophils Relative %: 62 % (ref 43–77)
Platelets: 229 10*3/uL (ref 150–400)
RBC: 4.04 MIL/uL (ref 3.87–5.11)
RDW: 13.8 % (ref 11.5–15.5)
WBC: 6.2 10*3/uL (ref 4.0–10.5)

## 2015-05-06 LAB — URINALYSIS, ROUTINE W REFLEX MICROSCOPIC
Bilirubin Urine: NEGATIVE
Glucose, UA: NEGATIVE mg/dL
Hgb urine dipstick: NEGATIVE
Ketones, ur: NEGATIVE mg/dL
Nitrite: NEGATIVE
Protein, ur: NEGATIVE mg/dL
Specific Gravity, Urine: 1.013 (ref 1.005–1.030)
Urobilinogen, UA: 0.2 mg/dL (ref 0.0–1.0)
pH: 6.5 (ref 5.0–8.0)

## 2015-05-06 LAB — URINE MICROSCOPIC-ADD ON

## 2015-05-06 LAB — TROPONIN I: Troponin I: <0.03 ng/mL (ref <0.031)

## 2015-05-06 NOTE — ED Notes (Signed)
Pt arrives from home via [facility]. Pt had a period of confusion for about 15 minutes and seemed to forget recent events. Pt has had episodes like this in the past and was dx with focal seizures and is taking Keppra BID consistently. Last episode was on 11/07/14. Pt was confused upon EMS arrival but has since returned to baseline. EMS stroke screen was negative.

## 2015-05-06 NOTE — Discharge Instructions (Signed)
Confusion Confusion is the inability to think with your usual speed or clarity. Confusion may come on quickly or slowly over time. How quickly the confusion comes on depends on the cause. Confusion can be due to any number of causes. CAUSES   Concussion, head injury, or head trauma.  Seizures.  Stroke.  Fever.  Brain tumor.  Age related decreased brain function (dementia).  Heightened emotional states like rage or terror.  Mental illness in which the person loses the ability to determine what is real and what is not (hallucinations).  Infections such as a urinary tract infection (UTI).  Toxic effects from alcohol, drugs, or prescription medicines.  Dehydration and an imbalance of salts in the body (electrolytes).  Lack of sleep.  Low blood sugar (diabetes).  Low levels of oxygen from conditions such as chronic lung disorders.  Drug interactions or other medicine side effects.  Nutritional deficiencies, especially niacin, thiamine, vitamin C, or vitamin B.  Sudden drop in body temperature (hypothermia).  Change in routine, such as when traveling or hospitalized. SIGNS AND SYMPTOMS  People often describe their thinking as cloudy or unclear when they are confused. Confusion can also include feeling disoriented. That means you are unaware of where or who you are. You may also not know what the date or time is. If confused, you may also have difficulty paying attention, remembering, and making decisions. Some people also act aggressively when they are confused.  DIAGNOSIS  The medical evaluation of confusion may include:  Blood and urine tests.  X-rays.  Brain and nervous system tests.  Analyzing your brain waves (electroencephalogram or EEG).  Magnetic resonance imaging (MRI) of your head.  Computed tomography (CT) scan of your head.  Mental status tests in which your health care provider may ask many questions. Some of these questions may seem silly or strange,  but they are a very important test to help diagnose and treat confusion. TREATMENT  An admission to the hospital may not be needed, but a person with confusion should not be left alone. Stay with a family member or friend until the confusion clears. Avoid alcohol, pain relievers, or sedative drugs until you have fully recovered. Do not drive until directed by your health care provider. HOME CARE INSTRUCTIONS  What family and friends can do:  To find out if someone is confused, ask the person to state his or her name, age, and the date. If the person is unsure or answers incorrectly, he or she is confused.  Always introduce yourself, no matter how well the person knows you.  Often remind the person of his or her location.  Place a calendar and clock near the confused person.  Help the person with his or her medicines. You may want to use a pill box, an alarm as a reminder, or give the person each dose as prescribed.  Talk about current events and plans for the day.  Try to keep the environment calm, quiet, and peaceful.  Make sure the person keeps follow-up visits with his or her health care provider. PREVENTION  Ways to prevent confusion:  Avoid alcohol.  Eat a balanced diet.  Get enough sleep.  Take medicine only as directed by your health care provider.  Do not become isolated. Spend time with other people and make plans for your days.  Keep careful watch on your blood sugar levels if you are diabetic. SEEK IMMEDIATE MEDICAL CARE IF:   You develop severe headaches, repeated vomiting, seizures, blackouts, or   slurred speech.  There is increasing confusion, weakness, numbness, restlessness, or personality changes.  You develop a loss of balance, have marked dizziness, feel uncoordinated, or fall.  You have delusions, hallucinations, or develop severe anxiety.  Your family members think you need to be rechecked. Document Released: 01/04/2005 Document Revised: 04/13/2014  Document Reviewed: 01/02/2014 [employer] Patient Information 2015 [employer]. This information is not intended to replace advice given to you by your health care provider. Make sure you discuss any questions you have with your health care provider.  

## 2015-05-06 NOTE — ED Provider Notes (Signed)
CSN: [id number]     Arrival date & time 05/06/15  1814 History   First MD Initiated Contact with Patient 05/06/15 1847     Chief Complaint  Patient presents with  . Altered Mental Status     (Consider location/radiation/quality/duration/timing/severity/associated sxs/prior Treatment) HPI The patient had a 15 minute episode of confusion. She was getting ready to go out to an evening event changed her activities and said she was going to bed. This was not what she was in the process of doing and family members found her to be confused to recall of certain people's names and what she had done earlier in the day. There was not noted to be any focal neurologic dysfunction. Her gait remained intact and there was no extremity weakness. The symptoms resolved in approximately 15 minutes. The patient denies any headache or any other associated symptoms. She states she has been well recently with a negative review of systems. She had a similar type of episode although it was longer and more pronounced in 25-Nov-2023 of last year. A complete diagnostic workup did not identify a specific etiology although it was presumed to be a type of seizure versus a complex migraine. She is on daily Keppra. Past Medical History  Diagnosis Date  . Arthritis   . Heart murmur   . High blood pressure   . Mitral valve prolapse   . Osteoarthritis   . Vitamin D deficiency   . Migraine headache   . Hypercholesteremia   . Tinnitus   . Osteopenia   . Memory loss   . Essential hypertension, benign   . Insomnia    Past Surgical History  Procedure Laterality Date  . Cholecystectomy    . Partial hysterectomy  1974    due to uterine prolaspe  . Gallbladder surgery    . Correction hammer toe Bilateral   . Oophorectomy      benign tumor, per patient   Family History  Problem Relation Age of Onset  . Mitral valve prolapse Father   . Heart Problems Mother   . Breast cancer Mother   . Hypertension Mother    History   Substance Use Topics  . Smoking status: Never Smoker   . Smokeless tobacco: Never Used  . Alcohol Use: 0.6 oz/week    1 Glasses of wine per week     Comment: with dinner   OB History    No data available     Review of Systems  10 Systems reviewed and are negative for acute change except as noted in the HPI.   Allergies  Demerol and Lunesta  Home Medications   Prior to Admission medications   Medication Sig Start Date End Date Taking? Authorizing Provider  aspirin 81 MG tablet Take 81 mg by mouth daily.    Historical Provider, MD  calcium carbonate (OS-CAL - DOSED IN MG OF ELEMENTAL CALCIUM) 1250 MG tablet Take 1 tablet by mouth daily.    Historical Provider, MD  chlordiazePOXIDE (LIBRIUM) 10 MG capsule Take 10 mg by mouth at bedtime.    Historical Provider, MD  diclofenac sodium (VOLTAREN) 1 % GEL Apply 2 g topically 4 (four) times daily.    Historical Provider, MD  ezetimibe (ZETIA) 10 MG tablet Take 10 mg by mouth daily.    Historical Provider, MD  ibandronate (BONIVA) 150 MG tablet Take 150 mg by mouth every 30 (thirty) days. Take in the morning with a full glass of water, on an empty stomach, and do  not take anything else by mouth or lie down for the next 30 min.    Historical Provider, MD  levETIRAcetam (KEPPRA) 500 MG tablet Take 1 tablet (500 mg total) by mouth 2 (two) times daily. 12/25/14   Cameron Sprang, MD  levothyroxine (SYNTHROID, LEVOTHROID) 50 MCG tablet Take 50 mcg by mouth daily before breakfast.    Historical Provider, MD  lisinopril (PRINIVIL,ZESTRIL) 10 MG tablet Take 10 mg by mouth daily.    Historical Provider, MD  Magnesium 250 MG TABS Take by mouth as directed.    Historical Provider, MD  Multiple Vitamins-Minerals (MULTIVITAMIN WITH MINERALS) tablet Take 1 tablet by mouth daily.    Historical Provider, MD  Multiple Vitamins-Minerals (OCUVITE PO) Take 1 tablet by mouth daily.    Historical Provider, MD  Omega-3 Fatty Acids (FISH OIL) 1000 MG CAPS Take by  mouth as directed.    Historical Provider, MD  pramipexole (MIRAPEX) 0.125 MG tablet Take 0.25 mg by mouth at bedtime.    Historical Provider, MD  vitamin E 400 UNIT capsule Take 400 Units by mouth daily.    Historical Provider, MD   BP 149/71 mmHg  Pulse 80  Resp 20  Ht 5\' 2"  (1.575 m)  Wt 155 lb (70.308 kg)  BMI 28.34 kg/m2  SpO2 99% Physical Exam  Constitutional: She is oriented to person, place, and time. She appears well-developed and well-nourished.  HENT:  Head: Normocephalic and atraumatic.  Eyes: EOM are normal. Pupils are equal, round, and reactive to light.  Neck: Neck supple.  Cardiovascular: Normal rate, regular rhythm, normal heart sounds and intact distal pulses.   Pulmonary/Chest: Effort normal and breath sounds normal.  Abdominal: Soft. Bowel sounds are normal. She exhibits no distension. There is no tenderness.  Musculoskeletal: Normal range of motion. She exhibits no edema.  Neurological: She is alert and oriented to person, place, and time. She has normal strength. No cranial nerve deficit. She exhibits normal muscle tone. Coordination normal. GCS eye subscore is 4. GCS verbal subscore is 5. GCS motor subscore is 6.  Normal finger-nose examination. Normal sensation to light touch 4 extremities.  Skin: Skin is warm, dry and intact.  Psychiatric: She has a normal mood and affect.    ED Course  Procedures (including critical care time) Labs Review Labs Reviewed  URINALYSIS, ROUTINE W REFLEX MICROSCOPIC (NOT AT [facility]) - Abnormal; Notable for the following:    Leukocytes, UA TRACE (*)    All other components within normal limits  BASIC METABOLIC PANEL - Abnormal; Notable for the following:    Glucose, Bld 108 (*)    BUN 26 (*)    GFR calc non Af Amer 53 (*)    All other components within normal limits  URINE MICROSCOPIC-ADD ON - Abnormal; Notable for the following:    Squamous Epithelial / LPF FEW (*)    All other components within normal limits  CBC WITH  DIFFERENTIAL/PLATELET  TROPONIN I    Imaging Review No results found.   EKG Interpretation None      MDM   Final diagnoses:  Acute confusional state   Symptoms were self-limited. Patient has had extensive workup for similar presentation. At this time there is no indication of change in symptomatology to suggest the need for repeat diagnostic CT or MRI. The patient is counseled to follow up with her neurologist and return should there be any recurrence of concerning symptoms such as confusion, weakness, headache.    Charlesetta Shanks, MD 05/06/15 2022

## 2015-05-07 ENCOUNTER — Telehealth: Payer: Self-pay | Admitting: Neurology

## 2015-05-07 NOTE — Telephone Encounter (Signed)
Pt's daughter Sarh called and wanted to speak to someone about whether or not her mom should be driving/dawn

## 2015-05-07 NOTE — Telephone Encounter (Signed)
Patient had another episode yesterday that lasted for 25 minutes.  She did not where she was or who she was and kept trying to take her clothes off wanting to get into bed.  EMS was called and patient went to the hospital.  Her daughter is concerned about her driving because her 6 month period of no driving is up tomorrow.  Her dad has unplugged the battery to the car for now.  Daughter would like to have a follow up for the patient to be evaluated again.  Please advise.  She is aware that Dr. Delice Lesch is out of the office until next week.

## 2015-05-11 NOTE — Telephone Encounter (Signed)
I spoke with patient's daughter/Renee Zuniga. She is concerned about her mother with having the episodes of confusion. Patient did have an ER visit on 5/26 due to having another episode. She is also concerned because her mother is ready to start driving again since she has been 6  Months seizure free. She wanted to see if patient could be seen sooner than Sept which is when her next follow-up is scheduled. I told her that I could move her appt up for her to have a follow-up since this recent episode. Also advised for them to look into patient getting a driving assessment done by the [facility] to determine if patient should drive or not, she stated that they were looking into that. She is going to speak with her father/patient's husband and discuss best time for the appt. She is going to call me back so we can schedule.

## 2015-05-13 DIAGNOSIS — M25562 Pain in left knee: Secondary | ICD-10-CM | POA: Diagnosis not present

## 2015-05-13 DIAGNOSIS — M25561 Pain in right knee: Secondary | ICD-10-CM | POA: Diagnosis not present

## 2015-05-13 DIAGNOSIS — M170 Bilateral primary osteoarthritis of knee: Secondary | ICD-10-CM | POA: Diagnosis not present

## 2015-06-07 ENCOUNTER — Other Ambulatory Visit: Payer: Self-pay

## 2015-06-17 ENCOUNTER — Ambulatory Visit (INDEPENDENT_AMBULATORY_CARE_PROVIDER_SITE_OTHER): Payer: Medicare Other | Admitting: Family Medicine

## 2015-06-17 VITALS — BP 120/72 | HR 89 | Temp 97.8°F | Resp 18 | Ht 64.0 in | Wt 164.0 lb

## 2015-06-17 DIAGNOSIS — T148 Other injury of unspecified body region: Secondary | ICD-10-CM | POA: Diagnosis not present

## 2015-06-17 DIAGNOSIS — L089 Local infection of the skin and subcutaneous tissue, unspecified: Secondary | ICD-10-CM | POA: Diagnosis not present

## 2015-06-17 DIAGNOSIS — T148XXA Other injury of unspecified body region, initial encounter: Secondary | ICD-10-CM

## 2015-06-17 MED ORDER — MUPIROCIN 2 % EX OINT: TOPICAL_OINTMENT | CUTANEOUS | Status: DC

## 2015-06-17 NOTE — Progress Notes (Signed)
Chief Complaint:  Chief Complaint  Patient presents with   infected finger    left thumb x4 days     HPI: Renee Zuniga is a 81 y.o. female who is here for infection on left thumb for the past 4 days. She was hammering nails to hang up pictures on a wooden wall. She denies fever, and chills. She denies biting her fingernails. No history of MRSA. No similar symptoms in the past.   Past Medical History  Diagnosis Date   Arthritis    Heart murmur    High blood pressure    Mitral valve prolapse    Osteoarthritis    Vitamin D deficiency    Migraine headache    Hypercholesteremia    Tinnitus    Osteopenia    Memory loss    Essential hypertension, benign    Insomnia    Past Surgical History  Procedure Laterality Date   Cholecystectomy     Partial hysterectomy  1974    due to uterine prolaspe   Gallbladder surgery     Correction hammer toe Bilateral    Oophorectomy      benign tumor, per patient   History   Social History   Marital Status: Married    Spouse Name: Don   Number of Children: 5   Years of Education: college   Occupational History        Retired   Social History Main Topics   Smoking status: Never Smoker    Smokeless tobacco: Never Used   Alcohol Use: 0.6 oz/week    1 Glasses of wine per week     Comment: with dinner   Drug Use: No   Sexual Activity: Not Currently   Other Topics Concern   None   Social History Narrative   Patient is retired and lives at home with her husband Timmothy Sours). Patient has college education.    Caffeine- tea - four glasses.   Right handed.   Family History  Problem Relation Age of Onset   Mitral valve prolapse Father    Heart Problems Mother    Breast cancer Mother    Hypertension Mother    Allergies  Allergen Reactions   Ambien [Zolpidem] Nausea And Vomiting   Demerol [Meperidine]     Sick on stomach    Lunesta [Eszopiclone]     Speech    Prior to Admission  medications   Medication Sig Start Date End Date Taking? Authorizing Provider  aspirin 81 MG tablet Take 81 mg by mouth daily.   Yes Historical Provider, MD  calcium carbonate (OS-CAL - DOSED IN MG OF ELEMENTAL CALCIUM) 1250 MG tablet Take 1 tablet by mouth daily.   Yes Historical Provider, MD  chlordiazePOXIDE (LIBRIUM) 10 MG capsule Take 10 mg by mouth at bedtime.   Yes Historical Provider, MD  diclofenac sodium (VOLTAREN) 1 % GEL Apply 2 g topically 4 (four) times daily.   Yes Historical Provider, MD  ezetimibe (ZETIA) 10 MG tablet Take 10 mg by mouth daily.   Yes Historical Provider, MD  ibandronate (BONIVA) 150 MG tablet Take 150 mg by mouth every 30 (thirty) days. Take in the morning with a full glass of water, on an empty stomach, and do not take anything else by mouth or lie down for the next 30 min.   Yes Historical Provider, MD  levETIRAcetam (KEPPRA) 500 MG tablet Take 1 tablet (500 mg total) by mouth 2 (two) times daily. 12/25/14  Yes Cameron Sprang, MD  levothyroxine (SYNTHROID, LEVOTHROID) 50 MCG tablet Take 50 mcg by mouth daily before breakfast.   Yes Historical Provider, MD  lisinopril (PRINIVIL,ZESTRIL) 10 MG tablet Take 10 mg by mouth daily.   Yes Historical Provider, MD  Magnesium 250 MG TABS Take by mouth as directed.   Yes Historical Provider, MD  Multiple Vitamins-Minerals (MULTIVITAMIN WITH MINERALS) tablet Take 1 tablet by mouth daily.   Yes Historical Provider, MD  Multiple Vitamins-Minerals (OCUVITE PO) Take 1 tablet by mouth daily.   Yes Historical Provider, MD  Omega-3 Fatty Acids (FISH OIL) 1000 MG CAPS Take by mouth as directed.   Yes Historical Provider, MD  pramipexole (MIRAPEX) 0.125 MG tablet Take 0.25 mg by mouth at bedtime.   Yes Historical Provider, MD  vitamin E 400 UNIT capsule Take 400 Units by mouth daily.   Yes Historical Provider, MD     ROS: The patient denies fevers, chills, night sweats, unintentional weight loss, chest pain, palpitations, wheezing,  dyspnea on exertion, nausea, vomiting, abdominal pain, dysuria, hematuria, melena, numbness, weakness, or tingling.  All other systems have been reviewed and were otherwise negative with the exception of those mentioned in the HPI and as above.    PHYSICAL EXAM: Filed Vitals:   06/17/15 0830  BP: 120/72  Pulse: 89  Temp: 97.8 F (36.6 C)  Resp: 18    Body mass index is 28.14 kg/(m^2).   General: Alert, no acute distress HEENT:  Normocephalic, atraumatic, oropharynx patent. EOMI, PERRLA Cardiovascular:  Regular rate and rhythm, no rubs murmurs or gallops.  No Carotid bruits, radial pulse intact. No pedal edema.  Respiratory: Clear to auscultation bilaterally.  No wheezes, rales, or rhonchi.  No cyanosis, no use of accessory musculature GI: No organomegaly, abdomen is soft and non-tender, positive bowel sounds.  No masses. Skin: Positive minimally localized erythema, warmth, no abscess on left medial aspect of thumb. Splinter was removed without complications. Neurologic: Facial musculature symmetric. Psychiatric: Patient is appropriate throughout our interaction. Lymphatic: No cervical lymphadenopathy Musculoskeletal: Gait intact.   LABS: Results for orders placed or performed during the hospital encounter of 05/06/15  Urinalysis, Routine w reflex microscopic  Result Value Ref Range   Color, Urine YELLOW YELLOW   APPearance CLEAR CLEAR   Specific Gravity, Urine 1.013 1.005 - 1.030   pH 6.5 5.0 - 8.0   Glucose, UA NEGATIVE NEGATIVE mg/dL   Hgb urine dipstick NEGATIVE NEGATIVE   Bilirubin Urine NEGATIVE NEGATIVE   Ketones, ur NEGATIVE NEGATIVE mg/dL   Protein, ur NEGATIVE NEGATIVE mg/dL   Urobilinogen, UA 0.2 0.0 - 1.0 mg/dL   Nitrite NEGATIVE NEGATIVE   Leukocytes, UA TRACE (A) NEGATIVE  Basic metabolic panel  Result Value Ref Range   Sodium 138 135 - 145 mmol/L   Potassium 4.1 3.5 - 5.1 mmol/L   Chloride 106 101 - 111 mmol/L   CO2 22 22 - 32 mmol/L   Glucose, Bld 108  (H) 65 - 99 mg/dL   BUN 26 (H) 6 - 20 mg/dL   Creatinine, Ser 0.98 0.44 - 1.00 mg/dL   Calcium 9.5 8.9 - 10.3 mg/dL   GFR calc non Af Amer 53 (L) >60 mL/min   GFR calc Af Amer >60 >60 mL/min   Anion gap 10 5 - 15  CBC with Differential  Result Value Ref Range   WBC 6.2 4.0 - 10.5 K/uL   RBC 4.04 3.87 - 5.11 MIL/uL   Hemoglobin 12.3 12.0 - 15.0 g/dL  HCT 38.3 36.0 - 46.0 %   MCV 94.8 78.0 - 100.0 fL   MCH 30.4 26.0 - 34.0 pg   MCHC 32.1 30.0 - 36.0 g/dL   RDW 13.8 11.5 - 15.5 %   Platelets 229 150 - 400 K/uL   Neutrophils Relative % 62 43 - 77 %   Neutro Abs 3.9 1.7 - 7.7 K/uL   Lymphocytes Relative 26 12 - 46 %   Lymphs Abs 1.6 0.7 - 4.0 K/uL   Monocytes Relative 10 3 - 12 %   Monocytes Absolute 0.6 0.1 - 1.0 K/uL   Eosinophils Relative 1 0 - 5 %   Eosinophils Absolute 0.1 0.0 - 0.7 K/uL   Basophils Relative 1 0 - 1 %   Basophils Absolute 0.0 0.0 - 0.1 K/uL  Troponin I  Result Value Ref Range   Troponin I <0.03 <0.031 ng/mL  Urine microscopic-add on  Result Value Ref Range   Squamous Epithelial / LPF FEW (A) RARE   WBC, UA 0-2 <3 WBC/hpf   RBC / HPF 0-2 <3 RBC/hpf     EKG/XRAY:   Primary read interpreted by Dr. Marin Comment at [facility].   ASSESSMENT/PLAN: 1. Infected left thumb-minimal and localized 2. Splinter removal Patient was given Bactroban topical ointment for use twice a day after washing with soap and water. She was given precautions to return if she has worsening redness, warmth, pain.  I personally performed the services described in this documentation, which was scribed in my presence. The recorded information has been reviewed and considered. Dr Fidel Levy sideeffects, risk and benefits, and alternatives of medications d/w patient. Patient is aware that all medications have potential sideeffects and we are unable to predict every sideeffect or drug-drug interaction that may occur.  Dr. Tilden Fossa Marin Comment 06/17/2015 9:05 AM

## 2015-06-29 DIAGNOSIS — M1710 Unilateral primary osteoarthritis, unspecified knee: Secondary | ICD-10-CM | POA: Diagnosis not present

## 2015-06-29 DIAGNOSIS — M25572 Pain in left ankle and joints of left foot: Secondary | ICD-10-CM | POA: Diagnosis not present

## 2015-06-29 DIAGNOSIS — Z79899 Other long term (current) drug therapy: Secondary | ICD-10-CM | POA: Diagnosis not present

## 2015-06-29 DIAGNOSIS — M79672 Pain in left foot: Secondary | ICD-10-CM | POA: Diagnosis not present

## 2015-07-05 DIAGNOSIS — R350 Frequency of micturition: Secondary | ICD-10-CM | POA: Diagnosis not present

## 2015-07-05 DIAGNOSIS — F4489 Other dissociative and conversion disorders: Secondary | ICD-10-CM | POA: Diagnosis not present

## 2015-07-05 DIAGNOSIS — E785 Hyperlipidemia, unspecified: Secondary | ICD-10-CM | POA: Diagnosis not present

## 2015-07-05 DIAGNOSIS — R413 Other amnesia: Secondary | ICD-10-CM | POA: Diagnosis not present

## 2015-07-05 DIAGNOSIS — N183 Chronic kidney disease, stage 3 (moderate): Secondary | ICD-10-CM | POA: Diagnosis not present

## 2015-07-05 DIAGNOSIS — I10 Essential (primary) hypertension: Secondary | ICD-10-CM | POA: Diagnosis not present

## 2015-07-05 DIAGNOSIS — E039 Hypothyroidism, unspecified: Secondary | ICD-10-CM | POA: Diagnosis not present

## 2015-07-30 ENCOUNTER — Telehealth: Payer: Self-pay | Admitting: Neurology

## 2015-07-30 NOTE — Telephone Encounter (Signed)
I spoke with her she states that Dr. Charlestine Night was maybe going to start her on Tramadol, but he wanted to know 1st if it would be ok for her to take with her being on Keppra.

## 2015-07-30 NOTE — Telephone Encounter (Signed)
Call pt at/ [phone] /she wouldn't tell me the reason

## 2015-07-30 NOTE — Telephone Encounter (Signed)
Pt called and wants to know if she can take tramdol with the keppra please call [phone]

## 2015-07-30 NOTE — Telephone Encounter (Signed)
If there are any other options, would do that first. But if no other options, would minimize intake as much as she can, as high doses can lower threshold to have a seizure. Thanks

## 2015-07-30 NOTE — Telephone Encounter (Signed)
Called patient back and notified her of advisement. Patient verbalized good understanding.

## 2015-08-12 ENCOUNTER — Encounter: Payer: Self-pay | Admitting: Neurology

## 2015-08-12 ENCOUNTER — Ambulatory Visit (INDEPENDENT_AMBULATORY_CARE_PROVIDER_SITE_OTHER): Payer: Medicare Other | Admitting: Neurology

## 2015-08-12 VITALS — BP 122/72 | HR 86 | Resp 16 | Ht 62.5 in | Wt 166.0 lb

## 2015-08-12 DIAGNOSIS — R413 Other amnesia: Secondary | ICD-10-CM | POA: Diagnosis not present

## 2015-08-12 DIAGNOSIS — G40009 Localization-related (focal) (partial) idiopathic epilepsy and epileptic syndromes with seizures of localized onset, not intractable, without status epilepticus: Secondary | ICD-10-CM

## 2015-08-12 HISTORY — DX: Localization-related (focal) (partial) idiopathic epilepsy and epileptic syndromes with seizures of localized onset, not intractable, without status epilepticus: G40.009

## 2015-08-12 MED ORDER — LEVETIRACETAM 500 MG PO TABS: ORAL_TABLET | ORAL | Status: DC

## 2015-08-12 NOTE — Patient Instructions (Signed)
1. Increase Keppra 500mg : Take 1-1/2 tablets twice a day 2. Continue to monitor symptoms, follow-up in 6 months, call for any changes 3. No driving for 6 months after last seizure  Seizure Precautions: 1. If medication has been prescribed for you to prevent seizures, take it exactly as directed.  Do not stop taking the medicine without talking to your doctor first, even if you have not had a seizure in a long time.   2. Avoid activities in which a seizure would cause danger to yourself or to others.  Don't operate dangerous machinery, swim alone, or climb in high or dangerous places, such as on ladders, roofs, or girders.  Do not drive unless your doctor says you may.  3. If you have any warning that you may have a seizure, lay down in a safe place where you can't hurt yourself.    4.  No driving for 6 months from last seizure, as per [facility].   Please refer to the following link on the [place] website for more information: http://[email or web address].org/[email or web address]/driving/drivingu.cfm   5.  Maintain good sleep hygiene.  6.  Contact your doctor if you have any problems that may be related to the medicine you are taking.  7.  Call 911 and bring the patient back to the ED if:        A.  The seizure lasts longer than 5 minutes.       B.  The patient doesn't awaken shortly after the seizure  C.  The patient has new problems such as difficulty seeing, speaking or moving  D.  The patient was injured during the seizure  E.  The patient has a temperature over 102 F (39C)  F.  The patient vomited and now is having trouble breathing
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
## 2015-08-17 ENCOUNTER — Ambulatory Visit: Payer: Medicare Other | Admitting: Neurology

## 2015-08-20 DIAGNOSIS — L570 Actinic keratosis: Secondary | ICD-10-CM | POA: Diagnosis not present

## 2015-08-20 DIAGNOSIS — D2272 Melanocytic nevi of left lower limb, including hip: Secondary | ICD-10-CM | POA: Diagnosis not present

## 2015-08-20 DIAGNOSIS — L821 Other seborrheic keratosis: Secondary | ICD-10-CM | POA: Diagnosis not present

## 2015-08-25 DIAGNOSIS — M25572 Pain in left ankle and joints of left foot: Secondary | ICD-10-CM | POA: Diagnosis not present

## 2015-08-26 DIAGNOSIS — M2142 Flat foot [pes planus] (acquired), left foot: Secondary | ICD-10-CM | POA: Diagnosis not present

## 2015-08-26 DIAGNOSIS — M19071 Primary osteoarthritis, right ankle and foot: Secondary | ICD-10-CM | POA: Diagnosis not present

## 2015-08-26 DIAGNOSIS — M19072 Primary osteoarthritis, left ankle and foot: Secondary | ICD-10-CM | POA: Diagnosis not present

## 2015-08-26 DIAGNOSIS — M2141 Flat foot [pes planus] (acquired), right foot: Secondary | ICD-10-CM | POA: Diagnosis not present

## 2015-08-31 DIAGNOSIS — L853 Xerosis cutis: Secondary | ICD-10-CM | POA: Diagnosis not present

## 2015-08-31 DIAGNOSIS — L570 Actinic keratosis: Secondary | ICD-10-CM | POA: Diagnosis not present

## 2015-09-11 ENCOUNTER — Ambulatory Visit (INDEPENDENT_AMBULATORY_CARE_PROVIDER_SITE_OTHER): Payer: Medicare Other | Admitting: Physician Assistant

## 2015-09-11 VITALS — BP 142/70 | HR 78 | Temp 98.0°F | Resp 16 | Ht 62.5 in | Wt 168.4 lb

## 2015-09-11 DIAGNOSIS — S81801A Unspecified open wound, right lower leg, initial encounter: Secondary | ICD-10-CM

## 2015-09-11 NOTE — Progress Notes (Signed)
Patient ID: Renee Zuniga, female    DOB: 24-Aug-1931, 81 y.o.   MRN: [id number]  PCP: Kandice Hams, MD  Chief Complaint  Patient presents with  . Leg Injury    Right shin, dropped furniture on leg a week ago and has a sore on leg    Subjective:  HPI Presents for evaluation of a wound on the RIGHT lower shin sustained 5 days ago when she dropped a piece of furniture on it. She is leaving town today for a one week visit to [place] and wants to be sure that it's not infected.  She has been cleaning the area daily, applying a thick layer of antibiotic ointment and covering it as needed. She removed a large scab from the area several days ago because she didn't want it to "heal too quickly," having had that happen to her in the past.  No fever or chills. No increased redness, no red streaking. No swelling. No increasing pain.    Review of Systems  As above.   Patient Active Problem List   Diagnosis Date Noted  . Localization-related idiopathic epilepsy and epileptic syndromes with seizures of localized onset, not intractable, without status epilepticus 08/12/2015  . TIA (transient ischemic attack) 11/09/2014  . Acute confusion 11/09/2014  . HTN (hypertension) 11/09/2014  . Bilateral foot pain 03/31/2014  . Pes planus 03/31/2014  . Subluxation of ankle joint 03/31/2014  . Acute confusional state 08/26/2013  . Arthritis   . Heart murmur   . High blood pressure   . Mitral valve prolapse   . Osteoarthritis   . Vitamin D deficiency   . Migraine headache   . Hypercholesteremia   . Tinnitus   . Memory loss     Allergies  Allergen Reactions  . Ambien [Zolpidem] Nausea And Vomiting  . Demerol [Meperidine]     Sick on stomach   . Lunesta [Eszopiclone]     Speech     Prior to Admission medications   Medication Sig Start Date End Date Taking? Authorizing Provider  aspirin 81 MG tablet Take 81 mg by mouth daily.   Yes Historical Provider, MD  calcium  carbonate (OS-CAL - DOSED IN MG OF ELEMENTAL CALCIUM) 1250 MG tablet Take 1 tablet by mouth daily.   Yes Historical Provider, MD  chlordiazePOXIDE (LIBRIUM) 10 MG capsule Take 10 mg by mouth at bedtime.   Yes Historical Provider, MD  diclofenac sodium (VOLTAREN) 1 % GEL Apply 2 g topically 4 (four) times daily.   Yes Historical Provider, MD  ezetimibe (ZETIA) 10 MG tablet Take 10 mg by mouth daily.   Yes Historical Provider, MD  ibandronate (BONIVA) 150 MG tablet Take 150 mg by mouth every 30 (thirty) days. Take in the morning with a full glass of water, on an empty stomach, and do not take anything else by mouth or lie down for the next 30 min.   Yes Historical Provider, MD  levETIRAcetam (KEPPRA) 500 MG tablet Take 1-1/2 tablets twice a day 08/12/15  Yes Cameron Sprang, MD  levothyroxine (SYNTHROID, LEVOTHROID) 50 MCG tablet Take 50 mcg by mouth daily before breakfast.   Yes Historical Provider, MD  lisinopril (PRINIVIL,ZESTRIL) 10 MG tablet Take 10 mg by mouth daily.   Yes Historical Provider, MD  Magnesium 250 MG TABS Take by mouth as directed.   Yes Historical Provider, MD  Multiple Vitamins-Minerals (MULTIVITAMIN WITH MINERALS) tablet Take 1 tablet by mouth daily.   Yes Historical Provider, MD  Multiple Vitamins-Minerals (OCUVITE PO) Take 1 tablet by mouth daily.   Yes Historical Provider, MD  Omega-3 Fatty Acids (FISH OIL) 1000 MG CAPS Take by mouth as directed.   Yes Historical Provider, MD  pramipexole (MIRAPEX) 0.125 MG tablet Take 0.25 mg by mouth at bedtime.   Yes Historical Provider, MD  vitamin E 400 UNIT capsule Take 400 Units by mouth daily.   Yes Historical Provider, MD     Past Medical, Surgical Family and Social History reviewed and updated.        Objective:  Physical Exam  Constitutional: She is oriented to person, place, and time. She appears well-developed and well-nourished. She is active and cooperative. No distress.  BP 142/70 mmHg  Pulse 78  Temp(Src) 98 F (36.7  C) (Oral)  Resp 16  Ht 5' 2.5" (1.588 m)  Wt 168 lb 6.4 oz (76.386 kg)  BMI 30.29 kg/m2  SpO2 96%   Eyes: Conjunctivae are normal.  Pulmonary/Chest: Effort normal.  Neurological: She is alert and oriented to person, place, and time.  Skin: Abrasion (resolving deep abrasion on the anterior lower tibia, just above the ankle.) noted. No rash noted. No erythema.  Mild stasis dermatitis noted on the RIGHT anterior tibia.  Psychiatric: She has a normal mood and affect. Her speech is normal and behavior is normal.           Assessment & Plan:  1. Wound, open, leg, right, initial encounter Local wound care. Encouraged a thin layer of antibiotic ointment and leaving the area uncovered as much as possible. Wash daily with soap and water, and advised against use of H2O2 on the wound. Monitor for increasing pain, swelling, redness, red streaking, or development of fever, for which she should seek re-evaluation.   Fara Chute, PA-C Physician Assistant-Certified [facility]

## 2015-10-12 DIAGNOSIS — E039 Hypothyroidism, unspecified: Secondary | ICD-10-CM | POA: Diagnosis not present

## 2015-10-12 DIAGNOSIS — I10 Essential (primary) hypertension: Secondary | ICD-10-CM | POA: Diagnosis not present

## 2015-10-12 DIAGNOSIS — E785 Hyperlipidemia, unspecified: Secondary | ICD-10-CM | POA: Diagnosis not present

## 2015-10-12 DIAGNOSIS — N183 Chronic kidney disease, stage 3 (moderate): Secondary | ICD-10-CM | POA: Diagnosis not present

## 2015-10-12 DIAGNOSIS — R569 Unspecified convulsions: Secondary | ICD-10-CM | POA: Diagnosis not present

## 2015-11-11 DIAGNOSIS — M79672 Pain in left foot: Secondary | ICD-10-CM | POA: Diagnosis not present

## 2015-11-11 DIAGNOSIS — M1710 Unilateral primary osteoarthritis, unspecified knee: Secondary | ICD-10-CM | POA: Diagnosis not present

## 2015-12-07 DIAGNOSIS — M170 Bilateral primary osteoarthritis of knee: Secondary | ICD-10-CM | POA: Diagnosis not present

## 2015-12-07 DIAGNOSIS — L218 Other seborrheic dermatitis: Secondary | ICD-10-CM | POA: Diagnosis not present

## 2015-12-17 DIAGNOSIS — G2581 Restless legs syndrome: Secondary | ICD-10-CM | POA: Diagnosis not present

## 2016-01-13 DIAGNOSIS — E039 Hypothyroidism, unspecified: Secondary | ICD-10-CM | POA: Diagnosis not present

## 2016-01-13 DIAGNOSIS — G2581 Restless legs syndrome: Secondary | ICD-10-CM | POA: Diagnosis not present

## 2016-01-13 DIAGNOSIS — I10 Essential (primary) hypertension: Secondary | ICD-10-CM | POA: Diagnosis not present

## 2016-01-13 DIAGNOSIS — R413 Other amnesia: Secondary | ICD-10-CM | POA: Diagnosis not present

## 2016-01-13 DIAGNOSIS — N183 Chronic kidney disease, stage 3 (moderate): Secondary | ICD-10-CM | POA: Diagnosis not present

## 2016-01-25 DIAGNOSIS — M1711 Unilateral primary osteoarthritis, right knee: Secondary | ICD-10-CM | POA: Diagnosis not present

## 2016-01-25 DIAGNOSIS — G4762 Sleep related leg cramps: Secondary | ICD-10-CM | POA: Diagnosis not present

## 2016-01-25 DIAGNOSIS — M1712 Unilateral primary osteoarthritis, left knee: Secondary | ICD-10-CM | POA: Diagnosis not present

## 2016-01-25 DIAGNOSIS — M79672 Pain in left foot: Secondary | ICD-10-CM | POA: Diagnosis not present

## 2016-01-26 ENCOUNTER — Other Ambulatory Visit: Payer: Self-pay | Admitting: Internal Medicine

## 2016-01-26 DIAGNOSIS — M7989 Other specified soft tissue disorders: Secondary | ICD-10-CM | POA: Diagnosis not present

## 2016-01-26 DIAGNOSIS — R609 Edema, unspecified: Secondary | ICD-10-CM

## 2016-01-26 DIAGNOSIS — M79605 Pain in left leg: Secondary | ICD-10-CM | POA: Diagnosis not present

## 2016-01-26 DIAGNOSIS — M7122 Synovial cyst of popliteal space [Baker], left knee: Secondary | ICD-10-CM | POA: Diagnosis not present

## 2016-02-11 ENCOUNTER — Ambulatory Visit: Payer: Medicare Other | Admitting: Neurology

## 2016-02-21 ENCOUNTER — Other Ambulatory Visit: Payer: Self-pay

## 2016-02-21 DIAGNOSIS — R0981 Nasal congestion: Secondary | ICD-10-CM | POA: Diagnosis not present

## 2016-02-21 DIAGNOSIS — M1712 Unilateral primary osteoarthritis, left knee: Secondary | ICD-10-CM | POA: Diagnosis not present

## 2016-02-21 DIAGNOSIS — M25572 Pain in left ankle and joints of left foot: Secondary | ICD-10-CM | POA: Diagnosis not present

## 2016-02-21 DIAGNOSIS — M1711 Unilateral primary osteoarthritis, right knee: Secondary | ICD-10-CM | POA: Diagnosis not present

## 2016-02-21 DIAGNOSIS — Z1231 Encounter for screening mammogram for malignant neoplasm of breast: Secondary | ICD-10-CM

## 2016-02-21 DIAGNOSIS — G4762 Sleep related leg cramps: Secondary | ICD-10-CM | POA: Diagnosis not present

## 2016-02-24 ENCOUNTER — Ambulatory Visit: Payer: Medicare Other

## 2016-03-15 DIAGNOSIS — H903 Sensorineural hearing loss, bilateral: Secondary | ICD-10-CM | POA: Diagnosis not present

## 2016-03-22 DIAGNOSIS — L570 Actinic keratosis: Secondary | ICD-10-CM | POA: Diagnosis not present

## 2016-04-10 ENCOUNTER — Ambulatory Visit
Admission: RE | Admit: 2016-04-10 | Discharge: 2016-04-10 | Disposition: A | Discharge status: Home / Self Care | Payer: Medicare Other | Source: Ambulatory Visit

## 2016-04-10 DIAGNOSIS — Z1231 Encounter for screening mammogram for malignant neoplasm of breast: Secondary | ICD-10-CM | POA: Diagnosis not present

## 2016-04-11 DIAGNOSIS — N993 Prolapse of vaginal vault after hysterectomy: Secondary | ICD-10-CM | POA: Diagnosis not present

## 2016-04-11 DIAGNOSIS — Z9079 Acquired absence of other genital organ(s): Secondary | ICD-10-CM | POA: Diagnosis not present

## 2016-04-11 DIAGNOSIS — M170 Bilateral primary osteoarthritis of knee: Secondary | ICD-10-CM | POA: Diagnosis not present

## 2016-04-11 DIAGNOSIS — Z90722 Acquired absence of ovaries, bilateral: Secondary | ICD-10-CM | POA: Diagnosis not present

## 2016-04-11 DIAGNOSIS — R350 Frequency of micturition: Secondary | ICD-10-CM | POA: Diagnosis not present

## 2016-04-12 DIAGNOSIS — Z90722 Acquired absence of ovaries, bilateral: Secondary | ICD-10-CM | POA: Diagnosis not present

## 2016-04-12 DIAGNOSIS — N993 Prolapse of vaginal vault after hysterectomy: Secondary | ICD-10-CM | POA: Diagnosis not present

## 2016-04-12 DIAGNOSIS — R350 Frequency of micturition: Secondary | ICD-10-CM | POA: Diagnosis not present

## 2016-04-12 DIAGNOSIS — H5213 Myopia, bilateral: Secondary | ICD-10-CM | POA: Diagnosis not present

## 2016-04-12 DIAGNOSIS — Z9079 Acquired absence of other genital organ(s): Secondary | ICD-10-CM | POA: Diagnosis not present

## 2016-04-12 DIAGNOSIS — H2513 Age-related nuclear cataract, bilateral: Secondary | ICD-10-CM | POA: Diagnosis not present

## 2016-04-13 DIAGNOSIS — R569 Unspecified convulsions: Secondary | ICD-10-CM | POA: Diagnosis not present

## 2016-04-13 DIAGNOSIS — E7800 Pure hypercholesterolemia, unspecified: Secondary | ICD-10-CM | POA: Diagnosis not present

## 2016-04-13 DIAGNOSIS — G2581 Restless legs syndrome: Secondary | ICD-10-CM | POA: Diagnosis not present

## 2016-04-13 DIAGNOSIS — E039 Hypothyroidism, unspecified: Secondary | ICD-10-CM | POA: Diagnosis not present

## 2016-04-13 DIAGNOSIS — R413 Other amnesia: Secondary | ICD-10-CM | POA: Diagnosis not present

## 2016-04-13 DIAGNOSIS — N183 Chronic kidney disease, stage 3 (moderate): Secondary | ICD-10-CM | POA: Diagnosis not present

## 2016-04-13 DIAGNOSIS — I10 Essential (primary) hypertension: Secondary | ICD-10-CM | POA: Diagnosis not present

## 2016-04-18 DIAGNOSIS — N952 Postmenopausal atrophic vaginitis: Secondary | ICD-10-CM | POA: Diagnosis not present

## 2016-04-18 DIAGNOSIS — Z90710 Acquired absence of both cervix and uterus: Secondary | ICD-10-CM | POA: Diagnosis not present

## 2016-04-18 DIAGNOSIS — R8299 Other abnormal findings in urine: Secondary | ICD-10-CM | POA: Diagnosis not present

## 2016-04-18 DIAGNOSIS — Z79899 Other long term (current) drug therapy: Secondary | ICD-10-CM | POA: Diagnosis not present

## 2016-04-18 DIAGNOSIS — Z888 Allergy status to other drugs, medicaments and biological substances status: Secondary | ICD-10-CM | POA: Diagnosis not present

## 2016-04-18 DIAGNOSIS — Z885 Allergy status to narcotic agent status: Secondary | ICD-10-CM | POA: Diagnosis not present

## 2016-04-18 DIAGNOSIS — Z90721 Acquired absence of ovaries, unilateral: Secondary | ICD-10-CM | POA: Diagnosis not present

## 2016-04-18 DIAGNOSIS — N993 Prolapse of vaginal vault after hysterectomy: Secondary | ICD-10-CM | POA: Diagnosis not present

## 2016-04-18 DIAGNOSIS — R350 Frequency of micturition: Secondary | ICD-10-CM | POA: Diagnosis not present

## 2016-05-01 DIAGNOSIS — H6123 Impacted cerumen, bilateral: Secondary | ICD-10-CM | POA: Diagnosis not present

## 2016-06-26 DIAGNOSIS — M170 Bilateral primary osteoarthritis of knee: Secondary | ICD-10-CM | POA: Diagnosis not present

## 2016-06-29 DIAGNOSIS — N183 Chronic kidney disease, stage 3 (moderate): Secondary | ICD-10-CM | POA: Diagnosis not present

## 2016-06-29 DIAGNOSIS — E7800 Pure hypercholesterolemia, unspecified: Secondary | ICD-10-CM | POA: Diagnosis not present

## 2016-06-29 DIAGNOSIS — E039 Hypothyroidism, unspecified: Secondary | ICD-10-CM | POA: Diagnosis not present

## 2016-07-03 DIAGNOSIS — M170 Bilateral primary osteoarthritis of knee: Secondary | ICD-10-CM | POA: Diagnosis not present

## 2016-07-10 DIAGNOSIS — M170 Bilateral primary osteoarthritis of knee: Secondary | ICD-10-CM | POA: Diagnosis not present

## 2016-07-25 DIAGNOSIS — M25572 Pain in left ankle and joints of left foot: Secondary | ICD-10-CM | POA: Diagnosis not present

## 2016-07-25 DIAGNOSIS — M1711 Unilateral primary osteoarthritis, right knee: Secondary | ICD-10-CM | POA: Diagnosis not present

## 2016-07-25 DIAGNOSIS — M1712 Unilateral primary osteoarthritis, left knee: Secondary | ICD-10-CM | POA: Diagnosis not present

## 2016-08-16 DIAGNOSIS — M170 Bilateral primary osteoarthritis of knee: Secondary | ICD-10-CM | POA: Diagnosis not present

## 2016-08-17 ENCOUNTER — Other Ambulatory Visit: Payer: Self-pay

## 2016-08-19 ENCOUNTER — Emergency Department (HOSPITAL_COMMUNITY): Payer: Medicare Other

## 2016-08-19 ENCOUNTER — Observation Stay (HOSPITAL_COMMUNITY): Payer: Medicare Other

## 2016-08-19 ENCOUNTER — Encounter (HOSPITAL_COMMUNITY): Payer: Self-pay | Admitting: Emergency Medicine

## 2016-08-19 ENCOUNTER — Inpatient Hospital Stay (HOSPITAL_COMMUNITY)
Admission: EM | Admit: 2016-08-19 | Discharge: 2016-08-20 | DRG: 101 | Disposition: A | Discharge status: Home / Self Care | Payer: Medicare Other | Attending: Family Medicine | Admitting: Family Medicine

## 2016-08-19 DIAGNOSIS — E7800 Pure hypercholesterolemia, unspecified: Secondary | ICD-10-CM | POA: Diagnosis present

## 2016-08-19 DIAGNOSIS — Z79899 Other long term (current) drug therapy: Secondary | ICD-10-CM

## 2016-08-19 DIAGNOSIS — R0902 Hypoxemia: Secondary | ICD-10-CM | POA: Diagnosis present

## 2016-08-19 DIAGNOSIS — G40901 Epilepsy, unspecified, not intractable, with status epilepticus: Secondary | ICD-10-CM | POA: Diagnosis present

## 2016-08-19 DIAGNOSIS — I10 Essential (primary) hypertension: Secondary | ICD-10-CM | POA: Diagnosis present

## 2016-08-19 DIAGNOSIS — E039 Hypothyroidism, unspecified: Secondary | ICD-10-CM | POA: Diagnosis present

## 2016-08-19 DIAGNOSIS — Z888 Allergy status to other drugs, medicaments and biological substances status: Secondary | ICD-10-CM

## 2016-08-19 DIAGNOSIS — R569 Unspecified convulsions: Secondary | ICD-10-CM | POA: Diagnosis not present

## 2016-08-19 DIAGNOSIS — R4701 Aphasia: Secondary | ICD-10-CM | POA: Diagnosis present

## 2016-08-19 DIAGNOSIS — R600 Localized edema: Secondary | ICD-10-CM | POA: Diagnosis not present

## 2016-08-19 DIAGNOSIS — R7989 Other specified abnormal findings of blood chemistry: Secondary | ICD-10-CM | POA: Diagnosis present

## 2016-08-19 DIAGNOSIS — R4182 Altered mental status, unspecified: Secondary | ICD-10-CM | POA: Diagnosis not present

## 2016-08-19 DIAGNOSIS — R29818 Other symptoms and signs involving the nervous system: Secondary | ICD-10-CM | POA: Diagnosis not present

## 2016-08-19 LAB — COMPREHENSIVE METABOLIC PANEL WITH GFR
ALT: 28 U/L (ref 14–54)
AST: 23 U/L (ref 15–41)
Albumin: 4 g/dL (ref 3.5–5.0)
Alkaline Phosphatase: 59 U/L (ref 38–126)
Anion gap: 12 (ref 5–15)
BUN: 31 mg/dL — ABNORMAL HIGH (ref 6–20)
CO2: 21 mmol/L — ABNORMAL LOW (ref 22–32)
Calcium: 9.4 mg/dL (ref 8.9–10.3)
Chloride: 105 mmol/L (ref 101–111)
Creatinine, Ser: 1.03 mg/dL — ABNORMAL HIGH (ref 0.44–1.00)
GFR calc Af Amer: 57 mL/min — ABNORMAL LOW
GFR calc non Af Amer: 49 mL/min — ABNORMAL LOW
Glucose, Bld: 116 mg/dL — ABNORMAL HIGH (ref 65–99)
Potassium: 4.3 mmol/L (ref 3.5–5.1)
Sodium: 138 mmol/L (ref 135–145)
Total Bilirubin: 0.4 mg/dL (ref 0.3–1.2)
Total Protein: 6.6 g/dL (ref 6.5–8.1)

## 2016-08-19 LAB — CBG MONITORING, ED
Glucose-Capillary: 111 mg/dL — ABNORMAL HIGH (ref 65–99)
Glucose-Capillary: 101 mg/dL — ABNORMAL HIGH (ref 65–99)

## 2016-08-19 LAB — I-STAT CHEM 8, ED
BUN: 31 mg/dL — ABNORMAL HIGH (ref 6–20)
Calcium, Ion: 1.17 mmol/L (ref 1.15–1.40)
Chloride: 105 mmol/L (ref 101–111)
Creatinine, Ser: 1 mg/dL (ref 0.44–1.00)
Glucose, Bld: 113 mg/dL — ABNORMAL HIGH (ref 65–99)
HCT: 39 % (ref 36.0–46.0)
Hemoglobin: 13.3 g/dL (ref 12.0–15.0)
Potassium: 4.4 mmol/L (ref 3.5–5.1)
Sodium: 139 mmol/L (ref 135–145)
TCO2: 22 mmol/L (ref 0–100)

## 2016-08-19 LAB — I-STAT TROPONIN, ED: Troponin i, poc: 0 ng/mL (ref 0.00–0.08)

## 2016-08-19 LAB — URINE MICROSCOPIC-ADD ON

## 2016-08-19 LAB — RAPID URINE DRUG SCREEN, HOSP PERFORMED
Amphetamines: NOT DETECTED
Barbiturates: NOT DETECTED
Benzodiazepines: NOT DETECTED
Cocaine: NOT DETECTED
Opiates: NOT DETECTED
Tetrahydrocannabinol: NOT DETECTED

## 2016-08-19 LAB — URINALYSIS, ROUTINE W REFLEX MICROSCOPIC
Bilirubin Urine: NEGATIVE
Glucose, UA: NEGATIVE mg/dL
Hgb urine dipstick: NEGATIVE
Ketones, ur: NEGATIVE mg/dL
Nitrite: NEGATIVE
Protein, ur: NEGATIVE mg/dL
Specific Gravity, Urine: 1.016 (ref 1.005–1.030)
pH: 5.5 (ref 5.0–8.0)

## 2016-08-19 LAB — CBC
HCT: 39.4 % (ref 36.0–46.0)
HCT: 36.9 % (ref 36.0–46.0)
Hemoglobin: 12.9 g/dL (ref 12.0–15.0)
Hemoglobin: 11.7 g/dL — ABNORMAL LOW (ref 12.0–15.0)
MCH: 31.1 pg (ref 26.0–34.0)
MCH: 30.3 pg (ref 26.0–34.0)
MCHC: 32.7 g/dL (ref 30.0–36.0)
MCHC: 31.7 g/dL (ref 30.0–36.0)
MCV: 94.9 fL (ref 78.0–100.0)
MCV: 95.6 fL (ref 78.0–100.0)
Platelets: 257 K/uL (ref 150–400)
Platelets: 246 10*3/uL (ref 150–400)
RBC: 4.15 MIL/uL (ref 3.87–5.11)
RBC: 3.86 MIL/uL — ABNORMAL LOW (ref 3.87–5.11)
RDW: 13.6 % (ref 11.5–15.5)
RDW: 13.7 % (ref 11.5–15.5)
WBC: 6.9 K/uL (ref 4.0–10.5)
WBC: 6.8 10*3/uL (ref 4.0–10.5)

## 2016-08-19 LAB — ETHANOL: Alcohol, Ethyl (B): <5 mg/dL (ref <5)

## 2016-08-19 LAB — APTT: aPTT: 26 seconds (ref 24–36)

## 2016-08-19 LAB — DIFFERENTIAL
Basophils Absolute: 0 K/uL (ref 0.0–0.1)
Basophils Relative: 0 %
Eosinophils Absolute: 0.1 K/uL (ref 0.0–0.7)
Eosinophils Relative: 1 %
Lymphocytes Relative: 36 %
Lymphs Abs: 2.5 K/uL (ref 0.7–4.0)
Monocytes Absolute: 0.7 K/uL (ref 0.1–1.0)
Monocytes Relative: 10 %
Neutro Abs: 3.6 K/uL (ref 1.7–7.7)
Neutrophils Relative %: 53 %

## 2016-08-19 LAB — PROTIME-INR
INR: 1.02
Prothrombin Time: 13.4 s (ref 11.4–15.2)

## 2016-08-19 LAB — CREATININE, SERUM
Creatinine, Ser: 0.84 mg/dL (ref 0.44–1.00)
GFR calc Af Amer: >60 mL/min (ref >60)
GFR calc non Af Amer: >60 mL/min (ref >60)

## 2016-08-19 MED ORDER — LISINOPRIL 10 MG PO TABS
10.0000 mg | ORAL_TABLET | Freq: Every day | ORAL | Status: DC
  Administered 2016-08-20: 10 mg via ORAL
  Filled 2016-08-19: qty 1

## 2016-08-19 MED ORDER — TRAMADOL HCL 50 MG PO TABS
50.0000 mg | ORAL_TABLET | Freq: Three times a day (TID) | ORAL | Status: DC
  Administered 2016-08-19: 50 mg via ORAL
  Filled 2016-08-19: qty 1

## 2016-08-19 MED ORDER — SODIUM CHLORIDE 0.9 % IV SOLN
1000.0000 mg | Freq: Once | INTRAVENOUS | Status: AC
  Administered 2016-08-19: 1000 mg via INTRAVENOUS
  Filled 2016-08-19: qty 10

## 2016-08-19 MED ORDER — LEVOTHYROXINE SODIUM 50 MCG PO TABS
50.0000 ug | ORAL_TABLET | Freq: Every day | ORAL | Status: DC
  Administered 2016-08-20: 50 ug via ORAL
  Filled 2016-08-19: qty 1

## 2016-08-19 MED ORDER — LORAZEPAM 2 MG/ML IJ SOLN
INTRAMUSCULAR | Status: AC
  Filled 2016-08-19: qty 1

## 2016-08-19 MED ORDER — CHLORDIAZEPOXIDE HCL 5 MG PO CAPS
10.0000 mg | ORAL_CAPSULE | Freq: Every day | ORAL | Status: DC
  Administered 2016-08-20: 10 mg via ORAL
  Filled 2016-08-19: qty 2

## 2016-08-19 MED ORDER — SODIUM CHLORIDE 0.9% FLUSH
3.0000 mL | Freq: Two times a day (BID) | INTRAVENOUS | Status: DC
  Administered 2016-08-19: 3 mL via INTRAVENOUS

## 2016-08-19 MED ORDER — SODIUM CHLORIDE 0.9 % IV SOLN
1000.0000 mg | Freq: Two times a day (BID) | INTRAVENOUS | Status: DC
  Administered 2016-08-19: 1000 mg via INTRAVENOUS
  Filled 2016-08-19 (×3): qty 10

## 2016-08-19 MED ORDER — EZETIMIBE 10 MG PO TABS
10.0000 mg | ORAL_TABLET | Freq: Every day | ORAL | Status: DC
  Administered 2016-08-19: 10 mg via ORAL
  Filled 2016-08-19: qty 1

## 2016-08-19 MED ORDER — ADULT MULTIVITAMIN W/MINERALS CH
1.0000 | ORAL_TABLET | Freq: Every day | ORAL | Status: DC
  Administered 2016-08-20: 1 via ORAL
  Filled 2016-08-19 (×3): qty 1

## 2016-08-19 MED ORDER — ENOXAPARIN SODIUM 40 MG/0.4ML ~~LOC~~ SOLN
40.0000 mg | SUBCUTANEOUS | Status: DC
  Administered 2016-08-19: 40 mg via SUBCUTANEOUS
  Filled 2016-08-19: qty 0.4

## 2016-08-19 MED ORDER — LORAZEPAM 2 MG/ML IJ SOLN
1.0000 mg | Freq: Once | INTRAMUSCULAR | Status: AC
  Administered 2016-08-19: 1 mg via INTRAVENOUS
  Filled 2016-08-19: qty 1

## 2016-08-19 MED ORDER — PRAMIPEXOLE DIHYDROCHLORIDE 0.25 MG PO TABS
0.3750 mg | ORAL_TABLET | Freq: Every day | ORAL | Status: DC
  Administered 2016-08-19: 0.375 mg via ORAL
  Filled 2016-08-19: qty 1

## 2016-08-19 MED ORDER — CLOBETASOL PROPIONATE 0.05 % EX SOLN: 1.0000 "application " | CUTANEOUS | Status: DC

## 2016-08-19 MED ORDER — DICLOFENAC SODIUM 1 % TD GEL
2.0000 g | Freq: Every day | TRANSDERMAL | Status: DC
  Filled 2016-08-19: qty 100

## 2016-08-19 MED ORDER — DEXTROSE-NACL 5-0.45 % IV SOLN
INTRAVENOUS | Status: DC
Start: 2016-08-19 — End: 2016-08-20
  Administered 2016-08-19: 23:00:00 via INTRAVENOUS

## 2016-08-19 MED ORDER — IOPAMIDOL (ISOVUE-370) INJECTION 76%
INTRAVENOUS | Status: AC
  Administered 2016-08-19: 50 mL
  Filled 2016-08-19: qty 50

## 2016-08-19 MED ORDER — VITAMIN E 180 MG (400 UNIT) PO CAPS
400.0000 [IU] | ORAL_CAPSULE | Freq: Every day | ORAL | Status: DC
  Administered 2016-08-19 – 2016-08-20 (×2): 400 [IU] via ORAL
  Filled 2016-08-19 (×2): qty 1

## 2016-08-19 NOTE — ED Notes (Signed)
Pt initially A&O to self, disoriented to situation, place, time, family members. Pt now oriented to self, place, situation, family members. Disoriented to time (uncertain of age and month/year). Family at bedside. Pt able to use bedside commode and ambulate with 1-staff assist. Neuro check due at 1800. Negative for stroke on MRI.

## 2016-08-19 NOTE — ED Notes (Signed)
Family updated, awaiting eeg

## 2016-08-19 NOTE — H&P (Signed)
History and Physical  Renee Zuniga [id number] DOB: May 13, 1931 DOA: 08/19/2016  Referring physician: EDP PCP: Kandice Hams, MD   Chief Complaint: aphasia, confusion  HPI: Renee Zuniga is a 82 y.o. female  H/o seizure, HTN, HLD, hypothyroidism sent to [facility] due to above complaints. Per family [place]  And was driving back from [address], husband find patient become confused, with left hand shaking, then patient became aphasia, husband report this happened in December 2016. Family report symptom lasted about 5hours started for around 10 am this morning.  ED course, her vital is stable, initially she is confused and has aphasia, she received iv ativan and keppra loading due to concerns for seizure, code stroke also called, STAT MRI brain no acute findings, patient has brief episode of hypoxia thought due to ativan per EDP Dr Alvino Chapel. EEG no seizure activity. Basic labs unremarkable except mild azotemia bun 31, ekg no acute changes, neurology Dr Kathie Rhodes seen the patient in the ED recommend patient to be kept under observation status under hospitalist service.  Patient started to talk while in the ED but remain confused.  Review of Systems:  Detail per HPI, Review of systems are otherwise negative  Past Medical History:  Diagnosis Date  . Arthritis   . Essential hypertension, benign   . Heart murmur   . High blood pressure   . Hypercholesteremia   . Insomnia   . Memory loss   . Migraine headache   . Mitral valve prolapse   . Osteoarthritis   . Osteopenia   . Tinnitus   . Vitamin D deficiency    Past Surgical History:  Procedure Laterality Date  . CHOLECYSTECTOMY    . CORRECTION HAMMER TOE Bilateral   . GALLBLADDER SURGERY    . OOPHORECTOMY     benign tumor, per patient  . PARTIAL HYSTERECTOMY  1974   due to uterine prolaspe   Social History:  reports that she has never smoked. She has never used  smokeless tobacco. She reports that she drinks about 0.6 oz of alcohol per week . She reports that she does not use drugs. Patient lives at home & is able to participate in activities of daily living independently   Allergies  Allergen Reactions  . Ambien [Zolpidem] Nausea And Vomiting  . Demerol [Meperidine] Nausea And Vomiting       . Lunesta [Eszopiclone] Other (See Comments)    Affected speech    Family History  Problem Relation Age of Onset  . Mitral valve prolapse Father   . Heart Problems Mother   . Breast cancer Mother   . Hypertension Mother       Prior to Admission medications   Medication Sig Start Date End Date Taking? Authorizing Provider  amoxicillin (AMOXIL) 500 MG capsule Take 2,000 mg by mouth See admin instructions. Take 4 capsules (2000 mg) by mouth one hour prior to dental appointment   Yes Historical Provider, MD  chlordiazePOXIDE (LIBRIUM) 10 MG capsule Take 10 mg by mouth daily.    Yes Historical Provider, MD  clobetasol (TEMOVATE) 0.05 % external solution Apply 1 application topically 3 (three) times a week. Apply to scalp after shampooing   Yes Historical Provider, MD  conjugated estrogens (PREMARIN) vaginal cream Place vaginally See admin instructions. Use a blueberry sized amount nightly   Yes Historical Provider, MD  diclofenac (VOLTAREN) 75 MG EC tablet Take 75 mg by mouth 2 (  two) times daily as needed (pain).    Yes Historical Provider, MD  diclofenac sodium (VOLTAREN) 1 % GEL Apply 2 g topically daily. For knee pain   Yes Historical Provider, MD  ezetimibe (ZETIA) 10 MG tablet Take 10 mg by mouth at bedtime.    Yes Historical Provider, MD  ibandronate (BONIVA) 150 MG tablet Take 150 mg by mouth every 30 (thirty) days. Take in the morning with a full glass of water, on an empty stomach, and do not take anything else by mouth or lie down for the next 30 min.   Yes Historical Provider, MD  ketoconazole (NIZORAL) 2 % shampoo Apply 1 application topically 3  (three) times a week. Apply to scalp after shampooing   Yes Historical Provider, MD  levETIRAcetam (KEPPRA) 500 MG tablet Take 1-1/2 tablets twice a day Patient taking differently: Take 750 mg by mouth 2 (two) times daily.  08/12/15  Yes Cameron Sprang, MD  levothyroxine (SYNTHROID, LEVOTHROID) 50 MCG tablet Take 50 mcg by mouth daily before breakfast.   Yes Historical Provider, MD  lisinopril (PRINIVIL,ZESTRIL) 10 MG tablet Take 10 mg by mouth daily.   Yes Historical Provider, MD  Multiple Vitamins-Minerals (MULTIVITAMIN WITH MINERALS) tablet Take 1 tablet by mouth daily.   Yes Historical Provider, MD  Multiple Vitamins-Minerals (OCUVITE PO) Take 1 tablet by mouth daily.   Yes Historical Provider, MD  Omega-3 Fatty Acids (FISH OIL) 1000 MG CAPS Take 1,000 mg by mouth daily.    Yes Historical Provider, MD  pramipexole (MIRAPEX) 0.125 MG tablet Take 0.375 mg by mouth at bedtime.    Yes Historical Provider, MD  traMADol (ULTRAM) 50 MG tablet Take 50 mg by mouth 3 (three) times daily.    Yes Historical Provider, MD  vitamin E 400 UNIT capsule Take 400 Units by mouth daily.   Yes Historical Provider, MD    Physical Exam: BP 145/71   Pulse 88   Temp 97.1 F (36.2 C)   Resp 16   Wt 72.6 kg (160 lb 0.9 oz)   SpO2 98%   BMI 28.81 kg/m   General:  NAD, confused, but follow commands Eyes: PERRL ENT: unremarkable Neck: supple, no JVD Cardiovascular: RRR Respiratory: CTABL Abdomen: soft/ND/ND, positive bowel sounds Skin: no rash Musculoskeletal:  Trace pitting edema bilateral lower extremity Psychiatric: calm/cooperative Neurologic: no focal findings  , confused, only oriented to person, garbled speech          Labs on Admission:  Basic Metabolic Panel:  Recent Labs Lab 08/19/16 1330 08/19/16 1334  NA 138 139  K 4.3 4.4  CL 105 105  CO2 21*  --   GLUCOSE 116* 113*  BUN 31* 31*  CREATININE 1.03* 1.00  CALCIUM 9.4  --    Liver Function Tests:  Recent Labs Lab 08/19/16 1330    AST 23  ALT 28  ALKPHOS 59  BILITOT 0.4  PROT 6.6  ALBUMIN 4.0   No results for input(s): LIPASE, AMYLASE in the last 168 hours. No results for input(s): AMMONIA in the last 168 hours. CBC:  Recent Labs Lab 08/19/16 1330 08/19/16 1334  WBC 6.9  --   NEUTROABS 3.6  --   HGB 12.9 13.3  HCT 39.4 39.0  MCV 94.9  --   PLT 257  --    Cardiac Enzymes: No results for input(s): CKTOTAL, CKMB, CKMBINDEX, TROPONINI in the last 168 hours.  BNP (last 3 results) No results for input(s): BNP in the last 8760 hours.  ProBNP (last 3 results) No results for input(s): PROBNP in the last 8760 hours.  CBG:  Recent Labs Lab 08/19/16 1315 08/19/16 1337  GLUCAP 111* 101*    Radiological Exams on Admission: Mr Brain Wo Contrast  Result Date: 08/19/2016 CLINICAL DATA:  Code stroke. Confusion. The aphasia. Seizure-like activity. EXAM: MRI HEAD WITHOUT CONTRAST TECHNIQUE: Multiplanar, multiecho pulse sequences of the brain and surrounding structures were obtained without intravenous contrast. COMPARISON:  None. FINDINGS: Brain: The diffusion-weighted images demonstrate no evidence for acute or subacute infarction. No acute hemorrhage or mass lesion is present. Moderate periventricular and scattered subcortical T2 changes bilaterally are stable. Remote lacunar infarcts are again seen within the cerebellum. The brainstem is unremarkable. Vascular: Flow is present in the major intracranial arteries. Skull and upper cervical spine: The skullbase is within normal limits. Sinuses/Orbits: The paranasal sinuses the mastoid air cells are clear. The globes orbits are intact. Other: IMPRESSION: 1. No acute intracranial abnormality. 2. Stable moderate atrophy and white matter disease. This likely reflects the sequela of chronic microvascular ischemia. These results were called by telephone at the time of interpretation on 08/19/2016 at 2:12 pm to Dr. Roland Rack , who verbally acknowledged these results.  Electronically Signed   By: San Morelle M.D.   On: 08/19/2016 14:13    EKG: Independently reviewed. Sinus rhythm, no acute st/t changes, QTc wnl  Assessment/Plan Present on Admission: . Aphasia  Aphasia and confusion :  MRI brain on acute findings, likely from seizure activity, keppra loading done in the ED, neurology consulted by EDP. Seizure precaution and aspiration precaution  Hypoxia: from ativan? Will get cxr to r/o aspiration.  Azotemia:  will get ua. Will avoid hydration , due to bilateral lower extremity edema  Bilateral lower extremity edema, will get echocardiogram and venous doppler.  HTN: continue home meds acei  Hypothyroidism: continue synthroid, check tsh  DVT prophylaxis:   Consultants: neurology Dr Leonel Ramsay  Code Status: full   Family Communication:  Patient and multiple family members in room  Disposition Plan:  Med tele observation status  Time spent:  25mins  Donielle Radziewicz MD, PhD [facility] Pager [phone] If 7PM-7AM, please contact night-coverage at [email or web address], password [facility]

## 2016-08-19 NOTE — ED Provider Notes (Addendum)
[place] DEPT Provider Note   CSN: [id number] Arrival date & time: 08/19/16  1311     History   Chief Complaint Chief Complaint  Patient presents with  . Altered Mental Status  . Code Stroke    HPI Renee Zuniga is a 82 y.o. female.  The history is provided by the patient.  Altered Mental Status    Patient was first seen by me the screening exam at 120.Marland Kitchen Patient presents with altered mental status. History of previous seizures. She had been riding in the car with her husband this morning along the [facility]. and began to have difficulty speaking. It then resolved and she was back to her baseline. Around 1:00 today it presented again. She is confusion and difficulty speaking. She's had some similar episodes in the past with her seizures but has not had any seizure activity today. Usually she has some facial droop as the seizure activity. Code stroke was called. Seen by Dr. Leonel Ramsay and myself. Moving all extremities and following some commands but not able to identify her husband. Not able to identify a pen. Stat MRI done to rule out stroke. She is on 750 mg of Keppra twice a day.  Past Medical History:  Diagnosis Date  . Arthritis   . Essential hypertension, benign   . Heart murmur   . High blood pressure   . Hypercholesteremia   . Insomnia   . Memory loss   . Migraine headache   . Mitral valve prolapse   . Osteoarthritis   . Osteopenia   . Tinnitus   . Vitamin D deficiency     Patient Active Problem List   Diagnosis Date Noted  . Localization-related idiopathic epilepsy and epileptic syndromes with seizures of localized onset, not intractable, without status epilepticus ([place]) 08/12/2015  . TIA (transient ischemic attack) 11/09/2014  . HTN (hypertension) 11/09/2014  . Bilateral foot pain 03/31/2014  . Pes planus 03/31/2014  . Subluxation of ankle joint 03/31/2014  . Arthritis   . Heart murmur   . Mitral valve prolapse   . Osteoarthritis   .  Vitamin D deficiency   . Migraine headache   . Hypercholesteremia   . Tinnitus   . Memory loss     Past Surgical History:  Procedure Laterality Date  . CHOLECYSTECTOMY    . CORRECTION HAMMER TOE Bilateral   . GALLBLADDER SURGERY    . OOPHORECTOMY     benign tumor, per patient  . PARTIAL HYSTERECTOMY  1974   due to uterine prolaspe    OB History    No data available       Home Medications    Prior to Admission medications   Medication Sig Start Date End Date Taking? Authorizing Provider  amoxicillin (AMOXIL) 500 MG capsule Take 2,000 mg by mouth See admin instructions. Take 4 capsules (2000 mg) by mouth one hour prior to dental appointment   Yes Historical Provider, MD  chlordiazePOXIDE (LIBRIUM) 10 MG capsule Take 10 mg by mouth daily.    Yes Historical Provider, MD  clobetasol (TEMOVATE) 0.05 % external solution Apply 1 application topically 3 (three) times a week. Apply to scalp after shampooing   Yes Historical Provider, MD  conjugated estrogens (PREMARIN) vaginal cream Place vaginally See admin instructions. Use a blueberry sized amount nightly   Yes Historical Provider, MD  diclofenac (VOLTAREN) 75 MG EC tablet Take 75 mg by mouth 2 (two) times daily as needed (pain).    Yes Historical Provider,  MD  diclofenac sodium (VOLTAREN) 1 % GEL Apply 2 g topically daily. For knee pain   Yes Historical Provider, MD  ezetimibe (ZETIA) 10 MG tablet Take 10 mg by mouth at bedtime.    Yes Historical Provider, MD  ibandronate (BONIVA) 150 MG tablet Take 150 mg by mouth every 30 (thirty) days. Take in the morning with a full glass of water, on an empty stomach, and do not take anything else by mouth or lie down for the next 30 min.   Yes Historical Provider, MD  ketoconazole (NIZORAL) 2 % shampoo Apply 1 application topically 3 (three) times a week. Apply to scalp after shampooing   Yes Historical Provider, MD  levETIRAcetam (KEPPRA) 500 MG tablet Take 1-1/2 tablets twice a day Patient  taking differently: Take 750 mg by mouth 2 (two) times daily.  08/12/15  Yes Cameron Sprang, MD  levothyroxine (SYNTHROID, LEVOTHROID) 50 MCG tablet Take 50 mcg by mouth daily before breakfast.   Yes Historical Provider, MD  lisinopril (PRINIVIL,ZESTRIL) 10 MG tablet Take 10 mg by mouth daily.   Yes Historical Provider, MD  Multiple Vitamins-Minerals (MULTIVITAMIN WITH MINERALS) tablet Take 1 tablet by mouth daily.   Yes Historical Provider, MD  Multiple Vitamins-Minerals (OCUVITE PO) Take 1 tablet by mouth daily.   Yes Historical Provider, MD  Omega-3 Fatty Acids (FISH OIL) 1000 MG CAPS Take 1,000 mg by mouth daily.    Yes Historical Provider, MD  pramipexole (MIRAPEX) 0.125 MG tablet Take 0.375 mg by mouth at bedtime.    Yes Historical Provider, MD  traMADol (ULTRAM) 50 MG tablet Take 50 mg by mouth 3 (three) times daily.    Yes Historical Provider, MD  vitamin E 400 UNIT capsule Take 400 Units by mouth daily.   Yes Historical Provider, MD    Family History Family History  Problem Relation Age of Onset  . Mitral valve prolapse Father   . Heart Problems Mother   . Breast cancer Mother   . Hypertension Mother     Social History Social History  Substance Use Topics  . Smoking status: Never Smoker  . Smokeless tobacco: Never Used  . Alcohol use 0.6 oz/week    1 Glasses of wine per week     Comment: with dinner     Allergies   Ambien [zolpidem]; Demerol [meperidine]; and Lunesta [eszopiclone]   Review of Systems Review of Systems  Unable to perform ROS: Mental status change     Physical Exam Updated Vital Signs BP 145/71   Pulse 88   Temp 97.1 F (36.2 C)   Resp 16   Wt 160 lb 0.9 oz (72.6 kg)   SpO2 98%   BMI 28.81 kg/m   Physical Exam  Constitutional: She appears well-developed.  HENT:  Head: Atraumatic.  Eyes: Pupils are equal, round, and reactive to light.  Neck: Neck supple.  Cardiovascular: Normal rate.   Pulmonary/Chest: No respiratory distress.    Abdominal: Soft.  Musculoskeletal: She exhibits no edema.  Neurological:  Moving all extremities. She is able to speak but not able to answer all questions. Not able to identify her husband. When asked to raise her right hand she did raise her left arm up. Was able to tell the nurse her name but not able to tell me her name. Complete NIH scoring done by neurology.  Skin: Skin is warm. Capillary refill takes less than 2 seconds.     ED Treatments / Results  Labs (all labs ordered are  listed, but only abnormal results are displayed) Labs Reviewed  COMPREHENSIVE METABOLIC PANEL - Abnormal; Notable for the following:       Result Value   CO2 21 (*)    Glucose, Bld 116 (*)    BUN 31 (*)    Creatinine, Ser 1.03 (*)    GFR calc non Af Amer 49 (*)    GFR calc Af Amer 57 (*)    All other components within normal limits  CBG MONITORING, ED - Abnormal; Notable for the following:    Glucose-Capillary 111 (*)    All other components within normal limits  I-STAT CHEM 8, ED - Abnormal; Notable for the following:    BUN 31 (*)    Glucose, Bld 113 (*)    All other components within normal limits  CBG MONITORING, ED - Abnormal; Notable for the following:    Glucose-Capillary 101 (*)    All other components within normal limits  ETHANOL  PROTIME-INR  APTT  CBC  DIFFERENTIAL  URINE RAPID DRUG SCREEN, HOSP PERFORMED  URINALYSIS, ROUTINE W REFLEX MICROSCOPIC (NOT AT [facility])  Randolm Idol, ED    EKG  EKG Interpretation None       Radiology Mr Brain Wo Contrast  Result Date: 08/19/2016 CLINICAL DATA:  Code stroke. Confusion. The aphasia. Seizure-like activity. EXAM: MRI HEAD WITHOUT CONTRAST TECHNIQUE: Multiplanar, multiecho pulse sequences of the brain and surrounding structures were obtained without intravenous contrast. COMPARISON:  None. FINDINGS: Brain: The diffusion-weighted images demonstrate no evidence for acute or subacute infarction. No acute hemorrhage or mass lesion is  present. Moderate periventricular and scattered subcortical T2 changes bilaterally are stable. Remote lacunar infarcts are again seen within the cerebellum. The brainstem is unremarkable. Vascular: Flow is present in the major intracranial arteries. Skull and upper cervical spine: The skullbase is within normal limits. Sinuses/Orbits: The paranasal sinuses the mastoid air cells are clear. The globes orbits are intact. Other: IMPRESSION: 1. No acute intracranial abnormality. 2. Stable moderate atrophy and white matter disease. This likely reflects the sequela of chronic microvascular ischemia. These results were called by telephone at the time of interpretation on 08/19/2016 at 2:12 pm to Dr. Roland Rack , who verbally acknowledged these results. Electronically Signed   By: San Morelle M.D.   On: 08/19/2016 14:13    Procedures Procedures (including critical care time)  Medications Ordered in ED Medications  LORazepam (ATIVAN) injection 1 mg (1 mg Intravenous Given 08/19/16 1343)  levETIRAcetam (KEPPRA) 1,000 mg in sodium chloride 0.9 % 100 mL IVPB (0 mg Intravenous Stopped 08/19/16 1500)     Initial Impression / Assessment and Plan / ED Course  I have reviewed the triage vital signs and the nursing notes.  Pertinent labs & imaging results that were available during my care of the patient were reviewed by me and considered in my medical decision making (see chart for details).  Clinical Course    Patient with aphasia. Acute onset with worrisome for stroke. Has had previous seizures with similar deficits also. Stat MRI done that did not show ischemic changes. Stat EEG will be done to. Patient was given 2 boluses of IV Ativan and a 1 g loss of Keppra. Sleeping at this time. dispo depending on EEG.   CRITICAL CARE Performed by: Mackie Pai Total critical care time: 30 minutes Critical care time was exclusive of separately billable procedures and treating other  patients. Critical care was necessary to treat or prevent imminent or life-threatening deterioration. Critical care was time  spent personally by me on the following activities: development of treatment plan with patient and/or surrogate as well as nursing, discussions with consultants, evaluation of patient's response to treatment, examination of patient, obtaining history from patient or surrogate, ordering and performing treatments and interventions, ordering and review of laboratory studies, ordering and review of radiographic studies, pulse oximetry and re-evaluation of patient's condition. Final Clinical Impressions(s) / ED Diagnoses   Final diagnoses:  Aphasia    New Prescriptions New Prescriptions   No medications on file     Davonna Belling, MD 08/19/16 1533   eeg Does not show seizure activity. Mental status improved somewhat. Now saying more words. Discussed with Dr. Leonel Ramsay and will admit to internal medicine. Keppra will be increased. Admit to hospitalist.   Davonna Belling, MD 08/19/16 [phone]

## 2016-08-19 NOTE — Progress Notes (Signed)
STAT EEG completed; results pending. 

## 2016-08-19 NOTE — ED Triage Notes (Signed)
Husband stated, she was normal and then she got confused and was unable to tell me she had pancakes.She was unable to even figure out the remote control.  This happened again in December 2016. Pt. Was able to tell me her name only. Was not able to tell me the date , time, or president.  Husband stated, she had some hand shaking on the left.

## 2016-08-19 NOTE — Consult Note (Signed)
Neurology Consultation Reason for Consult: Aphasia Referring Physician: Alvino Chapel  CC: Aphasia  History is obtained from: Patient  HPI: Renee Zuniga is a 82 y.o. female with a history of recurrent stereotyped spells of aphasia. She has been started on Keppra for concern that these might be partial complex seizures. She was in her normal state of health this morning, but had some difficulty with word finding in the car earlier. She then improved, her husband left on this daughter with her car and when he returned home he found her to be confused. She is brought to the ER where she was found to be aphasic and therefore a code stroke was activated. An emergent MRI was performed in lieu of CT given her history and the availability of MRI fairly quickly. This was negative for acute stroke. She was given Ativan as well as Keppra and had some improvement, but remains slightly confused. A stat EEG was performed to rule out partial status epilepticus with no ongoing seizure.   LKW: 10:30 AM tpa given?: no, not a stroke    ROS: A 14 point ROS was performed and is negative except as noted in the HPI.   Past Medical History:  Diagnosis Date  . Arthritis   . Essential hypertension, benign   . Heart murmur   . High blood pressure   . Hypercholesteremia   . Insomnia   . Memory loss   . Migraine headache   . Mitral valve prolapse   . Osteoarthritis   . Osteopenia   . Tinnitus   . Vitamin D deficiency      Family History  Problem Relation Age of Onset  . Mitral valve prolapse Father   . Heart Problems Mother   . Breast cancer Mother   . Hypertension Mother      Social History:  reports that she has never smoked. She has never used smokeless tobacco. She reports that she drinks about 0.6 oz of alcohol per week . She reports that she does not use drugs.   Exam: Current vital signs: BP 141/71   Pulse 75   Temp 97.1 F (36.2 C)   Resp 18   Wt 72.6 kg (160 lb 0.9 oz)   SpO2 97%    BMI 28.81 kg/m  Vital signs in last 24 hours: Temp:  [97.1 F (36.2 C)] 97.1 F (36.2 C) (09/09 1729) Pulse Rate:  [20-88] 75 (09/09 1715) Resp:  [14-21] 18 (09/09 1715) BP: (141-171)/(71-93) 141/71 (09/09 1715) SpO2:  [97 %-100 %] 97 % (09/09 1715) Weight:  [72.6 kg (160 lb 0.9 oz)] 72.6 kg (160 lb 0.9 oz) (09/09 1344)   Physical Exam  Constitutional: Appears well-developed and well-nourished.  Psych: Affect appropriate to situation Eyes: No scleral injection HENT: No OP obstrucion Head: Normocephalic.  Cardiovascular: Normal rate and regular rhythm.  Respiratory: Effort normal and breath sounds normal to anterior ascultation GI: Soft.  No distension. There is no tenderness.  Skin: WDI  Neuro: Mental Status: Patient is awake, alert, Initially, she was able to tell me her name but not reliably follow commands or answer questions. She was unable to name her husband or her daughter. She subsequently improved and had increased fluency of speech and was able to answer questions. Cranial Nerves: II: Blinks to threat bilaterally Pupils are equal, round, and reactive to light.   III,IV, VI: EOMI without ptosis or diploplia.  V: Facial sensation is symmetric to temperature VII: Facial movement with mild right facial droop  VIII: hearing is intact to voice X: Uvula elevates symmetrically XI: Shoulder shrug is symmetric. XII: tongue is midline without atrophy or fasciculations.  Motor: Tone is normal. Bulk is normal. 5/5 strength was present in all four extremities.  Sensory: She responds equally to noxious stimulation on bilateral sides Cerebellar: No clear ataxia   I have reviewed labs in epic and the results pertinent to this consultation are: Creatinine 1.03   I have reviewed the images obtained:MRI brain-no signs of acute infarct   Impression: 82 year old female with recurrent stereotyped episodes of aphasia with apparent right facial droop during her episode today. She  is already improving by the time the EEG was connected, therefore I do not think that lack of epileptiform findings on EEG rules out partial seizures. Given her continued confusion, however, I do think that admission with observation would be prudent.  She has had vascular imaging with MRA and carotid Dopplers, but I think a CT angiogram would give a better picture and we will pursue that.   Recommendations: 1) CT angiogram head and neck 2) increase Keppra to 1 g twice a day 3) neurology will continue to follow  Roland Rack, MD [facility] [phone]  If 7pm- 7am, please page neurology on call as listed in [place].

## 2016-08-19 NOTE — ED Provider Notes (Signed)
MSE was initiated and I personally evaluated the patient and placed orders (if any) at  1:24 PM on August 19, 2016.  The patient appears stable so that the remainder of the MSE may be completed by another provider.  Met patient in triage. Had difficulty speaking and some difficulty understanding words. Last normal around an hour ago. Had had similar episode earlier today but have resolved back to normal. Code stroke called. Seen at around 1 PM by me.   Davonna Belling, MD 08/19/16 1324

## 2016-08-19 NOTE — ED Notes (Signed)
Pt resting and O2 sat dropped to 88% on RA. Applied 2L nasal canula. Oxygen saturation up to 94%. Will continue to monitor.

## 2016-08-19 NOTE — ED Notes (Signed)
Code Stroke called @ [id number]

## 2016-08-20 ENCOUNTER — Ambulatory Visit (HOSPITAL_COMMUNITY): Payer: Medicare Other

## 2016-08-20 DIAGNOSIS — I10 Essential (primary) hypertension: Secondary | ICD-10-CM | POA: Diagnosis not present

## 2016-08-20 DIAGNOSIS — R4701 Aphasia: Secondary | ICD-10-CM | POA: Diagnosis not present

## 2016-08-20 DIAGNOSIS — E039 Hypothyroidism, unspecified: Secondary | ICD-10-CM | POA: Diagnosis not present

## 2016-08-20 DIAGNOSIS — R0902 Hypoxemia: Secondary | ICD-10-CM | POA: Diagnosis not present

## 2016-08-20 DIAGNOSIS — G40901 Epilepsy, unspecified, not intractable, with status epilepticus: Secondary | ICD-10-CM

## 2016-08-20 DIAGNOSIS — E7800 Pure hypercholesterolemia, unspecified: Secondary | ICD-10-CM | POA: Diagnosis not present

## 2016-08-20 LAB — CBC
HCT: 38.9 % (ref 36.0–46.0)
Hemoglobin: 12.3 g/dL (ref 12.0–15.0)
MCH: 30.4 pg (ref 26.0–34.0)
MCHC: 31.6 g/dL (ref 30.0–36.0)
MCV: 96 fL (ref 78.0–100.0)
Platelets: 255 10*3/uL (ref 150–400)
RBC: 4.05 MIL/uL (ref 3.87–5.11)
RDW: 13.6 % (ref 11.5–15.5)
WBC: 6.4 10*3/uL (ref 4.0–10.5)

## 2016-08-20 LAB — BASIC METABOLIC PANEL
Anion gap: 6 (ref 5–15)
BUN: 15 mg/dL (ref 6–20)
CO2: 25 mmol/L (ref 22–32)
Calcium: 9 mg/dL (ref 8.9–10.3)
Chloride: 106 mmol/L (ref 101–111)
Creatinine, Ser: 0.82 mg/dL (ref 0.44–1.00)
GFR calc Af Amer: >60 mL/min (ref >60)
GFR calc non Af Amer: >60 mL/min (ref >60)
Glucose, Bld: 124 mg/dL — ABNORMAL HIGH (ref 65–99)
Potassium: 4.1 mmol/L (ref 3.5–5.1)
Sodium: 137 mmol/L (ref 135–145)

## 2016-08-20 LAB — URINE CULTURE

## 2016-08-20 LAB — TSH: TSH: 3.862 u[IU]/mL (ref 0.350–4.500)

## 2016-08-20 MED ORDER — LEVETIRACETAM 1000 MG PO TABS: 1000.0000 mg | ORAL_TABLET | Freq: Two times a day (BID) | ORAL | 0 refills | Status: DC

## 2016-08-20 MED ORDER — LEVETIRACETAM 500 MG PO TABS
1000.0000 mg | ORAL_TABLET | Freq: Two times a day (BID) | ORAL | Status: DC
Start: 2016-08-20 — End: 2016-08-20
  Administered 2016-08-20: 1000 mg via ORAL
  Filled 2016-08-20: qty 2

## 2016-08-20 NOTE — Progress Notes (Addendum)
Subjective: Back to baseline per family.   Exam: Vitals:   08/20/16 0400 08/20/16 0600  BP: 137/63 (!) 146/71  Pulse: 76 88  Resp: 16 16  Temp: 98.7 F (37.1 C) 98.7 F (37.1 C)   Gen: In bed, NAD Resp: non-labored breathing, no acute distress Abd: soft, nt  Neuro: MS: awake, alert, interactive and appropriate.  [id number], EOMI Motor: MAEW Sensory:intact to LT  Pertinent Labs: Cr 0.8    Impression: 82 yo F with episodic aphasia. 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 Given the duration of the episode, I strongly suspect that she presented in status epilepticus that broke with ativan/keppra. She had some focal delta on EEG as can be seen post-ictally, no signs of stroke. No evidence of focal narrowing on CTA to suggest a cause for sterotyped TIA.   Recommendations: 1) Increase keppra to 1gm BID 2) d/c tramadol 3) f/u with Dr. Delice Lesch.   Roland Rack, MD [facility] [phone]  If 7pm- 7am, please page neurology on call as listed in [place].

## 2016-08-20 NOTE — Discharge Summary (Signed)
Physician Discharge Summary  Renee Zuniga [id number] DOB: 1931-06-08 DOA: 08/19/2016  PCP: Kandice Hams, MD  Admit date: 08/19/2016 Discharge date: 08/20/2016  Admitted From: Home Disposition: Home   Recommendations for Outpatient Follow-up:  1. Follow up with PCP in 1-2 weeks 2. Consider outpatient evaluation of LE edema, thought to be venous insufficiency.  3. Follow up with neurology, Dr. Delice Lesch: Keppra was increased to 1g BID for presumed seizure.  4. Discontinued tramadol permanently given Dx of seizure disorder.   Home Health: None Equipment/Devices: None  Discharge Condition: Stable CODE STATUS: Full Diet recommendation: No restrictions.   Brief/Interim Summary: Renee Zuniga is a 82 y.o. female with a history of recurrent stereotyped spells of aphasia. She was started on Keppra by her neurologist for concern that these might be partial complex seizures. She presented to the ED 9/9 due to trouble finding words which worsened and became associated with confusion. On arrival, she was found to be aphasic and therefore a code stroke was activated. An emergent MRI was performed in lieu of CT given her history and the availability of MRI fairly quickly. This was negative for acute stroke. UDS, EtOH, and troponin was negative. TSH 3.862. UA unremarkable. She was given Ativan as well as Keppra with slow improvement back to baseline over the next 24 hours. A stat EEG was performed to rule out partial status epilepticus with no ongoing seizure. This showed focal delta activity, thought to be consistent with post-ictal state, following presumptive partial complex seizures. Keppra was increased from 750mg  BID to 1,000mg  BID and tramadol was discontinued.  Pt has signs/symptoms of venous insufficiency. Doppler of bilateral LE's and echo cardiogram were considered to rule out alternative causes of lower extremity edema, though this resolved the day after admission and testing was not  pursued as an inpatient. Consider work up as indicated as an outpatient.   Discharge Diagnoses:  Active Problems:   Aphasia   Seizure [facility])   Status epilepticus [facility])  Discharge Instructions Discharge Instructions    Diet - low sodium heart healthy    Complete by:  As directed   Discharge instructions    Complete by:  As directed   You were evaluated for difficulty speaking which has improved. It is suspected the cause of your symptoms was a seizure. To prevent further seizures:  - CHANGE KEPPRA to take 1000mg  twice a day (no longer 750mg  twice a day) - STOP TRAMADOL. This lowers the threshold for seizures.  - FOLLOW UP with Dr. Delice Lesch in 1 - 2 months - NO driving for 6 months, per [place] law.   You had some leg swelling that is likely due to venous insufficiency, a common and benign impairment of the veins in your legs to return blood from your legs to your heart. You should follow up with your primary care physician to discuss if any further testing is required for this.  - FOLLOW UP with Dr. Delfina Redwood.  - FOLLOW UP with your cardiologist.  It was a pleasure getting to know you and your family. Take care! - Dr. Bonner Puna   Increase activity slowly    Complete by:  As directed       Medication List    STOP taking these medications   traMADol 50 MG tablet Commonly known as:  ULTRAM     TAKE these medications   amoxicillin 500 MG capsule Commonly known as:  AMOXIL Take 2,000 mg by mouth See admin instructions. Take 4  capsules (2000 mg) by mouth one hour prior to dental appointment   BONIVA 150 MG tablet Generic drug:  ibandronate Take 150 mg by mouth every 30 (thirty) days. Take in the morning with a full glass of water, on an empty stomach, and do not take anything else by mouth or lie down for the next 30 min.   chlordiazePOXIDE 10 MG capsule Commonly known as:  LIBRIUM Take 10 mg by mouth daily.   clobetasol 0.05 % external solution Commonly known as:  TEMOVATE Apply 1  application topically 3 (three) times a week. Apply to scalp after shampooing   conjugated estrogens vaginal cream Commonly known as:  PREMARIN Place vaginally See admin instructions. Use a blueberry sized amount nightly   diclofenac 75 MG EC tablet Commonly known as:  VOLTAREN Take 75 mg by mouth 2 (two) times daily as needed (pain).   diclofenac sodium 1 % Gel Commonly known as:  VOLTAREN Apply 2 g topically daily. For knee pain   ezetimibe 10 MG tablet Commonly known as:  ZETIA Take 10 mg by mouth at bedtime.   Fish Oil 1000 MG Caps Take 1,000 mg by mouth daily.   ketoconazole 2 % shampoo Commonly known as:  NIZORAL Apply 1 application topically 3 (three) times a week. Apply to scalp after shampooing   levETIRAcetam 1000 MG tablet Commonly known as:  KEPPRA Take 1 tablet (1,000 mg total) by mouth 2 (two) times daily. What changed:  medication strength  how much to take  how to take this  when to take this  additional instructions   levothyroxine 50 MCG tablet Commonly known as:  SYNTHROID, LEVOTHROID Take 50 mcg by mouth daily before breakfast.   lisinopril 10 MG tablet Commonly known as:  PRINIVIL,ZESTRIL Take 10 mg by mouth daily.   OCUVITE PO Take 1 tablet by mouth daily.   multivitamin with minerals tablet Take 1 tablet by mouth daily.   pramipexole 0.125 MG tablet Commonly known as:  MIRAPEX Take 0.375 mg by mouth at bedtime.   vitamin E 400 UNIT capsule Take 400 Units by mouth daily.      Follow-up Information    POLITE,RONALD D, MD. Schedule an appointment as soon as possible for a visit in 2 week(s).   Specialty:  Internal Medicine Contact information: [address] [place] [address] [phone]        Cameron Sprang, MD. Schedule an appointment as soon as possible for a visit in 1 month(s).   Specialty:  Neurology Contact information: [place] [address] [phone]         Cardiology. Schedule an appointment as soon as possible for a visit today.          Allergies  Allergen Reactions  . Ambien [Zolpidem] Nausea And Vomiting  . Demerol [Meperidine] Nausea And Vomiting       . Lunesta [Eszopiclone] Other (See Comments)    Affected speech    Consultations:  Dr. Leonel Ramsay, neurology  Procedures/Studies: Ct Angio Head W Or Wo Contrast  Result Date: 08/20/2016 CLINICAL DATA:  Initial evaluation for acute aphasia. EXAM: CT ANGIOGRAPHY HEAD AND NECK TECHNIQUE: Multidetector CT imaging of the head and neck was performed using the standard protocol during bolus administration of intravenous contrast. Multiplanar CT image reconstructions and MIPs were obtained to evaluate the vascular anatomy. Carotid stenosis measurements (when applicable) are obtained utilizing NASCET criteria, using the distal internal carotid diameter as the denominator. CONTRAST:  50 cc of Isovue 370. COMPARISON:  Prior MRI/17. FINDINGS: CT HEAD Scalp soft tissues within normal limits. Globes and orbits within normal limits. Visualized paranasal sinuses are clear.  No mastoid effusion. Calvarium intact. Generalized cerebral atrophy with chronic microvascular ischemic disease. Small remote left cerebellar infarct. No acute intracranial hemorrhage. No evidence for acute large vessel territory infarct. No mass lesion, midline shift or mass effect. No hydrocephalus. No extra-axial fluid collection. CTA NECK Aortic arch: Visualized aortic arch of normal caliber with normal branch pattern. Scattered calcified noncalcified plaque within the arch itself and about the origin of the great vessels without flow-limiting stenosis. Visualized subclavian arteries widely patent. Right carotid system: Right common carotid artery patent from its origin to the bifurcation. No significant atheromatous plaque about the right bifurcation. Right ICA widely patent from the bifurcation to the skullbase without stenosis,  dissection, or occlusion. Right external carotid artery is branches within normal limits. Left carotid system: Left common carotid artery patent from its origin to the bifurcation. Mild calcified atheromatous plaque about the left bifurcation without significant stenosis. Left ICA widely patent from the bifurcation to the skullbase without stenosis, dissection, or occlusion. Left external carotid artery is branches within normal limits. Vertebral arteries:Both vertebral arteries arise from the subclavian arteries. Vertebral arteries widely patent within the neck without stenosis, dissection, or occlusion. Skeleton: No acute osseous abnormality. Grade 1 anterolisthesis of C7 on T1. Moderate multilevel degenerate spondylolysis on the cervical spine. No worrisome lytic or blastic osseous lesions. Other neck: Mild atelectatic changes within the partially visualized left lung. Visualized lungs are otherwise clear. Visualize mediastinum within normal limits. Parenchymal calcification noted within the left thyroid lobe. Thyroid otherwise unremarkable. No adenopathy within the neck. No acute soft tissue abnormality. CTA HEAD Anterior circulation: Petrous, cavernous, and supraclinoid segments of the internal carotid arteries are patent without flow limiting stenosis. Mild fraying scattered plaque within the cavernous ICAs without stenosis. A1 segments patent. Right A1 segment hypoplastic. Anterior communicating artery normal. Anterior cerebral arteries well opacified to their distal aspects. M1 segments widely patent without stenosis or occlusion. No proximal M2 stenosis or occlusion. Distal MCA branches well perfused bilaterally. Posterior circulation: Vertebral arteries widely patent to the vertebrobasilar junction. Mild undulation of the V4 segments bilaterally favored to be motion related on this exam. Visualized posterior inferior cerebral arteries patent. Basilar artery widely patent. Superior cerebral arteries well  opacified bilaterally. Both of the posterior cerebral arteries resting the basilar artery no well opacified to their distal aspects. Venous sinuses: Pain. Anatomic variants: No significant anatomic variant. No aneurysm or vascular malformation. Minimal focal outpouching arising from the supra clinoid left ICA at the expected takeoff of the left posterior communicating artery favored to reflect a small infundibulum. Delayed phase: No pathologic enhancement. IMPRESSION: 1. Essentially normal CTA of the head and neck for patient age. No large vessel occlusion. No high-grade or correctable stenosis. 2. Mild atheromatous plaque about the left bifurcation without flow-limiting stenosis. 3. Mild for age atheromatous plaque within the cavernous ICAs without stenosis. Electronically Signed   By: Jeannine Boga M.D.   On: 08/20/2016 01:22   Ct Angio Neck W Or Wo Contrast  Result Date: 08/20/2016 CLINICAL DATA:  Initial evaluation for acute aphasia. EXAM: CT ANGIOGRAPHY HEAD AND NECK TECHNIQUE: Multidetector CT imaging of the head and neck was performed using the standard protocol during bolus administration of intravenous contrast. Multiplanar CT image reconstructions and MIPs were obtained to evaluate the vascular anatomy. Carotid stenosis measurements (when applicable) are obtained utilizing  NASCET criteria, using the distal internal carotid diameter as the denominator. CONTRAST:  50 cc of Isovue 370. COMPARISON:  Prior MRI/17. FINDINGS: CT HEAD Scalp soft tissues within normal limits. Globes and orbits within normal limits. Visualized paranasal sinuses are clear.  No mastoid effusion. Calvarium intact. Generalized cerebral atrophy with chronic microvascular ischemic disease. Small remote left cerebellar infarct. No acute intracranial hemorrhage. No evidence for acute large vessel territory infarct. No mass lesion, midline shift or mass effect. No hydrocephalus. No extra-axial fluid collection. CTA NECK Aortic  arch: Visualized aortic arch of normal caliber with normal branch pattern. Scattered calcified noncalcified plaque within the arch itself and about the origin of the great vessels without flow-limiting stenosis. Visualized subclavian arteries widely patent. Right carotid system: Right common carotid artery patent from its origin to the bifurcation. No significant atheromatous plaque about the right bifurcation. Right ICA widely patent from the bifurcation to the skullbase without stenosis, dissection, or occlusion. Right external carotid artery is branches within normal limits. Left carotid system: Left common carotid artery patent from its origin to the bifurcation. Mild calcified atheromatous plaque about the left bifurcation without significant stenosis. Left ICA widely patent from the bifurcation to the skullbase without stenosis, dissection, or occlusion. Left external carotid artery is branches within normal limits. Vertebral arteries:Both vertebral arteries arise from the subclavian arteries. Vertebral arteries widely patent within the neck without stenosis, dissection, or occlusion. Skeleton: No acute osseous abnormality. Grade 1 anterolisthesis of C7 on T1. Moderate multilevel degenerate spondylolysis on the cervical spine. No worrisome lytic or blastic osseous lesions. Other neck: Mild atelectatic changes within the partially visualized left lung. Visualized lungs are otherwise clear. Visualize mediastinum within normal limits. Parenchymal calcification noted within the left thyroid lobe. Thyroid otherwise unremarkable. No adenopathy within the neck. No acute soft tissue abnormality. CTA HEAD Anterior circulation: Petrous, cavernous, and supraclinoid segments of the internal carotid arteries are patent without flow limiting stenosis. Mild fraying scattered plaque within the cavernous ICAs without stenosis. A1 segments patent. Right A1 segment hypoplastic. Anterior communicating artery normal. Anterior  cerebral arteries well opacified to their distal aspects. M1 segments widely patent without stenosis or occlusion. No proximal M2 stenosis or occlusion. Distal MCA branches well perfused bilaterally. Posterior circulation: Vertebral arteries widely patent to the vertebrobasilar junction. Mild undulation of the V4 segments bilaterally favored to be motion related on this exam. Visualized posterior inferior cerebral arteries patent. Basilar artery widely patent. Superior cerebral arteries well opacified bilaterally. Both of the posterior cerebral arteries resting the basilar artery no well opacified to their distal aspects. Venous sinuses: Pain. Anatomic variants: No significant anatomic variant. No aneurysm or vascular malformation. Minimal focal outpouching arising from the supra clinoid left ICA at the expected takeoff of the left posterior communicating artery favored to reflect a small infundibulum. Delayed phase: No pathologic enhancement. IMPRESSION: 1. Essentially normal CTA of the head and neck for patient age. No large vessel occlusion. No high-grade or correctable stenosis. 2. Mild atheromatous plaque about the left bifurcation without flow-limiting stenosis. 3. Mild for age atheromatous plaque within the cavernous ICAs without stenosis. Electronically Signed   By: Jeannine Boga M.D.   On: 08/20/2016 01:22   Mr Brain Wo Contrast  Result Date: 08/19/2016 CLINICAL DATA:  Code stroke. Confusion. The aphasia. Seizure-like activity. EXAM: MRI HEAD WITHOUT CONTRAST TECHNIQUE: Multiplanar, multiecho pulse sequences of the brain and surrounding structures were obtained without intravenous contrast. COMPARISON:  None. FINDINGS: Brain: The diffusion-weighted images demonstrate no evidence for acute or subacute  infarction. No acute hemorrhage or mass lesion is present. Moderate periventricular and scattered subcortical T2 changes bilaterally are stable. Remote lacunar infarcts are again seen within the  cerebellum. The brainstem is unremarkable. Vascular: Flow is present in the major intracranial arteries. Skull and upper cervical spine: The skullbase is within normal limits. Sinuses/Orbits: The paranasal sinuses the mastoid air cells are clear. The globes orbits are intact. Other: IMPRESSION: 1. No acute intracranial abnormality. 2. Stable moderate atrophy and white matter disease. This likely reflects the sequela of chronic microvascular ischemia. These results were called by telephone at the time of interpretation on 08/19/2016 at 2:12 pm to Dr. Roland Rack , who verbally acknowledged these results. Electronically Signed   By: San Morelle M.D.   On: 08/19/2016 14:13   Dg Chest Portable 1 View  Result Date: 08/19/2016 CLINICAL DATA:  Altered mental status EXAM: PORTABLE CHEST 1 VIEW COMPARISON:  None. FINDINGS: Cardiomediastinal silhouette is unremarkable. No acute infiltrate or pleural effusion. No pulmonary edema. Degenerative changes bilateral acromioclavicular joints. IMPRESSION: No active disease. Electronically Signed   By: Lahoma Crocker M.D.   On: 08/19/2016 16:45    EEG 08/19/2016:  EEG Abnormalities: 1) Left frontotemporal delta activity  Clinical Interpretation: This EEG is consistent with left frontotemporal dysfunction. This is a nonspecific finding that can be seen in [place] states such as structural injury(e.g. Stroke), tumors, migraine, or post-ictal state.   There was no seizure or definite seizure predisposition recorded on this study. Please note that a normal EEG does not preclude the possibility of epilepsy.   ECHO PERFORMED December 2015: EF 55-60%, G1DD, no RWMA, mild/mod MR.   Subjective: Pt back to mental baseline with normal speech per family (husband, 2 daughters in the room). She has no complaints, denies focal neuro deficits, aphasia, confusion, dyspnea, orthopnea, and leg swelling. No personal or FH of clots.   Discharge Exam: Vitals:   08/20/16 0600  08/20/16 0934  BP: (!) 146/71 (!) 161/106  Pulse: 88 80  Resp: 16 18  Temp: 98.7 F (37.1 C) 98.3 F (36.8 C)   Vitals:   08/20/16 0200 08/20/16 0400 08/20/16 0600 08/20/16 0934  BP: 111/60 137/63 (!) 146/71 (!) 161/106  Pulse: 82 76 88 80  Resp: 16 16 16 18   Temp: 98.7 F (37.1 C) 98.7 F (37.1 C) 98.7 F (37.1 C) 98.3 F (36.8 C)  TempSrc: Oral Oral Oral Oral  SpO2: 98% 94% 98% 96%  Weight:       General: Pt is alert, awake, not in acute distress Cardiovascular: RRR, S1/S2 +, II/VI SEM, no rubs, no gallops Respiratory: CTA bilaterally, no wheezing, no rhonchi Abdominal: Soft, NT, ND, bowel sounds + Extremities: No lower extremity edema on exam, negative Homan's. + varicosities, no cyanosis  The results of significant diagnostics from this hospitalization (including imaging, microbiology, ancillary and laboratory) are listed below for reference.    Microbiology: None  Labs: Basic Metabolic Panel:  Recent Labs Lab 08/19/16 1330 08/19/16 1334 08/19/16 2119 08/20/16 0750  NA 138 139  --  137  K 4.3 4.4  --  4.1  CL 105 105  --  106  CO2 21*  --   --  25  GLUCOSE 116* 113*  --  124*  BUN 31* 31*  --  15  CREATININE 1.03* 1.00 0.84 0.82  CALCIUM 9.4  --   --  9.0   Liver Function Tests:  Recent Labs Lab 08/19/16 1330  AST 23  ALT 28  ALKPHOS  31  BILITOT 0.4  PROT 6.6  ALBUMIN 4.0   CBC:  Recent Labs Lab 08/19/16 1330 08/19/16 1334 08/19/16 2119 08/20/16 0750  WBC 6.9  --  6.8 6.4  NEUTROABS 3.6  --   --   --   HGB 12.9 13.3 11.7* 12.3  HCT 39.4 39.0 36.9 38.9  MCV 94.9  --  95.6 96.0  PLT 257  --  246 255   CBG:  Recent Labs Lab 08/19/16 1315 08/19/16 1337  GLUCAP 111* 101*   Thyroid function studies  Recent Labs  08/20/16 0750  TSH 3.862   Urinalysis    Component Value Date/Time   COLORURINE YELLOW 08/19/2016 [place] 08/19/2016 1635   LABSPEC 1.016 08/19/2016 1635   PHURINE 5.5 08/19/2016 1635    GLUCOSEU NEGATIVE 08/19/2016 1635   HGBUR NEGATIVE 08/19/2016 1635   BILIRUBINUR NEGATIVE 08/19/2016 1635   BILIRUBINUR neg 10/06/2012 0825   KETONESUR NEGATIVE 08/19/2016 1635   PROTEINUR NEGATIVE 08/19/2016 1635   UROBILINOGEN 0.2 05/06/2015 1825   NITRITE NEGATIVE 08/19/2016 1635   LEUKOCYTESUR TRACE (A) 08/19/2016 1635   Time coordinating discharge: Over 30 minutes  Vance Gather, MD  [facility] 08/20/2016, 11:14 AM Pager [phone]  If 7PM-7AM, please contact night-coverage [email or web address] Password [email or web address]

## 2016-08-20 NOTE — Discharge Instructions (Signed)

## 2016-08-20 NOTE — Progress Notes (Signed)
Pt d/c to home by car with family. Assessment stable. All questions answered. 

## 2016-08-20 NOTE — Procedures (Signed)
  History: 82 yo F with transient episodes of aphasia.   Sedation: ativan prior to recording  Technique: This is a 21 channel routine scalp EEG performed at the bedside with bipolar and monopolar montages arranged in accordance to the international 10/20 system of electrode placement. One channel was dedicated to EKG recording.    Background: There is a posterior dominant rhythm of 8 Hz which is seen better on the right than left. There is irregular delta activity that is maximal over the left frontotemporal regions. No sleep was recorded.   Photic stimulation: Physiologic driving is not performed  EEG Abnormalities: 1) Left frontotemporal delta activity   Clinical Interpretation: This EEG is consistent with left frontotemporal dysfunction. This is a nonspecific finding that can be seen in [place] states such as structural injury(e.g. Stroke), tumors, migraine, or post-ictal state.   There was no seizure or definite seizure predisposition recorded on this study. Please note that a normal EEG does not preclude the possibility of epilepsy.   Roland Rack, MD [facility] [phone]  If 7pm- 7am, please page neurology on call as listed in [place].

## 2016-08-21 DIAGNOSIS — Z23 Encounter for immunization: Secondary | ICD-10-CM | POA: Diagnosis not present

## 2016-08-21 DIAGNOSIS — G40909 Epilepsy, unspecified, not intractable, without status epilepticus: Secondary | ICD-10-CM | POA: Diagnosis not present

## 2016-08-21 DIAGNOSIS — Q248 Other specified congenital malformations of heart: Secondary | ICD-10-CM | POA: Diagnosis not present

## 2016-08-21 DIAGNOSIS — R600 Localized edema: Secondary | ICD-10-CM | POA: Diagnosis not present

## 2016-08-23 ENCOUNTER — Telehealth: Payer: Self-pay | Admitting: Neurology

## 2016-08-23 NOTE — Telephone Encounter (Signed)
Renee Zuniga [birth year].  Her daughter called regarding some medication. She was on Tramadol in April and then she has been on Kepplar. Kepplar was increased and she feels maybe it should not have been. Her daughter's  # [id number]. What would be a valid reason for bringing her to the hospital? She had a brief moment of confusion. [place] Mondo She is her daughter and  [place]. Thank you

## 2016-08-24 NOTE — Telephone Encounter (Signed)
Pls let her know Dr. Leonel Ramsay from the hospital had contacted me about her mother, agree with increasing Keppra to 1000mg  BID. If confusion episodes last longer than 30 minutes, go to ER. She may be a little drowsy or irritable on the higher dose, this usually improves after a couple of weeks. F/u with me in the next 3-4 weeks. Thanks

## 2016-08-24 NOTE — Telephone Encounter (Signed)
Advised pt's daughter.

## 2016-08-24 NOTE — Telephone Encounter (Signed)
Spoke with pt's daughter Nardia Gajewski. She states pt was in hospital over the weekend. They increased her Keppra to 1000mg  BID. Pt yesterday had confusion and not finishing her sentences. Pt's daughter is wanting advice on if medication should have been increased and what to watch for or how long to let her have confused spells before taking her back to Er.

## 2016-08-29 ENCOUNTER — Ambulatory Visit (INDEPENDENT_AMBULATORY_CARE_PROVIDER_SITE_OTHER): Payer: Medicare Other | Admitting: Neurology

## 2016-08-29 ENCOUNTER — Encounter: Payer: Self-pay | Admitting: Neurology

## 2016-08-29 VITALS — BP 126/78 | HR 100 | Temp 98.0°F | Ht 62.5 in | Wt 156.4 lb

## 2016-08-29 DIAGNOSIS — G40009 Localization-related (focal) (partial) idiopathic epilepsy and epileptic syndromes with seizures of localized onset, not intractable, without status epilepticus: Secondary | ICD-10-CM | POA: Diagnosis not present

## 2016-08-29 MED ORDER — LORAZEPAM 1 MG PO TABS: ORAL_TABLET | ORAL | 5 refills | Status: DC

## 2016-08-29 MED ORDER — LEVETIRACETAM 1000 MG PO TABS: 1000.0000 mg | ORAL_TABLET | Freq: Two times a day (BID) | ORAL | 3 refills | Status: DC
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
## 2016-08-29 NOTE — Patient Instructions (Signed)
1. Continue Keppra 1000mg  twice a day 2. May take Ativan 1mg  tablet as rescue for seizure. Do not take more than 2 tablets in a 24hour period. 3. Follow-up in 6 months, call for any changes  Seizure Precautions: 1. If medication has been prescribed for you to prevent seizures, take it exactly as directed.  Do not stop taking the medicine without talking to your doctor first, even if you have not had a seizure in a long time.   2. Avoid activities in which a seizure would cause danger to yourself or to others.  Don't operate dangerous machinery, swim alone, or climb in high or dangerous places, such as on ladders, roofs, or girders.  Do not drive unless your doctor says you may.  3. If you have any warning that you may have a seizure, lay down in a safe place where you can't hurt yourself.    4.  No driving for 6 months from last seizure, as per [facility].   Please refer to the following link on the [place] website for more information: http://www.epilepsyfoundation.org/answerplace/Social/driving/drivingu.cfm   5.  Maintain good sleep hygiene. Avoid alcohol.  6.  Contact your doctor if you have any problems that may be related to the medicine you are taking.  7.  Call 911 and bring the patient back to the ED if:        A.  The seizure lasts longer than 5 minutes.       B.  The patient doesn't awaken shortly after the seizure  C.  The patient has new problems such as difficulty seeing, speaking or moving  D.  The patient was injured during the seizure  E.  The patient has a temperature over 102 F (39C)  F.  The patient vomited and now is having trouble breathing

## 2016-09-04 DIAGNOSIS — R600 Localized edema: Secondary | ICD-10-CM | POA: Diagnosis not present

## 2016-09-04 DIAGNOSIS — G40909 Epilepsy, unspecified, not intractable, without status epilepticus: Secondary | ICD-10-CM | POA: Diagnosis not present

## 2016-09-07 ENCOUNTER — Encounter: Payer: Self-pay | Admitting: Neurology

## 2016-09-12 DIAGNOSIS — N8110 Cystocele, unspecified: Secondary | ICD-10-CM | POA: Diagnosis not present

## 2016-09-12 DIAGNOSIS — L218 Other seborrheic dermatitis: Secondary | ICD-10-CM | POA: Diagnosis not present

## 2016-09-12 DIAGNOSIS — L570 Actinic keratosis: Secondary | ICD-10-CM | POA: Diagnosis not present

## 2016-09-12 DIAGNOSIS — L821 Other seborrheic keratosis: Secondary | ICD-10-CM | POA: Diagnosis not present

## 2016-09-22 ENCOUNTER — Ambulatory Visit: Payer: Medicare Other | Admitting: Neurology

## 2016-10-02 DIAGNOSIS — R413 Other amnesia: Secondary | ICD-10-CM | POA: Diagnosis not present

## 2016-10-02 DIAGNOSIS — G40909 Epilepsy, unspecified, not intractable, without status epilepticus: Secondary | ICD-10-CM | POA: Diagnosis not present

## 2016-10-05 DIAGNOSIS — N8189 Other female genital prolapse: Secondary | ICD-10-CM | POA: Diagnosis not present

## 2016-10-18 DIAGNOSIS — R350 Frequency of micturition: Secondary | ICD-10-CM | POA: Diagnosis not present

## 2016-11-14 ENCOUNTER — Telehealth: Payer: Self-pay | Admitting: Neurology

## 2016-11-14 NOTE — Telephone Encounter (Signed)
Renee Zuniga 2031/03/03. Her Daughter Neelie Men called regarding having Dr. Delice Lesch refer her to a pain specialist? Someone that could look at her Neurology reports to help her. Her # is [phone]. Thank you

## 2016-11-15 NOTE — Telephone Encounter (Signed)
Contacted patients daughter. She states her mother sees Dr. Delfina Redwood and Dr. Earleen Newport but she has been in unbearable pain. She wants to know if there is someone we can refer her mother to to look at her mothers whole picture of health and get her some pain therapy. They didn't want to try the Tramadol with her being on seizure medication.

## 2016-11-15 NOTE — Telephone Encounter (Signed)
Ok to refer to Pain Management for knee pain. Thanks

## 2016-11-16 DIAGNOSIS — M170 Bilateral primary osteoarthritis of knee: Secondary | ICD-10-CM | POA: Diagnosis not present

## 2016-11-16 NOTE — Telephone Encounter (Signed)
Contacted daughter. Referral sent to [facility]. Noted for them to call daughter to schedule.

## 2016-12-15 DIAGNOSIS — Z6827 Body mass index (BMI) 27.0-27.9, adult: Secondary | ICD-10-CM | POA: Diagnosis not present

## 2016-12-15 DIAGNOSIS — E663 Overweight: Secondary | ICD-10-CM | POA: Diagnosis not present

## 2016-12-15 DIAGNOSIS — M170 Bilateral primary osteoarthritis of knee: Secondary | ICD-10-CM | POA: Diagnosis not present

## 2016-12-15 DIAGNOSIS — R252 Cramp and spasm: Secondary | ICD-10-CM | POA: Diagnosis not present

## 2016-12-21 DIAGNOSIS — R262 Difficulty in walking, not elsewhere classified: Secondary | ICD-10-CM | POA: Diagnosis not present

## 2016-12-21 DIAGNOSIS — E875 Hyperkalemia: Secondary | ICD-10-CM | POA: Diagnosis not present

## 2016-12-21 DIAGNOSIS — R2681 Unsteadiness on feet: Secondary | ICD-10-CM | POA: Diagnosis not present

## 2016-12-21 DIAGNOSIS — M6281 Muscle weakness (generalized): Secondary | ICD-10-CM | POA: Diagnosis not present

## 2016-12-25 DIAGNOSIS — R262 Difficulty in walking, not elsewhere classified: Secondary | ICD-10-CM | POA: Diagnosis not present

## 2016-12-25 DIAGNOSIS — M6281 Muscle weakness (generalized): Secondary | ICD-10-CM | POA: Diagnosis not present

## 2016-12-25 DIAGNOSIS — R2681 Unsteadiness on feet: Secondary | ICD-10-CM | POA: Diagnosis not present

## 2017-01-02 DIAGNOSIS — H5712 Ocular pain, left eye: Secondary | ICD-10-CM | POA: Diagnosis not present

## 2017-01-08 DIAGNOSIS — R262 Difficulty in walking, not elsewhere classified: Secondary | ICD-10-CM | POA: Diagnosis not present

## 2017-01-08 DIAGNOSIS — M2681 Anterior soft tissue impingement: Secondary | ICD-10-CM | POA: Diagnosis not present

## 2017-01-08 DIAGNOSIS — M6281 Muscle weakness (generalized): Secondary | ICD-10-CM | POA: Diagnosis not present

## 2017-01-09 DIAGNOSIS — M6281 Muscle weakness (generalized): Secondary | ICD-10-CM | POA: Diagnosis not present

## 2017-01-09 DIAGNOSIS — R262 Difficulty in walking, not elsewhere classified: Secondary | ICD-10-CM | POA: Diagnosis not present

## 2017-01-09 DIAGNOSIS — R2681 Unsteadiness on feet: Secondary | ICD-10-CM | POA: Diagnosis not present

## 2017-01-23 DIAGNOSIS — R262 Difficulty in walking, not elsewhere classified: Secondary | ICD-10-CM | POA: Diagnosis not present

## 2017-01-23 DIAGNOSIS — R2681 Unsteadiness on feet: Secondary | ICD-10-CM | POA: Diagnosis not present

## 2017-01-23 DIAGNOSIS — M6281 Muscle weakness (generalized): Secondary | ICD-10-CM | POA: Diagnosis not present

## 2017-01-31 DIAGNOSIS — R600 Localized edema: Secondary | ICD-10-CM | POA: Diagnosis not present

## 2017-02-08 ENCOUNTER — Telehealth: Payer: Self-pay | Admitting: Neurology

## 2017-02-08 DIAGNOSIS — M2142 Flat foot [pes planus] (acquired), left foot: Secondary | ICD-10-CM | POA: Diagnosis not present

## 2017-02-08 DIAGNOSIS — M2141 Flat foot [pes planus] (acquired), right foot: Secondary | ICD-10-CM | POA: Diagnosis not present

## 2017-02-08 DIAGNOSIS — M170 Bilateral primary osteoarthritis of knee: Secondary | ICD-10-CM | POA: Diagnosis not present

## 2017-02-08 NOTE — Telephone Encounter (Signed)
Clld daughter, Minnah Donaway [facility] re concerns for pt's ability to begin to driving again.

## 2017-02-08 NOTE — Telephone Encounter (Signed)
PT's daughter Naveah Neveu is questioning her mom starting to drive again and would like to talk to Dr Delice Lesch about that/Dawn [phone]

## 2017-02-13 ENCOUNTER — Telehealth: Payer: Self-pay

## 2017-02-13 NOTE — Telephone Encounter (Signed)
Spoke to daughter Davanee Coverstone. Advised per Dr. Delice Lesch able to work-in on 03/28 @11 :30pm. Daughter was grateful. Will make sure patient is not going out of town that week and call back. Request all calls are made to her (daughter Devyani Saelee [facility] and spouse.)

## 2017-02-13 NOTE — Telephone Encounter (Signed)
Patient's daughter Adoniah Seyoum requests earlier OV. Next OV scheduled for 05/29/17. Daughter states pt's memory loss has increased. Pt thinks she will be eligible to drive again in mid- March. She would like for MD to tell pt she can no longer drive. Spouse & daughter are afraid pt will be mad if she is told by family she cannot drive and would not listen.  Daughter really seeking help as she states pt memory has definitely declined and the pt has mentioned they are trying to take all of her freedom away when they advise her she will need to see MD soon.

## 2017-02-16 ENCOUNTER — Encounter: Payer: Self-pay | Admitting: Neurology

## 2017-02-16 ENCOUNTER — Ambulatory Visit (INDEPENDENT_AMBULATORY_CARE_PROVIDER_SITE_OTHER): Payer: Medicare Other | Admitting: Neurology

## 2017-02-16 VITALS — BP 110/80 | HR 93 | Temp 98.4°F | Resp 18 | Ht 62.5 in | Wt 160.0 lb

## 2017-02-16 DIAGNOSIS — G40009 Localization-related (focal) (partial) idiopathic epilepsy and epileptic syndromes with seizures of localized onset, not intractable, without status epilepticus: Secondary | ICD-10-CM

## 2017-02-16 DIAGNOSIS — R413 Other amnesia: Secondary | ICD-10-CM

## 2017-02-16 MED ORDER — LEVETIRACETAM 1000 MG PO TABS: 1000.0000 mg | ORAL_TABLET | Freq: Two times a day (BID) | ORAL | 3 refills | Status: DC
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
## 2017-02-26 ENCOUNTER — Telehealth: Payer: Self-pay | Admitting: Neurology

## 2017-02-26 DIAGNOSIS — J0100 Acute maxillary sinusitis, unspecified: Secondary | ICD-10-CM | POA: Diagnosis not present

## 2017-02-26 DIAGNOSIS — M170 Bilateral primary osteoarthritis of knee: Secondary | ICD-10-CM | POA: Diagnosis not present

## 2017-02-26 NOTE — Telephone Encounter (Signed)
PT's daughter Renee Zuniga called and has some questions regarding her driving/Dawn GY#[phone]

## 2017-02-27 ENCOUNTER — Telehealth: Payer: Self-pay | Admitting: Neurology

## 2017-02-27 ENCOUNTER — Ambulatory Visit: Payer: Medicare Other | Admitting: Podiatry

## 2017-02-27 NOTE — Telephone Encounter (Signed)
Renee Zuniga 19-Dec-2030. Her daughter called. She said she is not aware of when she gets in the car and where she goes. She can't remember who she is at times. Her daughter feels she might should call 911. She said she is having an episode now.  Please Advise. Her #[phone]. Thank you

## 2017-02-27 NOTE — Telephone Encounter (Signed)
Refer to pre message.
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
## 2017-02-28 ENCOUNTER — Encounter: Payer: Self-pay | Admitting: Neurology

## 2017-02-28 NOTE — Telephone Encounter (Signed)
Letter ready for pickup. thanks

## 2017-02-28 NOTE — Telephone Encounter (Signed)
Clld pt's daughter Renee Zuniga - LMOVM advsng the letter she requested is upfront , ready for pickup.

## 2017-03-02 ENCOUNTER — Telehealth: Payer: Self-pay | Admitting: Neurology

## 2017-03-02 NOTE — Telephone Encounter (Signed)
She would like you to please call her regarding her last office visit. Thanks

## 2017-03-05 NOTE — Telephone Encounter (Signed)
Returned pt's call - [place] re concerns from her [place].

## 2017-03-06 ENCOUNTER — Ambulatory Visit: Payer: Medicare Other | Admitting: Sports Medicine

## 2017-03-07 ENCOUNTER — Ambulatory Visit: Payer: Medicare Other | Admitting: Neurology

## 2017-03-13 ENCOUNTER — Telehealth: Payer: Self-pay | Admitting: *Deleted

## 2017-03-13 NOTE — Telephone Encounter (Signed)
Pt's husband, Timmothy Sours states received message to reschedule 03/15/2017 surgery with Dr. Amalia Hailey, and please contact pt at [phone].

## 2017-03-14 ENCOUNTER — Other Ambulatory Visit: Payer: Self-pay | Admitting: Neurology

## 2017-03-14 DIAGNOSIS — G40009 Localization-related (focal) (partial) idiopathic epilepsy and epileptic syndromes with seizures of localized onset, not intractable, without status epilepticus: Secondary | ICD-10-CM

## 2017-03-14 NOTE — Telephone Encounter (Signed)
I attempted to return patient's call.  She is not scheduled for surgery.  She is a new patient scheduled to see Dr. Milinda Pointer on April 19.  Just saw a message from [facility] that she called patient to reschedule her appointment due to a schedule conflict.

## 2017-03-15 ENCOUNTER — Ambulatory Visit: Payer: Medicare Other | Admitting: Podiatry

## 2017-03-29 ENCOUNTER — Encounter: Payer: Self-pay | Admitting: Podiatry

## 2017-03-29 ENCOUNTER — Ambulatory Visit (INDEPENDENT_AMBULATORY_CARE_PROVIDER_SITE_OTHER): Payer: Medicare Other

## 2017-03-29 ENCOUNTER — Ambulatory Visit (INDEPENDENT_AMBULATORY_CARE_PROVIDER_SITE_OTHER): Payer: Medicare Other | Admitting: Podiatry

## 2017-03-29 VITALS — BP 142/83 | HR 74 | Resp 16

## 2017-03-29 DIAGNOSIS — M779 Enthesopathy, unspecified: Secondary | ICD-10-CM

## 2017-03-29 DIAGNOSIS — M2041 Other hammer toe(s) (acquired), right foot: Secondary | ICD-10-CM

## 2017-03-29 DIAGNOSIS — M2140 Flat foot [pes planus] (acquired), unspecified foot: Secondary | ICD-10-CM

## 2017-03-29 DIAGNOSIS — M2042 Other hammer toe(s) (acquired), left foot: Secondary | ICD-10-CM

## 2017-03-29 NOTE — Progress Notes (Signed)
Subjective:     Patient ID: Renee Zuniga, female   DOB: Jul 15, 1931, 83 y.o.   MRN: [id number]  HPI patient has significant flatfoot deformity left over right with lesion formation pain if she's active too long and orthotics which are no longer providing support   Review of Systems  All other systems reviewed and are negative.      Objective:   Physical Exam  Constitutional: She is oriented to person, place, and time.  Cardiovascular: Intact distal pulses.   Musculoskeletal: Normal range of motion.  Neurological: She is oriented to person, place, and time.  Skin: Skin is warm.  Nursing note and vitals reviewed.  Neurovascular status intact muscle strength is adequate with diminished range of motion subtalar midtarsal joint and moderate equinus noted bilateral. Severe flatfoot deformity left over right with medial border of the talus left and stress against the posterior tibial tendon. 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 I do not recommend bracing currently as she had in the past cannot tolerate and I think it would be the same situation today. I do think an accommodative orthotic with a slightly more support would be of benefit to her   AP lateral views indicated significant collapse medial longitudinal arch left over right

## 2017-04-04 ENCOUNTER — Other Ambulatory Visit: Payer: Medicare Other

## 2017-04-04 DIAGNOSIS — M779 Enthesopathy, unspecified: Secondary | ICD-10-CM | POA: Diagnosis not present

## 2017-04-13 DIAGNOSIS — E039 Hypothyroidism, unspecified: Secondary | ICD-10-CM | POA: Diagnosis not present

## 2017-04-13 DIAGNOSIS — M8588 Other specified disorders of bone density and structure, other site: Secondary | ICD-10-CM | POA: Diagnosis not present

## 2017-04-13 DIAGNOSIS — G40909 Epilepsy, unspecified, not intractable, without status epilepticus: Secondary | ICD-10-CM | POA: Diagnosis not present

## 2017-04-13 DIAGNOSIS — Z0000 Encounter for general adult medical examination without abnormal findings: Secondary | ICD-10-CM | POA: Diagnosis not present

## 2017-04-13 DIAGNOSIS — N183 Chronic kidney disease, stage 3 (moderate): Secondary | ICD-10-CM | POA: Diagnosis not present

## 2017-04-13 DIAGNOSIS — I10 Essential (primary) hypertension: Secondary | ICD-10-CM | POA: Diagnosis not present

## 2017-04-13 DIAGNOSIS — Z1389 Encounter for screening for other disorder: Secondary | ICD-10-CM | POA: Diagnosis not present

## 2017-04-13 DIAGNOSIS — E7800 Pure hypercholesterolemia, unspecified: Secondary | ICD-10-CM | POA: Diagnosis not present

## 2017-04-13 DIAGNOSIS — R413 Other amnesia: Secondary | ICD-10-CM | POA: Diagnosis not present

## 2017-04-18 DIAGNOSIS — H5213 Myopia, bilateral: Secondary | ICD-10-CM | POA: Diagnosis not present

## 2017-04-18 DIAGNOSIS — H2513 Age-related nuclear cataract, bilateral: Secondary | ICD-10-CM | POA: Diagnosis not present

## 2017-04-18 DIAGNOSIS — H52203 Unspecified astigmatism, bilateral: Secondary | ICD-10-CM | POA: Diagnosis not present

## 2017-04-18 DIAGNOSIS — H524 Presbyopia: Secondary | ICD-10-CM | POA: Diagnosis not present

## 2017-04-23 ENCOUNTER — Ambulatory Visit (INDEPENDENT_AMBULATORY_CARE_PROVIDER_SITE_OTHER): Payer: Medicare Other | Admitting: Podiatry

## 2017-04-23 DIAGNOSIS — M779 Enthesopathy, unspecified: Secondary | ICD-10-CM

## 2017-04-23 DIAGNOSIS — M2140 Flat foot [pes planus] (acquired), unspecified foot: Secondary | ICD-10-CM

## 2017-04-23 NOTE — Patient Instructions (Signed)

## 2017-04-24 NOTE — Progress Notes (Signed)
Patient presents for orthotic pick up.  Verbal and written break in and wear instructions given.  Patient will follow up in 4 weeks if symptoms worsen or fail to improve. 

## 2017-05-07 ENCOUNTER — Encounter (HOSPITAL_COMMUNITY): Payer: Self-pay | Admitting: *Deleted

## 2017-05-07 ENCOUNTER — Emergency Department (HOSPITAL_COMMUNITY)
Admission: EM | Admit: 2017-05-07 | Discharge: 2017-05-07 | Disposition: A | Discharge status: Home / Self Care | Payer: Medicare Other | Attending: Emergency Medicine | Admitting: Emergency Medicine

## 2017-05-07 DIAGNOSIS — Z79899 Other long term (current) drug therapy: Secondary | ICD-10-CM | POA: Insufficient documentation

## 2017-05-07 DIAGNOSIS — Z8673 Personal history of transient ischemic attack (TIA), and cerebral infarction without residual deficits: Secondary | ICD-10-CM | POA: Insufficient documentation

## 2017-05-07 DIAGNOSIS — R569 Unspecified convulsions: Secondary | ICD-10-CM | POA: Diagnosis not present

## 2017-05-07 DIAGNOSIS — I10 Essential (primary) hypertension: Secondary | ICD-10-CM | POA: Insufficient documentation

## 2017-05-07 DIAGNOSIS — R404 Transient alteration of awareness: Secondary | ICD-10-CM | POA: Diagnosis not present

## 2017-05-07 HISTORY — DX: Unspecified convulsions: R56.9

## 2017-05-07 NOTE — ED Notes (Signed)
Bed: WA05 Expected date:  Expected time:  Means of arrival:  Comments: 

## 2017-05-07 NOTE — ED Triage Notes (Signed)
Per EMS, pt from home had witnessed absence seizure. Pt was alert with EMS but took longer to become oriented than usual. Pt is oriented upon EMS arrival. Pt is on Keppra, which she has been taking as prescribed.

## 2017-05-07 NOTE — ED Provider Notes (Signed)
[facility] DEPT Provider Note   CSN: [id number] Arrival date & time: 05/07/17  1057     History   Chief Complaint Chief Complaint  Patient presents with  . Seizures    HPI Renee Zuniga is a 83 y.o. female.  83 yo F with a chief complaint of confusion. This lasted for a couple hours. The patient was unable to put her medications into their individual days. She also was confused to where she was and refused to eat or drink anything. Family states that she's been having these events off and on for the past couple years. She is now back to her baseline. Denied head injury loss of consciousness chest pain shortness of breath abdominal pain cough congestion dysuria. They think it may be related to decreased oral intake. She has not had a lot to drink over the past day as she recently traveled back from [place].   The history is provided by the patient, the spouse and a relative.  Seizures   This is a recurrent problem. The current episode started 3 to 5 hours ago. The problem has been resolved. There was 1 seizure. The most recent episode lasted 30 to 120 seconds. Pertinent negatives include no headaches, no visual disturbance, no chest pain, no nausea and no vomiting. confusion The episode was witnessed. The seizure(s) had no focality. There has been no fever.    Past Medical History:  Diagnosis Date  . Arthritis   . Essential hypertension, benign   . Heart murmur   . High blood pressure   . Hypercholesteremia   . Insomnia   . Memory loss   . Migraine headache   . Mitral valve prolapse   . Osteoarthritis   . Osteopenia   . Seizures ([place])   . Tinnitus   . Vitamin D deficiency     Patient Active Problem List   Diagnosis Date Noted  . Status epilepticus ([place])   . Aphasia 08/19/2016  . Seizure ([place]) 08/19/2016  . Localization-related idiopathic epilepsy and epileptic syndromes with seizures of localized onset, not intractable, without status epilepticus ([place]) 08/12/2015   . TIA (transient ischemic attack) 11/09/2014  . HTN (hypertension) 11/09/2014  . Bilateral foot pain 03/31/2014  . Pes planus 03/31/2014  . Subluxation of ankle joint 03/31/2014  . Arthritis   . Heart murmur   . Mitral valve prolapse   . Osteoarthritis   . Vitamin D deficiency   . Migraine headache   . Hypercholesteremia   . Tinnitus   . Memory loss     Past Surgical History:  Procedure Laterality Date  . CHOLECYSTECTOMY    . CORRECTION HAMMER TOE Bilateral   . GALLBLADDER SURGERY    . OOPHORECTOMY     benign tumor, per patient  . PARTIAL HYSTERECTOMY  1974   due to uterine prolaspe    OB History    No data available       Home Medications    Prior to Admission medications   Medication Sig Start Date End Date Taking? Authorizing Provider  Calcium Carb-Cholecalciferol (OYSTER SHELL CALCIUM/VITAMIN D) 500-200 MG-UNIT TABS Take 1 tablet by mouth 2 (two) times daily.    [provider]  chlordiazePOXIDE (LIBRIUM) 10 MG capsule Take 10 mg by mouth daily.     [provider]  diclofenac sodium (VOLTAREN) 1 % GEL Apply 2 g topically daily. For knee pain    [provider]  ezetimibe (ZETIA) 10 MG tablet Take 10 mg by mouth  at bedtime.     [provider]  ibandronate (BONIVA) 150 MG tablet Take 150 mg by mouth every 30 (thirty) days. Take in the morning with a full glass of water, on an empty stomach, and do not take anything else by mouth or lie down for the next 30 min.    [provider]  ketoconazole (NIZORAL) 2 % shampoo Apply 1 application topically 3 (three) times a week. Apply to scalp after shampooing    [provider]  levETIRAcetam (KEPPRA) 1000 MG tablet Take 1 tablet (1,000 mg total) by mouth 2 (two) times daily. 02/16/17   Cameron Sprang, MD  levothyroxine (SYNTHROID, LEVOTHROID) 50 MCG tablet Take 50 mcg by mouth daily before breakfast.    [provider]  lisinopril (PRINIVIL,ZESTRIL) 10 MG tablet  Take 10 mg by mouth daily.    [provider]  LORazepam (ATIVAN) 1 MG tablet TAKE 1 TABLET AS NEEDED FOR SEIZURE. DO NOT TAKE MORE THAN 2 IN 24 HRS. Patient not taking: Reported on 03/29/2017 03/15/17   Cameron Sprang, MD  Multiple Vitamins-Minerals (MULTIVITAMIN WITH MINERALS) tablet Take 1 tablet by mouth daily.    [provider]  Multiple Vitamins-Minerals (OCUVITE PO) Take 1 tablet by mouth daily.    [provider]  Omega-3 Fatty Acids (FISH OIL) 1000 MG CAPS Take 1,000 mg by mouth daily.     [provider]  pramipexole (MIRAPEX) 0.125 MG tablet Take 0.375 mg by mouth at bedtime.     [provider]  vitamin E 400 UNIT capsule Take 400 Units by mouth daily.    [provider]    Family History Family History  Problem Relation Age of Onset  . Mitral valve prolapse Father   . Heart Problems Mother   . Breast cancer Mother   . Hypertension Mother     Social History Social History  Substance Use Topics  . Smoking status: Never Smoker  . Smokeless tobacco: Never Used  . Alcohol use 0.6 oz/week    1 Glasses of wine per week     Comment: with dinner     Allergies   Ambien [zolpidem]; Demerol [meperidine]; Lunesta [eszopiclone]; and Tramadol   Review of Systems Review of Systems  Constitutional: Positive for activity change. Negative for chills and fever.  HENT: Negative for congestion and rhinorrhea.   Eyes: Negative for redness and visual disturbance.  Respiratory: Negative for shortness of breath and wheezing.   Cardiovascular: Negative for chest pain and palpitations.  Gastrointestinal: Negative for nausea and vomiting.  Genitourinary: Negative for dysuria and urgency.  Musculoskeletal: Negative for arthralgias and myalgias.  Skin: Negative for pallor and wound.  Neurological: Positive for seizures. Negative for dizziness and headaches.     Physical Exam Updated Vital Signs BP (!) 159/110 (BP Location: Left  Arm)   Pulse 83   Temp 98.1 F (36.7 C) (Oral)   Resp 15   SpO2 100%   Physical Exam  Constitutional: She is oriented to person, place, and time. She appears well-developed and well-nourished. No distress.  HENT:  Head: Normocephalic and atraumatic.  Eyes: EOM are normal. Pupils are equal, round, and reactive to light.  Neck: Normal range of motion. Neck supple.  Cardiovascular: Normal rate and regular rhythm.  Exam reveals no gallop and no friction rub.   No murmur heard. Pulmonary/Chest: Effort normal. She has no wheezes. She has no rales.  Abdominal: Soft. She exhibits no distension. There is no tenderness.  Musculoskeletal: She exhibits no edema or tenderness.  Neurological: She is alert and oriented to person, place, and time. She has normal strength. No cranial nerve deficit or sensory deficit. Gait abnormal. Coordination normal. GCS eye subscore is 4. GCS verbal subscore is 5. GCS motor subscore is 6. She displays no Babinski's sign on the right side. She displays no Babinski's sign on the left side.  Reflex Scores:      Tricep reflexes are 2+ on the right side and 2+ on the left side.      Bicep reflexes are 2+ on the right side and 2+ on the left side.      Brachioradialis reflexes are 2+ on the right side and 2+ on the left side.      Patellar reflexes are 2+ on the right side and 2+ on the left side.      Achilles reflexes are 2+ on the right side and 2+ on the left side. Unsteady with gait which is at her baseline per her family.  Skin: Skin is warm and dry. She is not diaphoretic.  Psychiatric: She has a normal mood and affect. Her behavior is normal.  Nursing note and vitals reviewed.    ED Treatments / Results  Labs (all labs ordered are listed, but only abnormal results are displayed) Labs Reviewed - No data to display  EKG  EKG Interpretation None       Radiology No results found.  Procedures Procedures (including critical care time)  Medications  Ordered in ED Medications - No data to display   Initial Impression / Assessment and Plan / ED Course  I have reviewed the triage vital signs and the nursing notes.  Pertinent labs & imaging results that were available during my care of the patient were reviewed by me and considered in my medical decision making (see chart for details).     83 yo F with a chief complaint of confusion. This is apparently how she's been having seizures. She has been seen by a neurologist and is on antiepileptic medications. Denies noncompliance. At this point she is back to her baseline has a unremarkable neuro exam. Will have the family contact the neurologist in the morning and discuss if her therapy needs to change her further testing is warranted.  11:51 AM:  I have discussed the diagnosis/risks/treatment options with the patient and family and believe the pt to be eligible for discharge home to follow-up with Neuro. We also discussed returning to the ED immediately if new or worsening sx occur. We discussed the sx which are most concerning (e.g., sudden worsening pain, fever, inability to tolerate by mouth) that necessitate immediate return. Medications administered to the patient during their visit and any new prescriptions provided to the patient are listed below.  Medications given during this visit Medications - No data to display   The patient appears reasonably screen and/or stabilized for discharge and I doubt any other medical condition or other [facility] requiring further screening, evaluation, or treatment in the ED at this time prior to discharge.    Final Clinical Impressions(s) / ED Diagnoses   Final diagnoses:  Seizure-like activity [facility])    New Prescriptions New Prescriptions   No medications on file     Deno Etienne, DO 05/07/17 1151

## 2017-05-08 ENCOUNTER — Telehealth: Payer: Self-pay

## 2017-05-08 NOTE — Telephone Encounter (Signed)
It may have been related to combination time change/jet lag and dehydration. Let's see how she does back with regular activities, if episodes continue, pls call our office. Thanks

## 2017-05-08 NOTE — Telephone Encounter (Signed)
-----   Message from Cameron Sprang, MD sent at 05/08/2017  8:57 AM EDT ----- Regarding: pls f/u Pls call her daughter Shiesha Jahn. The patient can be very belligerent and does not remember her seizures so she denies them. She was in the ER this weekend. Pls ask about possible triggers for recent seizure (missing medication, sleep deprivation, alcohol). Since she had another seizure, no driving (she will not be happy). Thanks

## 2017-05-08 NOTE — Telephone Encounter (Signed)
Called pt daughter, Kyler Lerette.  She states that pt took a recent trip to [place], and this episode occurred the day after coming back.  Pt and husband took an 8 day boat trip and during that time the pt slept "great", relaxed while reading, and ate "almost too much".  They stayed in [place] for 2 days, before flying to [place].  Husband noticed that pt did not drink much water, only tea, before the flight.  She has a prolapsed bladder and does not like having to "deal" with it during flights.  Daughter and husband are wondering if this episode is related to dehydration.

## 2017-05-09 NOTE — Telephone Encounter (Signed)
Spoke with pt husband, Timmothy Sours.  He states that pt is in good spirits as of yesterday.   While at the ER she did receive some oxygen, which husband says worked "wonders" for her.  I relayed message below.  Husband understands.

## 2017-05-09 NOTE — Telephone Encounter (Signed)
Patient returned call after office hours on 05/08/17. Thanks

## 2017-05-15 ENCOUNTER — Other Ambulatory Visit: Payer: Self-pay | Admitting: Internal Medicine

## 2017-05-15 DIAGNOSIS — Z1231 Encounter for screening mammogram for malignant neoplasm of breast: Secondary | ICD-10-CM

## 2017-05-16 ENCOUNTER — Ambulatory Visit
Admission: RE | Admit: 2017-05-16 | Discharge: 2017-05-16 | Disposition: A | Discharge status: Home / Self Care | Payer: Medicare Other | Source: Ambulatory Visit | Attending: Internal Medicine | Admitting: Internal Medicine

## 2017-05-16 DIAGNOSIS — Z1231 Encounter for screening mammogram for malignant neoplasm of breast: Secondary | ICD-10-CM | POA: Diagnosis not present

## 2017-05-17 DIAGNOSIS — M170 Bilateral primary osteoarthritis of knee: Secondary | ICD-10-CM | POA: Diagnosis not present

## 2017-05-29 ENCOUNTER — Ambulatory Visit: Payer: Medicare Other | Admitting: Neurology

## 2017-05-30 DIAGNOSIS — N183 Chronic kidney disease, stage 3 (moderate): Secondary | ICD-10-CM | POA: Diagnosis not present

## 2017-05-30 DIAGNOSIS — R252 Cramp and spasm: Secondary | ICD-10-CM | POA: Diagnosis not present

## 2017-05-30 DIAGNOSIS — G2581 Restless legs syndrome: Secondary | ICD-10-CM | POA: Diagnosis not present

## 2017-05-30 DIAGNOSIS — E039 Hypothyroidism, unspecified: Secondary | ICD-10-CM | POA: Diagnosis not present

## 2017-05-30 DIAGNOSIS — I10 Essential (primary) hypertension: Secondary | ICD-10-CM | POA: Diagnosis not present

## 2017-05-30 DIAGNOSIS — E785 Hyperlipidemia, unspecified: Secondary | ICD-10-CM | POA: Diagnosis not present

## 2017-05-30 DIAGNOSIS — E559 Vitamin D deficiency, unspecified: Secondary | ICD-10-CM | POA: Diagnosis not present

## 2017-08-16 DIAGNOSIS — M170 Bilateral primary osteoarthritis of knee: Secondary | ICD-10-CM | POA: Diagnosis not present

## 2017-08-20 ENCOUNTER — Encounter: Payer: Self-pay | Admitting: Neurology

## 2017-08-20 ENCOUNTER — Ambulatory Visit (INDEPENDENT_AMBULATORY_CARE_PROVIDER_SITE_OTHER): Payer: Medicare Other | Admitting: Neurology

## 2017-08-20 VITALS — BP 134/76 | HR 92 | Ht 63.0 in | Wt 164.0 lb

## 2017-08-20 DIAGNOSIS — G40009 Localization-related (focal) (partial) idiopathic epilepsy and epileptic syndromes with seizures of localized onset, not intractable, without status epilepticus: Secondary | ICD-10-CM | POA: Diagnosis not present

## 2017-08-20 DIAGNOSIS — G3184 Mild cognitive impairment, so stated: Secondary | ICD-10-CM
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
## 2017-08-20 NOTE — Patient Instructions (Signed)
1. Continue Keppra 1000mg  twice a day 2. Schedule Neurocognitive testing with Dr. Si Raider 3. Follow-up after testing  You have been referred for a neurocognitive evaluation in our office.   The evaluation takes approximately two hours. The first part of the appointment is a clinical interview with the neuropsychologist (Dr. Macarthur Critchley). Please bring someone with you to this appointment if possible, as it is helpful for Dr. Si Raider to hear from both you and another adult who knows you well. After speaking with Dr. Si Raider, you will complete testing with her technician. The testing includes a variety of tasks- mostly question-and-answer, some paper-and-pencil. There is nothing you need to do to prepare for this appointment, but having a good night's sleep prior to the testing, and bringing eyeglasses and hearing aids (if you wear them), is advised.   About a week after the evaluation, you will return to follow up with Dr. Si Raider to review the test results. This appointment is about 30 minutes. If you would like a family member to receive this information as well, please bring them to the appointment.   We have to reserve several hours of the neuropsychologist's time and the psychometrician's time for your evaluation appointment. As such, please note that there is a No-Show fee of $100. If you are unable to attend any of your appointments, please contact our office as soon as possible to reschedule.  Seizure Precautions:  1. If medication has been prescribed for you to prevent seizures, take it exactly as directed.  Do not stop taking the medicine without talking to your doctor first, even if you have not had a seizure in a long time.   2. Avoid activities in which a seizure would cause danger to yourself or to others.  Don't operate dangerous machinery, swim alone, or climb in high or dangerous places, such as on ladders, roofs, or girders.  Do not drive unless your doctor says you may.  3. If you  have any warning that you may have a seizure, lay down in a safe place where you can't hurt yourself.    4.  No driving for 6 months from last seizure, as per [facility].   Please refer to the following link on the [facility] website for more information: http://[email or web address].org/[email or web address]/driving/drivingu.cfm   5.  Maintain good sleep hygiene. Avoid alcohol  6.  Contact your doctor if you have any problems that may be related to the medicine you are taking.  7.  Call 911 and bring the patient back to the ED if:        A.  The seizure lasts longer than 5 minutes.       B.  The patient doesn't awaken shortly after the seizure  C.  The patient has new problems such as difficulty seeing, speaking or moving  D.  The patient was injured during the seizure  E.  The patient has a temperature over 102 F (39C)  F.  The patient vomited and now is having trouble breathing

## 2017-08-22 ENCOUNTER — Encounter: Payer: Self-pay | Admitting: Neurology

## 2017-08-22 DIAGNOSIS — Z23 Encounter for immunization: Secondary | ICD-10-CM | POA: Diagnosis not present

## 2017-08-22 DIAGNOSIS — G3184 Mild cognitive impairment, so stated: Secondary | ICD-10-CM | POA: Insufficient documentation

## 2017-08-22 HISTORY — DX: Mild cognitive impairment of uncertain or unknown etiology: G31.84

## 2017-08-23 DIAGNOSIS — E039 Hypothyroidism, unspecified: Secondary | ICD-10-CM | POA: Diagnosis not present

## 2017-08-23 DIAGNOSIS — M170 Bilateral primary osteoarthritis of knee: Secondary | ICD-10-CM | POA: Diagnosis not present

## 2017-08-23 DIAGNOSIS — I10 Essential (primary) hypertension: Secondary | ICD-10-CM | POA: Diagnosis not present

## 2017-08-23 DIAGNOSIS — M810 Age-related osteoporosis without current pathological fracture: Secondary | ICD-10-CM | POA: Diagnosis not present

## 2017-08-23 DIAGNOSIS — M1990 Unspecified osteoarthritis, unspecified site: Secondary | ICD-10-CM | POA: Diagnosis not present

## 2017-08-23 DIAGNOSIS — N183 Chronic kidney disease, stage 3 (moderate): Secondary | ICD-10-CM | POA: Diagnosis not present

## 2017-08-23 DIAGNOSIS — E785 Hyperlipidemia, unspecified: Secondary | ICD-10-CM | POA: Diagnosis not present

## 2017-08-30 DIAGNOSIS — M170 Bilateral primary osteoarthritis of knee: Secondary | ICD-10-CM | POA: Diagnosis not present

## 2017-09-10 ENCOUNTER — Telehealth: Payer: Self-pay | Admitting: Neurology

## 2017-09-10 NOTE — Telephone Encounter (Signed)
Renee Zuniga from [place] called to let you know that her family is requesting something for her memory. She is also scheduled to see Dr. Si Raider in December. Please Advise. Thanks

## 2017-09-11 NOTE — Telephone Encounter (Signed)
[facility] for Renee Zuniga requesting notes be faxed to [phone]

## 2017-09-11 NOTE — Telephone Encounter (Signed)
Can you request for the notes from yesterday from [place] so we can see who was asking and know who to call back?

## 2017-09-14 DIAGNOSIS — M170 Bilateral primary osteoarthritis of knee: Secondary | ICD-10-CM | POA: Diagnosis not present

## 2017-09-18 ENCOUNTER — Telehealth: Payer: Self-pay

## 2017-09-18 NOTE — Telephone Encounter (Signed)
Spoke with Tammy from [place].  She says that pt's family has asked Dr. Delfina Redwood about starting Aricept.  Tammy is concerned that this might have some effect on pt's Neurocoginitive testing in December.  I let her know that if Dr. Delfina Redwood would like to prescribe that it should be fine since many of our pt's start Aricept prior to seeing Dr. Si Raider.  She says that she will talk with Dr. Delfina Redwood and he may want to reach out to Dr. Delice Lesch.

## 2017-09-20 DIAGNOSIS — E039 Hypothyroidism, unspecified: Secondary | ICD-10-CM | POA: Diagnosis not present

## 2017-09-20 DIAGNOSIS — I10 Essential (primary) hypertension: Secondary | ICD-10-CM | POA: Diagnosis not present

## 2017-09-20 DIAGNOSIS — E785 Hyperlipidemia, unspecified: Secondary | ICD-10-CM | POA: Diagnosis not present

## 2017-09-20 DIAGNOSIS — N183 Chronic kidney disease, stage 3 (moderate): Secondary | ICD-10-CM | POA: Diagnosis not present

## 2017-09-20 DIAGNOSIS — M1990 Unspecified osteoarthritis, unspecified site: Secondary | ICD-10-CM | POA: Diagnosis not present

## 2017-09-20 DIAGNOSIS — M810 Age-related osteoporosis without current pathological fracture: Secondary | ICD-10-CM | POA: Diagnosis not present

## 2017-10-16 DIAGNOSIS — I10 Essential (primary) hypertension: Secondary | ICD-10-CM | POA: Diagnosis not present

## 2017-10-16 DIAGNOSIS — M810 Age-related osteoporosis without current pathological fracture: Secondary | ICD-10-CM | POA: Diagnosis not present

## 2017-10-16 DIAGNOSIS — R413 Other amnesia: Secondary | ICD-10-CM | POA: Diagnosis not present

## 2017-10-16 DIAGNOSIS — E039 Hypothyroidism, unspecified: Secondary | ICD-10-CM | POA: Diagnosis not present

## 2017-10-16 DIAGNOSIS — E559 Vitamin D deficiency, unspecified: Secondary | ICD-10-CM | POA: Diagnosis not present

## 2017-10-16 DIAGNOSIS — G40909 Epilepsy, unspecified, not intractable, without status epilepticus: Secondary | ICD-10-CM | POA: Diagnosis not present

## 2017-10-16 DIAGNOSIS — N183 Chronic kidney disease, stage 3 (moderate): Secondary | ICD-10-CM | POA: Diagnosis not present

## 2017-10-16 DIAGNOSIS — E7800 Pure hypercholesterolemia, unspecified: Secondary | ICD-10-CM | POA: Diagnosis not present

## 2017-11-06 DIAGNOSIS — H04211 Epiphora due to excess lacrimation, right lacrimal gland: Secondary | ICD-10-CM | POA: Diagnosis not present

## 2017-11-13 DIAGNOSIS — N8110 Cystocele, unspecified: Secondary | ICD-10-CM | POA: Diagnosis not present

## 2017-11-19 ENCOUNTER — Encounter: Payer: Medicare Other | Admitting: Psychology

## 2017-11-20 DIAGNOSIS — M170 Bilateral primary osteoarthritis of knee: Secondary | ICD-10-CM | POA: Diagnosis not present

## 2017-11-20 DIAGNOSIS — M25571 Pain in right ankle and joints of right foot: Secondary | ICD-10-CM | POA: Diagnosis not present

## 2017-11-22 DIAGNOSIS — G40009 Localization-related (focal) (partial) idiopathic epilepsy and epileptic syndromes with seizures of localized onset, not intractable, without status epilepticus: Secondary | ICD-10-CM | POA: Diagnosis not present

## 2017-11-22 DIAGNOSIS — S9001XD Contusion of right ankle, subsequent encounter: Secondary | ICD-10-CM | POA: Diagnosis not present

## 2017-11-22 DIAGNOSIS — M170 Bilateral primary osteoarthritis of knee: Secondary | ICD-10-CM | POA: Diagnosis not present

## 2017-11-22 DIAGNOSIS — M2142 Flat foot [pes planus] (acquired), left foot: Secondary | ICD-10-CM | POA: Diagnosis not present

## 2017-11-22 DIAGNOSIS — G3184 Mild cognitive impairment, so stated: Secondary | ICD-10-CM | POA: Diagnosis not present

## 2017-11-22 DIAGNOSIS — M2141 Flat foot [pes planus] (acquired), right foot: Secondary | ICD-10-CM | POA: Diagnosis not present

## 2017-11-23 ENCOUNTER — Ambulatory Visit (INDEPENDENT_AMBULATORY_CARE_PROVIDER_SITE_OTHER): Payer: Medicare Other | Admitting: Psychology

## 2017-11-23 DIAGNOSIS — G40009 Localization-related (focal) (partial) idiopathic epilepsy and epileptic syndromes with seizures of localized onset, not intractable, without status epilepticus: Secondary | ICD-10-CM

## 2017-11-23 DIAGNOSIS — R413 Other amnesia: Secondary | ICD-10-CM | POA: Diagnosis not present

## 2017-11-23 NOTE — Progress Notes (Signed)
NEUROPSYCHOLOGICAL INTERVIEW (CPT: [id number])  Name: Renee Zuniga Date of Birth: 10-Jan-1931 Date of Interview: 11/23/2017  Reason for Referral:  Renee Zuniga is a 83 y.o. RH female who is referred for neuropsychological evaluation by Dr. Ellouise Newer of [facility] due to concerns about worsening memory. This patient is accompanied in the office by her husband and daughter who supplement the history.  History of Presenting Problem:  Renee Zuniga is followed by Dr. Delice Lesch for recurrent episodes of confusion and history of abnormal inpatient EEG. She has focal seizures with impaired awareness. The most recent seizure episode was in May 2018. MRI brain was reportedly unremarkable. She has had several EEGS, one with reported left temporal sharp waves, and most recently with focal left temporal slowing, likely post-ictal change. She is taking Keppra 1000mg  BID with no side effects. Her family is concerned about worsening memory. The patient was last seen by Dr. Delice Lesch on 08/20/2017. MoCA was 24/30.   The patient has consistently denied memory concerns. She also denies any issues with expressive or receptive language. She is not currently driving and would like very much to return to driving. She does not believe that she has had seizures, and she reports it upsets her when others talk about her having had seizures.  Per her family, long term memory is quite intact but short term memory issues are apparent. Dr. Delfina Redwood started 5 mg of Aricept. Her daughter states the memory issues have been going on for at least 18 months, "since the seizures started". Upon direct questioning, her family states she forgets recent conversations and repeats herself. They do not notice any word finding difficulty. They do not feel she has comprehension problems but they do report she has hearing loss (she disagrees). The patient denies any difficulty with driving directions when she was driving and now as a passenger.  Her family states there was one time about 2 years ago when she got lost; the patient does recall this but states she was able to get home on her own and this problem never recurred. Her family does not have concerns about her ability (cognitively) to drive locally to familiar places. She manages her medication independently and her husband states she is able to do this without any problems. She also manages the finances and her husband also states she is very good at this. She frequently asks about her appointments but she is able to write them down and check her calendar regularly. She cooks regularly and denies any difficulty with this.  There is no known family history of seizures. Neither of her parents had dementia. Her mother lived to 68 and her father lived to 59yo. The patient did not have any siblings.  Physically, the patient denies any significant complaints other than ankle pain related to a recent fall on the ice. She is taking hydrocodone every 6 hours for this. She denies any sleep difficulty. Her husband states she gets good sleep, is in bed by 8:30 pm and gets up around 6:30 or 7 am. There have been no changes in appetite or weight.  She describes her current mood as "fine". Her family states mood is stable, "always positive". Psychiatric history was denied. She denied any current psychosocial stressors. She has strong family support. Two daughters live within 6 blocks of them. She has 14 grandchildren and continues to enjoy making crafts for them, etc.    Social History: Born/Raised: [place], [place] Education: 2 degrees, one from [place]  [employer], one from [employer] Occupational history: Retired Pharmacist, hospital Marital history: Married 66 years, 5 children (4 live locally), 14 grandchildren Alcohol: One glass of wine most nights Tobacco: Never a smoker or tobacco user   Medical History: Past Medical History:  Diagnosis Date  . Arthritis   . Essential hypertension,  benign   . Heart murmur   . High blood pressure   . Hypercholesteremia   . Insomnia   . Memory loss   . Migraine headache   . Mitral valve prolapse   . Osteoarthritis   . Osteopenia   . Seizures ([place])   . Tinnitus   . Vitamin D deficiency       Current Medications:  Outpatient Encounter Medications as of 11/23/2017  Medication Sig  . Calcium Carb-Cholecalciferol (OYSTER SHELL CALCIUM/VITAMIN D) 500-200 MG-UNIT TABS Take 1 tablet by mouth 2 (two) times daily.  . chlordiazePOXIDE (LIBRIUM) 10 MG capsule Take 10 mg by mouth daily.   . diclofenac sodium (VOLTAREN) 1 % GEL Apply 2 g topically daily. For knee pain  . ezetimibe (ZETIA) 10 MG tablet Take 10 mg by mouth at bedtime.   . ibandronate (BONIVA) 150 MG tablet Take 150 mg by mouth every 30 (thirty) days. Take in the morning with a full glass of water, on an empty stomach, and do not take anything else by mouth or lie down for the next 30 min.  Marland Kitchen ketoconazole (NIZORAL) 2 % shampoo Apply 1 application topically 3 (three) times a week. Apply to scalp after shampooing  . levETIRAcetam (KEPPRA) 1000 MG tablet Take 1 tablet (1,000 mg total) by mouth 2 (two) times daily.  Marland Kitchen levothyroxine (SYNTHROID, LEVOTHROID) 50 MCG tablet Take 50 mcg by mouth daily before breakfast.  . lisinopril (PRINIVIL,ZESTRIL) 10 MG tablet Take 10 mg by mouth daily.  Marland Kitchen LORazepam (ATIVAN) 1 MG tablet TAKE 1 TABLET AS NEEDED FOR SEIZURE. DO NOT TAKE MORE THAN 2 IN 24 HRS.  . Multiple Vitamins-Minerals (MULTIVITAMIN WITH MINERALS) tablet Take 1 tablet by mouth daily.  . Multiple Vitamins-Minerals (OCUVITE PO) Take 1 tablet by mouth daily.  . Omega-3 Fatty Acids (FISH OIL) 1000 MG CAPS Take 1,000 mg by mouth daily.   . pramipexole (MIRAPEX) 0.125 MG tablet Take 0.375 mg by mouth at bedtime.   . vitamin E 400 UNIT capsule Take 400 Units by mouth daily.   No facility-administered encounter medications on file as of 11/23/2017.      Behavioral Observations:     Appearance: Neatly and appropriately dressed and groomed Gait: Ambulated with a walker Speech: Fluent; normal rate, rhythm and volume. No significant word finding difficulty during conversational speech. Thought process: Linear, goal directed Affect: Mildly blunted, stable Interpersonal: Pleasant, appropriate   TESTING: There is medical necessity to proceed with neuropsychological assessment as the results will be used to aid in differential diagnosis and clinical decision-making and to inform specific treatment recommendations. Per the patient, her family and medical records reviewed, there has been a change in cognitive functioning and a reasonable suspicion of neurocognitive disorder, possibly related to seizure disorder.  Following the clinical interview, the patient completed a full battery of neuropsychological testing with my psychometrician under my supervision.   PLAN: The patient will return to see me for a follow-up session at which time her test performances and my impressions and treatment recommendations will be reviewed in detail.  Full report to follow.

## 2017-11-24 NOTE — Progress Notes (Signed)
   Neuropsychology Note  Renee Zuniga came in today for 1 hour of neuropsychological testing with technician, Milana Kidney, BS, under the supervision of Dr. Macarthur Critchley. The patient did not appear overtly distressed by the testing session, per behavioral observation or via self-report to the technician. Rest breaks were offered. Renee Zuniga will return within 2 weeks for a feedback session with Dr. Si Raider at which time her test performances, clinical impressions and treatment recommendations will be reviewed in detail. The patient understands she can contact our office should she require our assistance before this time.  Full report to follow.

## 2017-11-25 ENCOUNTER — Encounter: Payer: Self-pay | Admitting: Psychology

## 2017-11-26 DIAGNOSIS — M170 Bilateral primary osteoarthritis of knee: Secondary | ICD-10-CM | POA: Diagnosis not present

## 2017-11-28 DIAGNOSIS — G40009 Localization-related (focal) (partial) idiopathic epilepsy and epileptic syndromes with seizures of localized onset, not intractable, without status epilepticus: Secondary | ICD-10-CM | POA: Diagnosis not present

## 2017-11-28 DIAGNOSIS — M2142 Flat foot [pes planus] (acquired), left foot: Secondary | ICD-10-CM | POA: Diagnosis not present

## 2017-11-28 DIAGNOSIS — M170 Bilateral primary osteoarthritis of knee: Secondary | ICD-10-CM | POA: Diagnosis not present

## 2017-11-28 DIAGNOSIS — G3184 Mild cognitive impairment, so stated: Secondary | ICD-10-CM | POA: Diagnosis not present

## 2017-11-28 DIAGNOSIS — M2141 Flat foot [pes planus] (acquired), right foot: Secondary | ICD-10-CM | POA: Diagnosis not present

## 2017-11-28 DIAGNOSIS — S9001XD Contusion of right ankle, subsequent encounter: Secondary | ICD-10-CM | POA: Diagnosis not present

## 2017-11-29 DIAGNOSIS — E039 Hypothyroidism, unspecified: Secondary | ICD-10-CM | POA: Diagnosis not present

## 2017-11-29 DIAGNOSIS — G3184 Mild cognitive impairment, so stated: Secondary | ICD-10-CM | POA: Diagnosis not present

## 2017-11-29 DIAGNOSIS — M2142 Flat foot [pes planus] (acquired), left foot: Secondary | ICD-10-CM | POA: Diagnosis not present

## 2017-11-29 DIAGNOSIS — M2141 Flat foot [pes planus] (acquired), right foot: Secondary | ICD-10-CM | POA: Diagnosis not present

## 2017-11-29 DIAGNOSIS — S9001XD Contusion of right ankle, subsequent encounter: Secondary | ICD-10-CM | POA: Diagnosis not present

## 2017-11-29 DIAGNOSIS — M170 Bilateral primary osteoarthritis of knee: Secondary | ICD-10-CM | POA: Diagnosis not present

## 2017-11-29 DIAGNOSIS — G40009 Localization-related (focal) (partial) idiopathic epilepsy and epileptic syndromes with seizures of localized onset, not intractable, without status epilepticus: Secondary | ICD-10-CM | POA: Diagnosis not present

## 2017-11-30 DIAGNOSIS — M2142 Flat foot [pes planus] (acquired), left foot: Secondary | ICD-10-CM | POA: Diagnosis not present

## 2017-11-30 DIAGNOSIS — M170 Bilateral primary osteoarthritis of knee: Secondary | ICD-10-CM | POA: Diagnosis not present

## 2017-11-30 DIAGNOSIS — G40009 Localization-related (focal) (partial) idiopathic epilepsy and epileptic syndromes with seizures of localized onset, not intractable, without status epilepticus: Secondary | ICD-10-CM | POA: Diagnosis not present

## 2017-11-30 DIAGNOSIS — G3184 Mild cognitive impairment, so stated: Secondary | ICD-10-CM | POA: Diagnosis not present

## 2017-11-30 DIAGNOSIS — S9001XD Contusion of right ankle, subsequent encounter: Secondary | ICD-10-CM | POA: Diagnosis not present

## 2017-11-30 DIAGNOSIS — M2141 Flat foot [pes planus] (acquired), right foot: Secondary | ICD-10-CM | POA: Diagnosis not present

## 2017-12-06 DIAGNOSIS — I10 Essential (primary) hypertension: Secondary | ICD-10-CM | POA: Diagnosis not present

## 2017-12-06 DIAGNOSIS — E039 Hypothyroidism, unspecified: Secondary | ICD-10-CM | POA: Diagnosis not present

## 2017-12-06 DIAGNOSIS — M810 Age-related osteoporosis without current pathological fracture: Secondary | ICD-10-CM | POA: Diagnosis not present

## 2017-12-06 DIAGNOSIS — M1990 Unspecified osteoarthritis, unspecified site: Secondary | ICD-10-CM | POA: Diagnosis not present

## 2017-12-06 DIAGNOSIS — E785 Hyperlipidemia, unspecified: Secondary | ICD-10-CM | POA: Diagnosis not present

## 2017-12-06 DIAGNOSIS — N183 Chronic kidney disease, stage 3 (moderate): Secondary | ICD-10-CM | POA: Diagnosis not present

## 2017-12-11 DIAGNOSIS — H1031 Unspecified acute conjunctivitis, right eye: Secondary | ICD-10-CM | POA: Diagnosis not present

## 2017-12-12 ENCOUNTER — Telehealth: Payer: Self-pay | Admitting: Psychology

## 2017-12-12 DIAGNOSIS — G40009 Localization-related (focal) (partial) idiopathic epilepsy and epileptic syndromes with seizures of localized onset, not intractable, without status epilepticus: Secondary | ICD-10-CM | POA: Diagnosis not present

## 2017-12-12 DIAGNOSIS — M170 Bilateral primary osteoarthritis of knee: Secondary | ICD-10-CM | POA: Diagnosis not present

## 2017-12-12 DIAGNOSIS — M2142 Flat foot [pes planus] (acquired), left foot: Secondary | ICD-10-CM | POA: Diagnosis not present

## 2017-12-12 DIAGNOSIS — M2141 Flat foot [pes planus] (acquired), right foot: Secondary | ICD-10-CM | POA: Diagnosis not present

## 2017-12-12 DIAGNOSIS — S9001XD Contusion of right ankle, subsequent encounter: Secondary | ICD-10-CM | POA: Diagnosis not present

## 2017-12-12 DIAGNOSIS — G3184 Mild cognitive impairment, so stated: Secondary | ICD-10-CM | POA: Diagnosis not present

## 2017-12-14 DIAGNOSIS — M2142 Flat foot [pes planus] (acquired), left foot: Secondary | ICD-10-CM | POA: Diagnosis not present

## 2017-12-14 DIAGNOSIS — G3184 Mild cognitive impairment, so stated: Secondary | ICD-10-CM | POA: Diagnosis not present

## 2017-12-14 DIAGNOSIS — M2141 Flat foot [pes planus] (acquired), right foot: Secondary | ICD-10-CM | POA: Diagnosis not present

## 2017-12-14 DIAGNOSIS — M170 Bilateral primary osteoarthritis of knee: Secondary | ICD-10-CM | POA: Diagnosis not present

## 2017-12-14 DIAGNOSIS — S9001XD Contusion of right ankle, subsequent encounter: Secondary | ICD-10-CM | POA: Diagnosis not present

## 2017-12-14 DIAGNOSIS — G40009 Localization-related (focal) (partial) idiopathic epilepsy and epileptic syndromes with seizures of localized onset, not intractable, without status epilepticus: Secondary | ICD-10-CM | POA: Diagnosis not present

## 2017-12-17 DIAGNOSIS — G3184 Mild cognitive impairment, so stated: Secondary | ICD-10-CM | POA: Diagnosis not present

## 2017-12-17 DIAGNOSIS — M2142 Flat foot [pes planus] (acquired), left foot: Secondary | ICD-10-CM | POA: Diagnosis not present

## 2017-12-17 DIAGNOSIS — M170 Bilateral primary osteoarthritis of knee: Secondary | ICD-10-CM | POA: Diagnosis not present

## 2017-12-17 DIAGNOSIS — S9001XD Contusion of right ankle, subsequent encounter: Secondary | ICD-10-CM | POA: Diagnosis not present

## 2017-12-17 DIAGNOSIS — M2141 Flat foot [pes planus] (acquired), right foot: Secondary | ICD-10-CM | POA: Diagnosis not present

## 2017-12-17 DIAGNOSIS — G40009 Localization-related (focal) (partial) idiopathic epilepsy and epileptic syndromes with seizures of localized onset, not intractable, without status epilepticus: Secondary | ICD-10-CM | POA: Diagnosis not present

## 2017-12-20 DIAGNOSIS — G40009 Localization-related (focal) (partial) idiopathic epilepsy and epileptic syndromes with seizures of localized onset, not intractable, without status epilepticus: Secondary | ICD-10-CM | POA: Diagnosis not present

## 2017-12-20 DIAGNOSIS — M2142 Flat foot [pes planus] (acquired), left foot: Secondary | ICD-10-CM | POA: Diagnosis not present

## 2017-12-20 DIAGNOSIS — S9001XD Contusion of right ankle, subsequent encounter: Secondary | ICD-10-CM | POA: Diagnosis not present

## 2017-12-20 DIAGNOSIS — M2141 Flat foot [pes planus] (acquired), right foot: Secondary | ICD-10-CM | POA: Diagnosis not present

## 2017-12-20 DIAGNOSIS — M170 Bilateral primary osteoarthritis of knee: Secondary | ICD-10-CM | POA: Diagnosis not present

## 2017-12-20 DIAGNOSIS — G3184 Mild cognitive impairment, so stated: Secondary | ICD-10-CM | POA: Diagnosis not present

## 2017-12-24 NOTE — Progress Notes (Signed)
NEUROPSYCHOLOGICAL EVALUATION   Name:    Renee Zuniga  Date of Birth:   1931-09-28 Date of Interview:  11/23/2017 Date of Testing:  11/23/2017   Date of Feedback:  12/25/2017       Background Information:  Reason for Referral:  Renee Zuniga is a 84 y.o. female referred by Dr. Ellouise Newer to assess her current level of cognitive functioning and assist in differential diagnosis. The current evaluation consisted of a review of available medical records, an interview with the patient and her husband and daughter, and the completion of a neuropsychological testing battery. Informed consent was obtained.  History of Presenting Problem:  [name] is followed by Dr. Delice Lesch for recurrent episodes of confusion and history of abnormal inpatient EEG. She has focal seizures with impaired awareness. The most recent seizure episode was in May 2018. MRI brain was reportedly unremarkable. She has had several EEGS, one with reported left temporal sharp waves, and most recently with focal left temporal slowing, likely post-ictal change. She is taking Keppra 1069m BIDwith no side effects. Her family is concerned about worsening memory. The patient was last seen by Dr. ADelice Leschon 08/20/2017. MoCA was 24/30.   The patient has consistently denied memory concerns. She also denies any issues with expressive or receptive language. She is not currently driving and would like very much to return to driving. She does not believe that she has had seizures, and she reports it upsets her when others talk about her having had seizures.  Per her family, long term memory is quite intact but short term memory issues are apparent. Dr. PDelfina Redwoodstarted 5 mg of Aricept. Her daughter states the memory issues have been going on for at least 18 months, "since the seizures started". Upon direct questioning, her family states she forgets recent conversations and repeats herself. They do not notice any word finding difficulty.  They do not feel she has comprehension problems but they do report she has hearing loss (she disagrees). The patient denies any difficulty with driving directions when she was driving and now as a passenger. Her family states there was one time about 2 years ago when she got lost; the patient does recall this but states she was able to get home on her own and this problem never recurred. Her family does not have concerns about her ability (cognitively) to drive locally to familiar places. She manages her medication independently and her husband states she is able to do this without any problems. She also manages the finances and her husband also states she is very good at this. She frequently asks about her appointments but she is able to write them down and check her calendar regularly. She cooks regularly and denies any difficulty with this.  There is no known family history of seizures. Neither of her parents had dementia. Her mother lived to 932and her father lived to 826yo The patient did not have any siblings.  Physically, the patient denies any significant complaints other than ankle pain related to a recent fall on the ice. She is taking hydrocodone every 6 hours for this. She denies any sleep difficulty. Her husband states she gets good sleep, is in bed by 8:30 pm and gets up around 6:30 or 7 am. There have been no changes in appetite or weight.  She describes her current mood as "fine". Her family states mood is stable, "always positive". Psychiatric history was denied. She denied any current psychosocial  stressors. She has strong family support. Two daughters live within 6 blocks of them. She has 14 grandchildren and continues to enjoy making crafts for them, etc.  Social History: Born/Raised: [place], [place] Education: 2 degrees, one from [place], one from [employer] Occupational history: Retired Pharmacist, hospital Marital history: Married 26 years, 5 children (4 live  locally), 14 grandchildren Alcohol: One glass of wine most nights Tobacco: Never a smoker or tobacco user  Medical History:  Past Medical History:  Diagnosis Date  . Arthritis   . Essential hypertension, benign   . Heart murmur   . High blood pressure   . Hypercholesteremia   . Insomnia   . Memory loss   . Migraine headache   . Mitral valve prolapse   . Osteoarthritis   . Osteopenia   . Seizures ([place])   . Tinnitus   . Vitamin D deficiency     Current medications:  Outpatient Encounter Medications as of 12/25/2017  Medication Sig  . Calcium Carb-Cholecalciferol (OYSTER SHELL CALCIUM/VITAMIN D) 500-200 MG-UNIT TABS Take 1 tablet by mouth 2 (two) times daily.  . chlordiazePOXIDE (LIBRIUM) 10 MG capsule Take 10 mg by mouth daily.   . diclofenac sodium (VOLTAREN) 1 % GEL Apply 2 g topically daily. For knee pain  . ezetimibe (ZETIA) 10 MG tablet Take 10 mg by mouth at bedtime.   . ibandronate (BONIVA) 150 MG tablet Take 150 mg by mouth every 30 (thirty) days. Take in the morning with a full glass of water, on an empty stomach, and do not take anything else by mouth or lie down for the next 30 min.  Marland Kitchen ketoconazole (NIZORAL) 2 % shampoo Apply 1 application topically 3 (three) times a week. Apply to scalp after shampooing  . levETIRAcetam (KEPPRA) 1000 MG tablet Take 1 tablet (1,000 mg total) by mouth 2 (two) times daily.  Marland Kitchen levothyroxine (SYNTHROID, LEVOTHROID) 50 MCG tablet Take 50 mcg by mouth daily before breakfast.  . lisinopril (PRINIVIL,ZESTRIL) 10 MG tablet Take 10 mg by mouth daily.  Marland Kitchen LORazepam (ATIVAN) 1 MG tablet TAKE 1 TABLET AS NEEDED FOR SEIZURE. DO NOT TAKE MORE THAN 2 IN 24 HRS.  . Multiple Vitamins-Minerals (MULTIVITAMIN WITH MINERALS) tablet Take 1 tablet by mouth daily.  . Multiple Vitamins-Minerals (OCUVITE PO) Take 1 tablet by mouth daily.  . Omega-3 Fatty Acids (FISH OIL) 1000 MG CAPS Take 1,000 mg by mouth daily.   . pramipexole (MIRAPEX) 0.125 MG tablet Take  0.375 mg by mouth at bedtime.   . vitamin E 400 UNIT capsule Take 400 Units by mouth daily.   No facility-administered encounter medications on file as of 12/25/2017.      Current Examination:  Behavioral Observations:  Appearance: Neatly and appropriately dressed and groomed Gait: Ambulated with a walker Speech: Fluent; normal rate, rhythm and volume. No significant word finding difficulty during conversational speech. Thought process: Linear, goal directed Affect: Mildly blunted, stable Interpersonal: Pleasant, appropriate Orientation: Oriented to person, place and most aspects of time (did not know the current date). Accurately named the current President but could not recall his predecessor.  Tests Administered: . Test of Premorbid Functioning (TOPF) . Wechsler Adult Intelligence Scale-Fourth Edition (WAIS-IV): Similarities and Digit Span subtests . Repeatable Battery for the Assessment of Neuropsychological Status (RBANS) Form A:   . Controlled Oral Word Association Test (COWAT) . Trail Making Test A and B . Clock drawing test . Geriatric Depression Scale  - 15 item screener (GDS-15) . Generalized Anxiety Disorder -  7 item screener (GAD-7)  Test Results: Note: Standardized scores are presented only for use by appropriately trained professionals and to allow for any future test-retest comparison. These scores should not be interpreted without consideration of all the information that is contained in the rest of the report. The most recent standardization samples from the test publisher or other sources were used whenever possible to derive standard scores; scores were corrected for age, gender, ethnicity and education when available.   Test Scores:  Test Name Raw Score Standardized Score Descriptor  TOPF 50/70 SS= 108 Average  WAIS-IV Subtests     Similarities 26/36 ss= 12 High average  Digit Span Forward 9/16 ss= 9 Average  Digit Span Backward 8/16 ss= 11 Average  RBANS  Index Scores     Immediate Memory  SS= 90 Average  Visuospatial/Constructional  SS= 109 Average  Language  SS= 95 Average  Attention  SS= 106 Average  Delayed Memory  SS= 78 Borderline  Total Scale  SS= 93 Average  COWAT-FAS 33 T= 46 Average  COWAT-Animals 14 T= 45 Average  Trail Making Test A  37" 0 errors T= 59 High average  Trail Making Test B  114" 1 error T= 55 Average  Clock Drawing   WNL  GDS-15 1/15  WNL  GAD-7 0/21  WNL      Description of Test Results:  Premorbid verbal intellectual abilities were estimated to have been within the average range based on a test of word reading. Psychomotor processing speed was average. Auditory attention and working memory were average. Visual-spatial construction was intact on her drawn copy of a complex geometric figure, and visual-spatial perception was average on a line orientation task. Language abilities were intact. Specifically, confrontation naming was high average, and semantic verbal fluency was low average to average. With regard to verbal memory, encoding and acquisition of non-contextual information (i.e., word list) was low average across four learning trials. After a delay, free recall was low average (1/10 items recalled). Performance on a yes/no recognition task was within normal limits (she did not recall 2/10 target items but had no false positive errors). On another verbal memory test, encoding and acquisition of contextual auditory information (i.e., short stories) was average. After a delay, free recall was low average. With regard to non-verbal memory, delayed free recall of visual information was borderline impaired. Executive functioning was normal for age on the tasks administered. Mental flexibility and set-shifting were average on Trails B. Verbal fluency with phonemic search restrictions was high average. Verbal abstract reasoning was average. Performance on a clock drawing task was intact. On self-report measures of  mood, the patient's responses were not indicative of clinically significant depression or anxiety at the present time.    Clinical Impressions: Amnestic Mild Cognitive Impairment. Results of cognitive testing were largely within normal limits for age, but she did demonstrate a focal relative decline in retrieval of newly learned information. She was not entirely amnestic for newly learned information, and recognition memory was within normal limits, but delayed free recall of both verbal and non-verbal information was slightly below expectation. Based on her current test results and her current level of functioning in daily life, diagnostic criteria for dementia are NOT met. 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 Etiology of MCI remains unclear. Left temporal lobe seizures and/or underlying neuropathology causing the seizures are among the differentials. I cannot entirely rule out prodromal Alzheimer's disease at this time. Fortunately, she presents with many cognitive strengths and there  is no sign of mood disorder, emotional distress or primary psychiatric disorder.    Recommendations/Plan: Based on the findings of the present evaluation, the following recommendations are offered:  1. From a cognitive/neuropsychological perspective, I do not have concerns about her ability to drive. However, given her recurrent seizures she of course is medically advised that it is unsafe for her to operate a motor vehicle. She is not driving at the present time.  2. For her memory deficit, it appears that the patient will benefit from external memory aids and cues to help her recall important information, especially newly encoded information. She appears to make good use of such aids/cues.  3. Her family will benefit from education and support regarding MCI. I provided some written information and also referred them to the Alzheimer's Association which provides information about MCI (not just  AD).  4. The patient will continue to follow up with Dr. Delice Lesch for neurological care. 5. Neuropsychological re-evaluation in 12-18 months is recommended in order to monitor cognitive status, track any progression of cognitive changes, and further assist with treatment planning.    Feedback to Patient: Renee Zuniga and her husband and their two daughters returned for a feedback appointment on 12/25/2017 to review the results of her neuropsychological evaluation with this provider. 40 minutes face-to-face time was spent reviewing her test results, my impressions and my recommendations as detailed above.    Total time spent on this patient's case: [id number] unit for interview with psychologist; [phone] units of testing by psychometrician under psychologist's supervision; [phone] and [phone] units for integration of patient data, interpretation of standardized test results and clinical data, clinical decision making, treatment planning and preparation of this report, and interactive feedback with review of results to the patient/family by psychologist.      Thank you for your referral of Renee Zuniga. Please feel free to contact me if you have any questions or concerns regarding this report.

## 2017-12-25 ENCOUNTER — Ambulatory Visit (INDEPENDENT_AMBULATORY_CARE_PROVIDER_SITE_OTHER): Payer: Medicare Other | Admitting: Psychology

## 2017-12-25 ENCOUNTER — Encounter: Payer: Self-pay | Admitting: Psychology

## 2017-12-25 DIAGNOSIS — G40009 Localization-related (focal) (partial) idiopathic epilepsy and epileptic syndromes with seizures of localized onset, not intractable, without status epilepticus: Secondary | ICD-10-CM | POA: Diagnosis not present

## 2017-12-25 DIAGNOSIS — M170 Bilateral primary osteoarthritis of knee: Secondary | ICD-10-CM | POA: Diagnosis not present

## 2017-12-25 DIAGNOSIS — G3184 Mild cognitive impairment, so stated: Secondary | ICD-10-CM

## 2017-12-25 DIAGNOSIS — R413 Other amnesia: Secondary | ICD-10-CM

## 2017-12-25 NOTE — Patient Instructions (Signed)
Results of cognitive testing were largely within normal limits for age. Attention, processing speed, visual spatial skills, language abilities, and executive functioning were all normal on testing. Meanwhile, there was mild impairment in memory retrieval of newly learned information.   Based on her current test results and her current level of functioning in daily life, diagnostic criteria for dementia are NOT met. Instead, a diagnosis of Mild cognitive impairment is warranted.   The cause of memory impairment remains unclear. Left temporal lobe seizures and/or underlying neuropathology causing the seizures are among the differentials.     Recommendations/Plan:  1. Driving guidelines have been reviewed by Dr. Delice Lesch. Given recurrent seizures, it is unsafe for the patient to operate a motor vehicle.   2. Continue to use external memory aids and cues such as notes/calendars/reminders to help recall important information, especially new information.   3. Continue follow-up with Dr. Delice Lesch.   4. I have recommended cognitive re-evaluation in 12-18 months is recommended in order to monitor cognitive status and further assist with treatment planning.

## 2017-12-28 DIAGNOSIS — S9001XD Contusion of right ankle, subsequent encounter: Secondary | ICD-10-CM | POA: Diagnosis not present

## 2017-12-28 DIAGNOSIS — M2142 Flat foot [pes planus] (acquired), left foot: Secondary | ICD-10-CM | POA: Diagnosis not present

## 2017-12-28 DIAGNOSIS — M170 Bilateral primary osteoarthritis of knee: Secondary | ICD-10-CM | POA: Diagnosis not present

## 2017-12-28 DIAGNOSIS — G40009 Localization-related (focal) (partial) idiopathic epilepsy and epileptic syndromes with seizures of localized onset, not intractable, without status epilepticus: Secondary | ICD-10-CM | POA: Diagnosis not present

## 2017-12-28 DIAGNOSIS — G3184 Mild cognitive impairment, so stated: Secondary | ICD-10-CM | POA: Diagnosis not present

## 2017-12-28 DIAGNOSIS — M2141 Flat foot [pes planus] (acquired), right foot: Secondary | ICD-10-CM | POA: Diagnosis not present

## 2017-12-31 ENCOUNTER — Other Ambulatory Visit: Payer: Self-pay | Admitting: Neurology

## 2017-12-31 DIAGNOSIS — M2141 Flat foot [pes planus] (acquired), right foot: Secondary | ICD-10-CM | POA: Diagnosis not present

## 2017-12-31 DIAGNOSIS — M170 Bilateral primary osteoarthritis of knee: Secondary | ICD-10-CM | POA: Diagnosis not present

## 2017-12-31 DIAGNOSIS — G40009 Localization-related (focal) (partial) idiopathic epilepsy and epileptic syndromes with seizures of localized onset, not intractable, without status epilepticus: Secondary | ICD-10-CM

## 2017-12-31 DIAGNOSIS — S9001XD Contusion of right ankle, subsequent encounter: Secondary | ICD-10-CM | POA: Diagnosis not present

## 2017-12-31 DIAGNOSIS — G3184 Mild cognitive impairment, so stated: Secondary | ICD-10-CM | POA: Diagnosis not present

## 2017-12-31 DIAGNOSIS — M2142 Flat foot [pes planus] (acquired), left foot: Secondary | ICD-10-CM | POA: Diagnosis not present

## 2018-01-03 DIAGNOSIS — G3184 Mild cognitive impairment, so stated: Secondary | ICD-10-CM | POA: Diagnosis not present

## 2018-01-03 DIAGNOSIS — G40009 Localization-related (focal) (partial) idiopathic epilepsy and epileptic syndromes with seizures of localized onset, not intractable, without status epilepticus: Secondary | ICD-10-CM | POA: Diagnosis not present

## 2018-01-03 DIAGNOSIS — M2142 Flat foot [pes planus] (acquired), left foot: Secondary | ICD-10-CM | POA: Diagnosis not present

## 2018-01-03 DIAGNOSIS — M170 Bilateral primary osteoarthritis of knee: Secondary | ICD-10-CM | POA: Diagnosis not present

## 2018-01-03 DIAGNOSIS — M2141 Flat foot [pes planus] (acquired), right foot: Secondary | ICD-10-CM | POA: Diagnosis not present

## 2018-01-03 DIAGNOSIS — S9001XD Contusion of right ankle, subsequent encounter: Secondary | ICD-10-CM | POA: Diagnosis not present

## 2018-01-07 DIAGNOSIS — E7800 Pure hypercholesterolemia, unspecified: Secondary | ICD-10-CM | POA: Diagnosis not present

## 2018-01-07 DIAGNOSIS — G40909 Epilepsy, unspecified, not intractable, without status epilepticus: Secondary | ICD-10-CM | POA: Diagnosis not present

## 2018-01-07 DIAGNOSIS — R4189 Other symptoms and signs involving cognitive functions and awareness: Secondary | ICD-10-CM | POA: Diagnosis not present

## 2018-01-07 DIAGNOSIS — N183 Chronic kidney disease, stage 3 (moderate): Secondary | ICD-10-CM | POA: Diagnosis not present

## 2018-01-07 DIAGNOSIS — E039 Hypothyroidism, unspecified: Secondary | ICD-10-CM | POA: Diagnosis not present

## 2018-01-07 DIAGNOSIS — I10 Essential (primary) hypertension: Secondary | ICD-10-CM | POA: Diagnosis not present

## 2018-01-11 ENCOUNTER — Telehealth: Payer: Self-pay | Admitting: Psychology

## 2018-01-11 NOTE — Telephone Encounter (Signed)
Patient husband wants to know about the memory clinic. He states that they have not heard anything from anyone about appt please call

## 2018-01-14 NOTE — Telephone Encounter (Signed)
I believe the patient is referring to a conversation we had at his wife's follow-up visit. He informed me at that time that they have a family friend who is attending a "memory clinic" at [facility] on a regular (weekly?) basis, and he wanted to know more about that and if his wife could attend. At the time, I informed him that there is no formal "memory clinic" within [facility] at this time.  I called patient's husband back this morning and left a voicemail for him, informing him (again) that there is not a formal "memory clinic" within [facility] at this time, but that it is possible his friend was referring to ongoing clinical research studies at [facility]. I provided the phone number for their practice in case he wants to contact them for more information.

## 2018-02-05 DIAGNOSIS — N3001 Acute cystitis with hematuria: Secondary | ICD-10-CM | POA: Diagnosis not present

## 2018-02-12 ENCOUNTER — Ambulatory Visit: Payer: Medicare Other | Admitting: Neurology

## 2018-02-13 NOTE — Telephone Encounter (Signed)
Error

## 2018-02-20 ENCOUNTER — Other Ambulatory Visit: Payer: Self-pay | Admitting: Neurology

## 2018-02-20 DIAGNOSIS — G40009 Localization-related (focal) (partial) idiopathic epilepsy and epileptic syndromes with seizures of localized onset, not intractable, without status epilepticus: Secondary | ICD-10-CM

## 2018-02-25 DIAGNOSIS — G40909 Epilepsy, unspecified, not intractable, without status epilepticus: Secondary | ICD-10-CM | POA: Diagnosis not present

## 2018-02-25 DIAGNOSIS — R413 Other amnesia: Secondary | ICD-10-CM | POA: Diagnosis not present

## 2018-03-21 DIAGNOSIS — M170 Bilateral primary osteoarthritis of knee: Secondary | ICD-10-CM | POA: Diagnosis not present

## 2018-04-03 ENCOUNTER — Other Ambulatory Visit: Payer: Self-pay | Admitting: Internal Medicine

## 2018-04-03 DIAGNOSIS — Z1231 Encounter for screening mammogram for malignant neoplasm of breast: Secondary | ICD-10-CM

## 2018-04-05 ENCOUNTER — Ambulatory Visit (INDEPENDENT_AMBULATORY_CARE_PROVIDER_SITE_OTHER): Payer: Medicare Other | Admitting: Neurology

## 2018-04-05 ENCOUNTER — Other Ambulatory Visit: Payer: Self-pay | Admitting: Neurology

## 2018-04-05 ENCOUNTER — Other Ambulatory Visit: Payer: Self-pay

## 2018-04-05 ENCOUNTER — Encounter: Payer: Self-pay | Admitting: Neurology

## 2018-04-05 VITALS — BP 140/86 | HR 89 | Ht 64.0 in | Wt 160.0 lb

## 2018-04-05 DIAGNOSIS — G40009 Localization-related (focal) (partial) idiopathic epilepsy and epileptic syndromes with seizures of localized onset, not intractable, without status epilepticus: Secondary | ICD-10-CM

## 2018-04-05 DIAGNOSIS — G3184 Mild cognitive impairment, so stated: Secondary | ICD-10-CM

## 2018-04-05 MED ORDER — LEVETIRACETAM 1000 MG PO TABS: ORAL_TABLET | ORAL | 6 refills | Status: DC

## 2018-04-05 MED ORDER — LEVETIRACETAM 750 MG PO TABS: ORAL_TABLET | ORAL | 11 refills | Status: DC

## 2018-04-05 NOTE — Patient Instructions (Addendum)
1. Increase Keppra (Levetiracetam) to 1500mg  twice a day. Your new prescription will be for Levetiracetam 750mg : Take 2 tablets twice a day 2. Continue with all your other medications 3. As per [place] driving laws, no driving until 6 months seizure-free. We will need to do a driving evaluation after being 6 months seizure-free. 4. Follow-up in 6 months, call for any changes  Seizure Precautions: 1. If medication has been prescribed for you to prevent seizures, take it exactly as directed.  Do not stop taking the medicine without talking to your doctor first, even if you have not had a seizure in a long time.   2. Avoid activities in which a seizure would cause danger to yourself or to others.  Don't operate dangerous machinery, swim alone, or climb in high or dangerous places, such as on ladders, roofs, or girders.  Do not drive unless your doctor says you may.  3. If you have any warning that you may have a seizure, lay down in a safe place where you can't hurt yourself.    4.  No driving for 6 months from last seizure, as per [facility].   Please refer to the following link on the [place] website for more information: http://www.epilepsyfoundation.org/answerplace/Social/driving/drivingu.cfm   5.  Maintain good sleep hygiene. Minimize alcohol.  6.  Contact your doctor if you have any problems that may be related to the medicine you are taking.  7.  Call 911 and bring the patient back to the ED if:        A.  The seizure lasts longer than 5 minutes.       B.  The patient doesn't awaken shortly after the seizure  C.  The patient has new problems such as difficulty seeing, speaking or moving  D.  The patient was injured during the seizure  E.  The patient has a temperature over 102 F (39C)  F.  The patient vomited and now is having trouble breathing
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
## 2018-04-16 DIAGNOSIS — H524 Presbyopia: Secondary | ICD-10-CM | POA: Diagnosis not present

## 2018-04-16 DIAGNOSIS — H2513 Age-related nuclear cataract, bilateral: Secondary | ICD-10-CM | POA: Diagnosis not present

## 2018-05-05 DIAGNOSIS — H1011 Acute atopic conjunctivitis, right eye: Secondary | ICD-10-CM | POA: Diagnosis not present

## 2018-05-08 DIAGNOSIS — H02401 Unspecified ptosis of right eyelid: Secondary | ICD-10-CM | POA: Diagnosis not present

## 2018-05-08 DIAGNOSIS — H5711 Ocular pain, right eye: Secondary | ICD-10-CM | POA: Diagnosis not present

## 2018-05-08 DIAGNOSIS — S0501XA Injury of conjunctiva and corneal abrasion without foreign body, right eye, initial encounter: Secondary | ICD-10-CM | POA: Diagnosis not present

## 2018-05-15 DIAGNOSIS — M1990 Unspecified osteoarthritis, unspecified site: Secondary | ICD-10-CM | POA: Diagnosis not present

## 2018-05-15 DIAGNOSIS — E785 Hyperlipidemia, unspecified: Secondary | ICD-10-CM | POA: Diagnosis not present

## 2018-05-15 DIAGNOSIS — E039 Hypothyroidism, unspecified: Secondary | ICD-10-CM | POA: Diagnosis not present

## 2018-05-15 DIAGNOSIS — M810 Age-related osteoporosis without current pathological fracture: Secondary | ICD-10-CM | POA: Diagnosis not present

## 2018-05-15 DIAGNOSIS — I10 Essential (primary) hypertension: Secondary | ICD-10-CM | POA: Diagnosis not present

## 2018-05-15 DIAGNOSIS — N183 Chronic kidney disease, stage 3 (moderate): Secondary | ICD-10-CM | POA: Diagnosis not present

## 2018-05-23 ENCOUNTER — Ambulatory Visit: Payer: Medicare Other

## 2018-06-03 ENCOUNTER — Ambulatory Visit: Payer: Medicare Other

## 2018-06-04 DIAGNOSIS — J069 Acute upper respiratory infection, unspecified: Secondary | ICD-10-CM | POA: Diagnosis not present

## 2018-06-04 DIAGNOSIS — R0982 Postnasal drip: Secondary | ICD-10-CM | POA: Diagnosis not present

## 2018-06-12 ENCOUNTER — Other Ambulatory Visit: Payer: Self-pay | Admitting: Neurology

## 2018-06-12 DIAGNOSIS — G40009 Localization-related (focal) (partial) idiopathic epilepsy and epileptic syndromes with seizures of localized onset, not intractable, without status epilepticus: Secondary | ICD-10-CM

## 2018-06-12 MED ORDER — LORAZEPAM 1 MG PO TABS: ORAL_TABLET | ORAL | 0 refills | Status: DC

## 2018-06-12 MED ORDER — LEVETIRACETAM 750 MG PO TABS: ORAL_TABLET | ORAL | 0 refills | Status: DC

## 2018-06-12 NOTE — Telephone Encounter (Signed)
rx's sent to Dr. Delice Lesch for approval

## 2018-06-17 ENCOUNTER — Ambulatory Visit: Payer: Medicare Other

## 2018-06-17 DIAGNOSIS — R0981 Nasal congestion: Secondary | ICD-10-CM | POA: Diagnosis not present

## 2018-06-17 DIAGNOSIS — R350 Frequency of micturition: Secondary | ICD-10-CM | POA: Diagnosis not present

## 2018-06-25 DIAGNOSIS — L820 Inflamed seborrheic keratosis: Secondary | ICD-10-CM | POA: Diagnosis not present

## 2018-06-25 DIAGNOSIS — L570 Actinic keratosis: Secondary | ICD-10-CM | POA: Diagnosis not present

## 2018-06-25 DIAGNOSIS — M170 Bilateral primary osteoarthritis of knee: Secondary | ICD-10-CM | POA: Diagnosis not present

## 2018-06-28 ENCOUNTER — Telehealth: Payer: Self-pay | Admitting: Neurology

## 2018-06-28 NOTE — Telephone Encounter (Signed)
Spoke with pt's daughter, Shacora Zynda, who states that pt is "just not feeling like herself" on and off since 4th of July.  Advised that she reach out to Dr. Delfina Redwood and at least keep him abreast of what is going on.  Let daughter know that at this time, Dr. Delice Lesch does not have any appointments open for next week.  abryanna musolino states that she will keep an eye on pt over the weekend "just in case it's just a bug or something" and will call Dr. Lina Sar office on Monday.

## 2018-06-28 NOTE — Telephone Encounter (Signed)
Mischele Detter Daughter [phone]  Wyllow Seigler called to say that off and on since July 4th her mother has not been feeling like herself and does not want to go to emergency room, but she feels like she may need to be seen next week for an office visit. Please call Flor Whitacre back or Elenore Rota [phone] and advise.

## 2018-07-04 DIAGNOSIS — R252 Cramp and spasm: Secondary | ICD-10-CM | POA: Diagnosis not present

## 2018-07-04 DIAGNOSIS — G40909 Epilepsy, unspecified, not intractable, without status epilepticus: Secondary | ICD-10-CM | POA: Diagnosis not present

## 2018-07-11 ENCOUNTER — Ambulatory Visit
Admission: RE | Admit: 2018-07-11 | Discharge: 2018-07-11 | Disposition: A | Discharge status: Home / Self Care | Payer: Medicare Other | Source: Ambulatory Visit | Attending: Internal Medicine | Admitting: Internal Medicine

## 2018-07-11 DIAGNOSIS — Z1231 Encounter for screening mammogram for malignant neoplasm of breast: Secondary | ICD-10-CM

## 2018-07-22 ENCOUNTER — Other Ambulatory Visit: Payer: Self-pay | Admitting: Neurology

## 2018-07-22 DIAGNOSIS — G40009 Localization-related (focal) (partial) idiopathic epilepsy and epileptic syndromes with seizures of localized onset, not intractable, without status epilepticus: Secondary | ICD-10-CM

## 2018-07-24 DIAGNOSIS — K521 Toxic gastroenteritis and colitis: Secondary | ICD-10-CM | POA: Diagnosis not present

## 2018-07-24 DIAGNOSIS — L578 Other skin changes due to chronic exposure to nonionizing radiation: Secondary | ICD-10-CM | POA: Diagnosis not present

## 2018-07-24 DIAGNOSIS — L853 Xerosis cutis: Secondary | ICD-10-CM | POA: Diagnosis not present

## 2018-09-20 DIAGNOSIS — Z23 Encounter for immunization: Secondary | ICD-10-CM | POA: Diagnosis not present

## 2018-09-24 DIAGNOSIS — L570 Actinic keratosis: Secondary | ICD-10-CM | POA: Diagnosis not present

## 2018-09-24 DIAGNOSIS — L821 Other seborrheic keratosis: Secondary | ICD-10-CM | POA: Diagnosis not present

## 2018-09-24 DIAGNOSIS — L820 Inflamed seborrheic keratosis: Secondary | ICD-10-CM | POA: Diagnosis not present

## 2018-10-14 DIAGNOSIS — H903 Sensorineural hearing loss, bilateral: Secondary | ICD-10-CM | POA: Diagnosis not present

## 2018-10-14 DIAGNOSIS — H6123 Impacted cerumen, bilateral: Secondary | ICD-10-CM | POA: Diagnosis not present

## 2018-10-15 ENCOUNTER — Ambulatory Visit (INDEPENDENT_AMBULATORY_CARE_PROVIDER_SITE_OTHER): Payer: Medicare Other | Admitting: Neurology

## 2018-10-15 ENCOUNTER — Encounter: Payer: Self-pay | Admitting: Neurology

## 2018-10-15 VITALS — BP 110/70 | HR 83 | Ht 64.0 in | Wt 157.4 lb

## 2018-10-15 DIAGNOSIS — G3184 Mild cognitive impairment, so stated: Secondary | ICD-10-CM

## 2018-10-15 DIAGNOSIS — G40009 Localization-related (focal) (partial) idiopathic epilepsy and epileptic syndromes with seizures of localized onset, not intractable, without status epilepticus: Secondary | ICD-10-CM

## 2018-10-15 MED ORDER — DONEPEZIL HCL 10 MG PO TABS: ORAL_TABLET | ORAL | 11 refills | Status: DC

## 2018-10-15 MED ORDER — LEVETIRACETAM 750 MG PO TABS: ORAL_TABLET | ORAL | 11 refills | Status: DC

## 2018-10-15 NOTE — Patient Instructions (Addendum)
1. Start Donepezil 10mg : Take 1/2 tablet daily for 2 weeks, then increase to 1 tablet daily 2. Continue Keppra 750mg : take 2 tablets twice a day 3. Follow-up in 1 year, call for any changes  FALL PRECAUTIONS: Be cautious when walking. Scan the area for obstacles that may increase the risk of trips and falls. When getting up in the mornings, sit up at the edge of the bed for a few minutes before getting out of bed. Consider elevating the bed at the head end to avoid drop of blood pressure when getting up. Walk always in a well-lit room (use night lights in the walls). Avoid area rugs or power cords from appliances in the middle of the walkways. Use a walker or a cane if necessary and consider physical therapy for balance exercise. Get your eyesight checked regularly.  FINANCIAL OVERSIGHT: Supervision, especially oversight when making financial decisions or transactions is also recommended.  HOME SAFETY: Consider the safety of the kitchen when operating appliances like stoves, microwave oven, and blender. Consider having supervision and share cooking responsibilities until no longer able to participate in those. Accidents with firearms and other hazards in the house should be identified and addressed as well.  ABILITY TO BE LEFT ALONE: If patient is unable to contact 911 operator, consider using LifeLine, or when the need is there, arrange for someone to stay with patients. Smoking is a fire hazard, consider supervision or cessation. Risk of wandering should be assessed by caregiver and if detected at any point, supervision and safe proof recommendations should be instituted.  MEDICATION SUPERVISION: Inability to self-administer medication needs to be constantly addressed. Implement a mechanism to ensure safe administration of the medications.  RECOMMENDATIONS FOR ALL PATIENTS WITH MEMORY PROBLEMS: 1. Continue to exercise (Recommend 30 minutes of walking everyday, or 3 hours every week) 2. Increase  social interactions - continue going to [place] and enjoy social gatherings with friends and family 3. Eat healthy, avoid fried foods and eat more fruits and vegetables 4. Maintain adequate blood pressure, blood sugar, and blood cholesterol level. Reducing the risk of stroke and cardiovascular disease also helps promoting better memory. 5. Avoid stressful situations. Live a simple life and avoid aggravations. Organize your time and prepare for the next day in anticipation. 6. Sleep well, avoid any interruptions of sleep and avoid any distractions in the bedroom that may interfere with adequate sleep quality 7. Avoid sugar, avoid sweets as there is a strong link between excessive sugar intake, diabetes, and cognitive impairment The Mediterranean diet has been shown to help patients reduce the risk of progressive memory disorders and reduces cardiovascular risk. This includes eating fish, eat fruits and green leafy vegetables, nuts like almonds and hazelnuts, walnuts, and also use olive oil. Avoid fast foods and fried foods as much as possible. Avoid sweets and sugar as sugar use has been linked to worsening of memory function.  There is always a concern of gradual progression of memory problems. If this is the case, then we may need to adjust level of care according to patient needs. Support, both to the patient and caregiver, should then be put into place.    Seizure Precautions: 1. If medication has been prescribed for you to prevent seizures, take it exactly as directed.  Do not stop taking the medicine without talking to your doctor first, even if you have not had a seizure in a long time.   2. Avoid activities in which a seizure would cause danger to yourself or  to others.  Don't operate dangerous machinery, swim alone, or climb in high or dangerous places, such as on ladders, roofs, or girders.  Do not drive unless your doctor says you may.  3. If you have any warning that you may have a  seizure, lay down in a safe place where you can't hurt yourself.    4.  No driving for 6 months from last seizure, as per [facility].   Please refer to the following link on the [place] website for more information: http://www.epilepsyfoundation.org/answerplace/Social/driving/drivingu.cfm   5.  Maintain good sleep hygiene. Avoid alcohol.  6.  Contact your doctor if you have any problems that may be related to the medicine you are taking.  7.  Call 911 and bring the patient back to the ED if:        A.  The seizure lasts longer than 5 minutes.       B.  The patient doesn't awaken shortly after the seizure  C.  The patient has new problems such as difficulty seeing, speaking or moving  D.  The patient was injured during the seizure  E.  The patient has a temperature over 102 F (39C)  F.  The patient vomited and now is having trouble breathing
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
## 2018-10-18 DIAGNOSIS — R350 Frequency of micturition: Secondary | ICD-10-CM | POA: Diagnosis not present

## 2018-10-29 DIAGNOSIS — L821 Other seborrheic keratosis: Secondary | ICD-10-CM | POA: Diagnosis not present

## 2018-10-29 DIAGNOSIS — L578 Other skin changes due to chronic exposure to nonionizing radiation: Secondary | ICD-10-CM | POA: Diagnosis not present

## 2018-11-01 DIAGNOSIS — H01001 Unspecified blepharitis right upper eyelid: Secondary | ICD-10-CM | POA: Diagnosis not present

## 2018-11-01 DIAGNOSIS — H01004 Unspecified blepharitis left upper eyelid: Secondary | ICD-10-CM | POA: Diagnosis not present

## 2018-11-18 DIAGNOSIS — E039 Hypothyroidism, unspecified: Secondary | ICD-10-CM | POA: Diagnosis not present

## 2018-11-18 DIAGNOSIS — E785 Hyperlipidemia, unspecified: Secondary | ICD-10-CM | POA: Diagnosis not present

## 2018-11-18 DIAGNOSIS — N183 Chronic kidney disease, stage 3 (moderate): Secondary | ICD-10-CM | POA: Diagnosis not present

## 2018-11-18 DIAGNOSIS — I10 Essential (primary) hypertension: Secondary | ICD-10-CM | POA: Diagnosis not present

## 2018-11-18 DIAGNOSIS — M810 Age-related osteoporosis without current pathological fracture: Secondary | ICD-10-CM | POA: Diagnosis not present

## 2018-11-18 DIAGNOSIS — M1990 Unspecified osteoarthritis, unspecified site: Secondary | ICD-10-CM | POA: Diagnosis not present

## 2018-11-22 DIAGNOSIS — M810 Age-related osteoporosis without current pathological fracture: Secondary | ICD-10-CM | POA: Diagnosis not present

## 2018-11-22 DIAGNOSIS — E7800 Pure hypercholesterolemia, unspecified: Secondary | ICD-10-CM | POA: Diagnosis not present

## 2018-11-22 DIAGNOSIS — N183 Chronic kidney disease, stage 3 (moderate): Secondary | ICD-10-CM | POA: Diagnosis not present

## 2018-11-22 DIAGNOSIS — E039 Hypothyroidism, unspecified: Secondary | ICD-10-CM | POA: Diagnosis not present

## 2018-11-22 DIAGNOSIS — R413 Other amnesia: Secondary | ICD-10-CM | POA: Diagnosis not present

## 2018-11-22 DIAGNOSIS — I10 Essential (primary) hypertension: Secondary | ICD-10-CM | POA: Diagnosis not present

## 2018-11-22 DIAGNOSIS — G40909 Epilepsy, unspecified, not intractable, without status epilepticus: Secondary | ICD-10-CM | POA: Diagnosis not present

## 2018-11-22 DIAGNOSIS — S8011XA Contusion of right lower leg, initial encounter: Secondary | ICD-10-CM | POA: Diagnosis not present

## 2018-11-25 ENCOUNTER — Encounter (HOSPITAL_COMMUNITY): Payer: Self-pay | Admitting: *Deleted

## 2018-11-25 ENCOUNTER — Inpatient Hospital Stay (HOSPITAL_COMMUNITY)
Admission: EM | Admit: 2018-11-25 | Discharge: 2018-11-29 | DRG: 536 | Disposition: A | Discharge status: Skilled Nursing Facility | Payer: Medicare Other | Attending: Internal Medicine | Admitting: Internal Medicine

## 2018-11-25 ENCOUNTER — Other Ambulatory Visit: Payer: Self-pay

## 2018-11-25 ENCOUNTER — Emergency Department (HOSPITAL_COMMUNITY): Payer: Medicare Other

## 2018-11-25 DIAGNOSIS — I491 Atrial premature depolarization: Secondary | ICD-10-CM | POA: Diagnosis not present

## 2018-11-25 DIAGNOSIS — I341 Nonrheumatic mitral (valve) prolapse: Secondary | ICD-10-CM | POA: Diagnosis present

## 2018-11-25 DIAGNOSIS — Z888 Allergy status to other drugs, medicaments and biological substances status: Secondary | ICD-10-CM

## 2018-11-25 DIAGNOSIS — R0689 Other abnormalities of breathing: Secondary | ICD-10-CM | POA: Diagnosis not present

## 2018-11-25 DIAGNOSIS — S32511A Fracture of superior rim of right pubis, initial encounter for closed fracture: Principal | ICD-10-CM | POA: Diagnosis present

## 2018-11-25 DIAGNOSIS — G3184 Mild cognitive impairment, so stated: Secondary | ICD-10-CM | POA: Diagnosis present

## 2018-11-25 DIAGNOSIS — I10 Essential (primary) hypertension: Secondary | ICD-10-CM | POA: Diagnosis present

## 2018-11-25 DIAGNOSIS — E039 Hypothyroidism, unspecified: Secondary | ICD-10-CM | POA: Diagnosis present

## 2018-11-25 DIAGNOSIS — G40009 Localization-related (focal) (partial) idiopathic epilepsy and epileptic syndromes with seizures of localized onset, not intractable, without status epilepticus: Secondary | ICD-10-CM

## 2018-11-25 DIAGNOSIS — M1990 Unspecified osteoarthritis, unspecified site: Secondary | ICD-10-CM | POA: Diagnosis present

## 2018-11-25 DIAGNOSIS — Z79899 Other long term (current) drug therapy: Secondary | ICD-10-CM

## 2018-11-25 DIAGNOSIS — S3282XA Multiple fractures of pelvis without disruption of pelvic ring, initial encounter for closed fracture: Secondary | ICD-10-CM | POA: Diagnosis not present

## 2018-11-25 DIAGNOSIS — R064 Hyperventilation: Secondary | ICD-10-CM | POA: Diagnosis not present

## 2018-11-25 DIAGNOSIS — S32591A Other specified fracture of right pubis, initial encounter for closed fracture: Secondary | ICD-10-CM | POA: Diagnosis present

## 2018-11-25 DIAGNOSIS — R569 Unspecified convulsions: Secondary | ICD-10-CM | POA: Diagnosis present

## 2018-11-25 DIAGNOSIS — I499 Cardiac arrhythmia, unspecified: Secondary | ICD-10-CM | POA: Diagnosis not present

## 2018-11-25 DIAGNOSIS — Z79890 Hormone replacement therapy (postmenopausal): Secondary | ICD-10-CM

## 2018-11-25 DIAGNOSIS — R52 Pain, unspecified: Secondary | ICD-10-CM | POA: Diagnosis not present

## 2018-11-25 DIAGNOSIS — S329XXA Fracture of unspecified parts of lumbosacral spine and pelvis, initial encounter for closed fracture: Secondary | ICD-10-CM | POA: Diagnosis present

## 2018-11-25 DIAGNOSIS — Y92009 Unspecified place in unspecified non-institutional (private) residence as the place of occurrence of the external cause: Secondary | ICD-10-CM

## 2018-11-25 DIAGNOSIS — W010XXA Fall on same level from slipping, tripping and stumbling without subsequent striking against object, initial encounter: Secondary | ICD-10-CM | POA: Diagnosis present

## 2018-11-25 DIAGNOSIS — F0390 Unspecified dementia without behavioral disturbance: Secondary | ICD-10-CM | POA: Diagnosis present

## 2018-11-25 DIAGNOSIS — E785 Hyperlipidemia, unspecified: Secondary | ICD-10-CM | POA: Diagnosis not present

## 2018-11-25 DIAGNOSIS — D649 Anemia, unspecified: Secondary | ICD-10-CM | POA: Diagnosis present

## 2018-11-25 DIAGNOSIS — M8580 Other specified disorders of bone density and structure, unspecified site: Secondary | ICD-10-CM | POA: Diagnosis present

## 2018-11-25 DIAGNOSIS — Z885 Allergy status to narcotic agent status: Secondary | ICD-10-CM

## 2018-11-25 LAB — URINALYSIS, ROUTINE W REFLEX MICROSCOPIC
Bilirubin Urine: NEGATIVE
Glucose, UA: NEGATIVE mg/dL
Ketones, ur: NEGATIVE mg/dL
Leukocytes, UA: NEGATIVE
Nitrite: NEGATIVE
Protein, ur: NEGATIVE mg/dL
Specific Gravity, Urine: 1.008 (ref 1.005–1.030)
pH: 5 (ref 5.0–8.0)

## 2018-11-25 MED ORDER — FENTANYL CITRATE (PF) 100 MCG/2ML IJ SOLN
50.0000 ug | INTRAMUSCULAR | Status: AC | PRN
  Administered 2018-11-25: 50 ug via INTRAVENOUS
  Filled 2018-11-25: qty 2

## 2018-11-25 NOTE — ED Provider Notes (Signed)
Emergency Department Provider Note   I have reviewed the triage vital signs and the nursing notes.   HISTORY  Chief Complaint Hip Pain   HPI Renee Zuniga is a 84 y.o. female with multiple medical problems as documented below the presents to the emergency department today with right-sided posterior pelvis pain after a mechanical fall.  Patient has a history of having some difficulty walking where she uses a walker today she was not with it and was trying to close some glass doors when she fell down landing on her right side with severe pain to the point where she could not get up.  EMS called brought here for further evaluation after giving her some fentanyl.  Did not hit her head.  Did not syncopized.  Is not hurting anywhere else.  No other associated symptoms.  The fentanyl did seem to help. No other associated or modifying symptoms.    Past Medical History:  Diagnosis Date  . Arthritis   . Essential hypertension, benign   . Heart murmur   . High blood pressure   . Hypercholesteremia   . Insomnia   . Memory loss   . Migraine headache   . Mitral valve prolapse   . Osteoarthritis   . Osteopenia   . Seizures ([place])   . Tinnitus   . Vitamin D deficiency     Patient Active Problem List   Diagnosis Date Noted  . Pelvis fracture ([place]) 11/26/2018  . Mild cognitive impairment 08/22/2017  . Status epilepticus ([place])   . Aphasia 08/19/2016  . Seizure ([place]) 08/19/2016  . Localization-related idiopathic epilepsy and epileptic syndromes with seizures of localized onset, not intractable, without status epilepticus ([place]) 08/12/2015  . TIA (transient ischemic attack) 11/09/2014  . HTN (hypertension) 11/09/2014  . Bilateral foot pain 03/31/2014  . Pes planus 03/31/2014  . Subluxation of ankle joint 03/31/2014  . Arthritis   . Heart murmur   . Mitral valve prolapse   . Osteoarthritis   . Vitamin D deficiency   . Migraine headache   . Hypercholesteremia   . Tinnitus   .  Memory loss     Past Surgical History:  Procedure Laterality Date  . CHOLECYSTECTOMY    . CORRECTION HAMMER TOE Bilateral   . GALLBLADDER SURGERY    . OOPHORECTOMY     benign tumor, per patient  . PARTIAL HYSTERECTOMY  1974   due to uterine prolaspe    Current Outpatient Rx  . Order #: [id number] Class: Historical Med  . Order #: [id number] Class: Historical Med  . Order #: [id number] Class: Historical Med  . Order #: [id number] Class: Normal  . Order #: [id number] Class: Historical Med  . Order #: [id number] Class: Historical Med  . Order #: [id number] Class: Historical Med  . Order #: [id number] Class: Normal  . Order #: [id number] Class: Historical Med  . Order #: [id number] Class: Historical Med  . Order #: [id number] Class: Print  . Order #: [id number] Class: Historical Med  . Order #: [id number] Class: Historical Med  . Order #: [id number] Class: Historical Med  . Order #: [id number] Class: Historical Med  . Order #: [id number] Class: Historical Med    Allergies Ambien [zolpidem]; Demerol [meperidine]; Lunesta [eszopiclone]; and Tramadol  Family History  Problem Relation Age of Onset  . Mitral valve prolapse Father   . Heart Problems Mother   . Hypertension Mother     Social History Social History   Tobacco Use  . Smoking status: Never Smoker  . Smokeless tobacco: Never Used  Substance Use Topics  .  Alcohol use: Yes    Alcohol/week: 1.0 standard drinks    Types: 1 Glasses of wine per week    Comment: with dinner  . Drug use: No    Review of Systems  All other systems negative except as documented in the HPI. All pertinent positives and negatives as reviewed in the HPI. ____________________________________________   PHYSICAL EXAM:  VITAL SIGNS: ED Triage Vitals  Enc Vitals Group     BP 11/25/18 2300 (!) 141/66     Pulse Rate 11/25/18 2300 91     Resp 11/25/18 2300 (!) 21     Temp --      Temp src --      SpO2 11/25/18 2247 97 %    Constitutional: Alert and oriented. Well  appearing and in no acute distress. Eyes: Conjunctivae are normal. PERRL. EOMI. Head: Atraumatic. Nose: No congestion/rhinnorhea. Mouth/Throat: Mucous membranes are moist.  Oropharynx non-erythematous. Neck: No stridor.  No meningeal signs.   Cardiovascular: Normal rate, regular rhythm. Good peripheral circulation. Grossly normal heart sounds.   Respiratory: Normal respiratory effort.  No retractions. Lungs CTAB. Gastrointestinal: Soft and nontender. No distention.  Musculoskeletal: pain to palpation in posterior gluteal area. Pain with ROM of right hip as well.  Neurologic:  Normal speech and language. No gross focal neurologic deficits are appreciated.  Skin:  Skin is warm, dry and intact. No rash noted.  ____________________________________________   LABS (all labs ordered are listed, but only abnormal results are displayed)  Labs Reviewed  CBC WITH DIFFERENTIAL/PLATELET - Abnormal; Notable for the following components:      Result Value   RBC 3.70 (*)    Hemoglobin 11.5 (*)    Abs Immature Granulocytes 0.08 (*)    All other components within normal limits  COMPREHENSIVE METABOLIC PANEL - Abnormal; Notable for the following components:   CO2 17 (*)    Total Protein 6.0 (*)    All other components within normal limits  URINALYSIS, ROUTINE W REFLEX MICROSCOPIC - Abnormal; Notable for the following components:   Color, Urine STRAW (*)    Hgb urine dipstick MODERATE (*)    Bacteria, UA RARE (*)    All other components within normal limits   ____________________________________________  EKG   EKG Interpretation  Date/Time:  Monday November 25 2018 22:59:20 EST Ventricular Rate:  93 PR Interval:    QRS Duration: 88 QT Interval:  385 QTC Calculation: 479 R Axis:   43 Text Interpretation:  Sinus rhythm Paired ventricular premature complexes Nonspecific T abnormalities, lateral leads No significant change since last tracing Confirmed by Merrily Pew [id number]) on 11/26/2018  12:59:01 AM       ____________________________________________  RADIOLOGY  Dg Hip Unilat  With Pelvis 2-3 Views Right  Result Date: 11/26/2018 CLINICAL DATA:  Right hip pain after fall. EXAM: DG HIP (WITH OR WITHOUT PELVIS) 2-3V RIGHT COMPARISON:  None. FINDINGS: Mildly displaced fractures of the right superior and inferior pubic rami. No femur fracture. Hip joint spaces are relatively preserved. The pubic symphysis and sacroiliac joints are intact. Osteopenia. Degenerative changes of the lower lumbar spine. IMPRESSION: 1. Mildly displaced fractures of the right superior and inferior pubic rami. Electronically Signed   By: Titus Dubin M.D.   On: 11/26/2018 00:55    ____________________________________________  INITIAL IMPRESSION / ASSESSMENT AND PLAN / ED COURSE  Pelvic fracture. Pain not controlled with multiple doses of fentanyl/percocet/robaxin. Discussed with Dr. Percell Miller who was on call for her orthopedist and they will consult in AM.  Will admit to [facility].   Slightly low bicarb, dehydration related to leg cramping? Will give bolus prior to admission.   Discussed with medicine, will admit.   Pertinent labs & imaging results that were available during my care of the patient were reviewed by me and considered in my medical decision making (see chart for details).  ____________________________________________  FINAL CLINICAL IMPRESSION(S) / ED DIAGNOSES  Final diagnoses:  Multiple closed fractures of pelvis without disruption of pelvic ring, initial encounter ([place])     MEDICATIONS GIVEN DURING THIS VISIT:  Medications  lactated ringers bolus 1,000 mL (1,000 mLs Intravenous New Bag/Given 11/26/18 0311)  fentaNYL (SUBLIMAZE) injection 50 mcg (50 mcg Intravenous Given 11/25/18 2332)  fentaNYL (SUBLIMAZE) injection 100 mcg (100 mcg Intravenous Given 11/26/18 0049)  oxyCODONE-acetaminophen (PERCOCET/ROXICET) 5-325 MG per tablet 2 tablet (2 tablets Oral Given 11/26/18 0119)    methocarbamol (ROBAXIN) tablet 500 mg (500 mg Oral Given 11/26/18 0145)  HYDROmorphone (DILAUDID) injection 0.5 mg (0.5 mg Intravenous Given 11/26/18 0310)     NEW OUTPATIENT MEDICATIONS STARTED DURING THIS VISIT:  New Prescriptions   No medications on file    Note:  This note was prepared with assistance of Dragon voice recognition software. Occasional wrong-word or sound-a-like substitutions may have occurred due to the inherent limitations of voice recognition software.   Honey Zakarian, Corene Cornea, MD 11/26/18 [phone]

## 2018-11-25 NOTE — ED Triage Notes (Signed)
Pt from home was walking in her bedroom and fell, unsure how or why she fell. Per EMS, there was a change from carpet to hard flooring that could have cause pt to fall. Pt c/o R upper femur pain, denies R hip pain. 223mcg Fentanyl given enroute

## 2018-11-26 ENCOUNTER — Other Ambulatory Visit: Payer: Self-pay

## 2018-11-26 ENCOUNTER — Encounter (HOSPITAL_COMMUNITY): Payer: Self-pay | Admitting: Internal Medicine

## 2018-11-26 DIAGNOSIS — Z9181 History of falling: Secondary | ICD-10-CM | POA: Diagnosis not present

## 2018-11-26 DIAGNOSIS — R569 Unspecified convulsions: Secondary | ICD-10-CM

## 2018-11-26 DIAGNOSIS — E039 Hypothyroidism, unspecified: Secondary | ICD-10-CM | POA: Diagnosis present

## 2018-11-26 DIAGNOSIS — Z885 Allergy status to narcotic agent status: Secondary | ICD-10-CM | POA: Diagnosis not present

## 2018-11-26 DIAGNOSIS — M1991 Primary osteoarthritis, unspecified site: Secondary | ICD-10-CM | POA: Diagnosis not present

## 2018-11-26 DIAGNOSIS — Y92009 Unspecified place in unspecified non-institutional (private) residence as the place of occurrence of the external cause: Secondary | ICD-10-CM | POA: Diagnosis not present

## 2018-11-26 DIAGNOSIS — M8580 Other specified disorders of bone density and structure, unspecified site: Secondary | ICD-10-CM | POA: Diagnosis present

## 2018-11-26 DIAGNOSIS — S32511A Fracture of superior rim of right pubis, initial encounter for closed fracture: Secondary | ICD-10-CM | POA: Diagnosis not present

## 2018-11-26 DIAGNOSIS — F0390 Unspecified dementia without behavioral disturbance: Secondary | ICD-10-CM | POA: Diagnosis present

## 2018-11-26 DIAGNOSIS — Z888 Allergy status to other drugs, medicaments and biological substances status: Secondary | ICD-10-CM | POA: Diagnosis not present

## 2018-11-26 DIAGNOSIS — G3184 Mild cognitive impairment, so stated: Secondary | ICD-10-CM

## 2018-11-26 DIAGNOSIS — W010XXA Fall on same level from slipping, tripping and stumbling without subsequent striking against object, initial encounter: Secondary | ICD-10-CM | POA: Diagnosis present

## 2018-11-26 DIAGNOSIS — M1990 Unspecified osteoarthritis, unspecified site: Secondary | ICD-10-CM | POA: Diagnosis present

## 2018-11-26 DIAGNOSIS — S3289XD Fracture of other parts of pelvis, subsequent encounter for fracture with routine healing: Secondary | ICD-10-CM | POA: Diagnosis not present

## 2018-11-26 DIAGNOSIS — G40009 Localization-related (focal) (partial) idiopathic epilepsy and epileptic syndromes with seizures of localized onset, not intractable, without status epilepticus: Secondary | ICD-10-CM | POA: Diagnosis not present

## 2018-11-26 DIAGNOSIS — M6281 Muscle weakness (generalized): Secondary | ICD-10-CM | POA: Diagnosis not present

## 2018-11-26 DIAGNOSIS — S3289XA Fracture of other parts of pelvis, initial encounter for closed fracture: Secondary | ICD-10-CM | POA: Diagnosis not present

## 2018-11-26 DIAGNOSIS — E559 Vitamin D deficiency, unspecified: Secondary | ICD-10-CM | POA: Diagnosis not present

## 2018-11-26 DIAGNOSIS — Z79890 Hormone replacement therapy (postmenopausal): Secondary | ICD-10-CM | POA: Diagnosis not present

## 2018-11-26 DIAGNOSIS — R2689 Other abnormalities of gait and mobility: Secondary | ICD-10-CM | POA: Diagnosis not present

## 2018-11-26 DIAGNOSIS — S329XXA Fracture of unspecified parts of lumbosacral spine and pelvis, initial encounter for closed fracture: Secondary | ICD-10-CM

## 2018-11-26 DIAGNOSIS — G459 Transient cerebral ischemic attack, unspecified: Secondary | ICD-10-CM | POA: Diagnosis not present

## 2018-11-26 DIAGNOSIS — S3282XA Multiple fractures of pelvis without disruption of pelvic ring, initial encounter for closed fracture: Secondary | ICD-10-CM | POA: Diagnosis not present

## 2018-11-26 DIAGNOSIS — I341 Nonrheumatic mitral (valve) prolapse: Secondary | ICD-10-CM

## 2018-11-26 DIAGNOSIS — I10 Essential (primary) hypertension: Secondary | ICD-10-CM

## 2018-11-26 DIAGNOSIS — S3282XD Multiple fractures of pelvis without disruption of pelvic ring, subsequent encounter for fracture with routine healing: Secondary | ICD-10-CM | POA: Diagnosis not present

## 2018-11-26 DIAGNOSIS — D539 Nutritional anemia, unspecified: Secondary | ICD-10-CM | POA: Diagnosis not present

## 2018-11-26 DIAGNOSIS — S329XXS Fracture of unspecified parts of lumbosacral spine and pelvis, sequela: Secondary | ICD-10-CM | POA: Diagnosis not present

## 2018-11-26 DIAGNOSIS — Z79899 Other long term (current) drug therapy: Secondary | ICD-10-CM | POA: Diagnosis not present

## 2018-11-26 DIAGNOSIS — S32591A Other specified fracture of right pubis, initial encounter for closed fracture: Secondary | ICD-10-CM | POA: Diagnosis not present

## 2018-11-26 DIAGNOSIS — D649 Anemia, unspecified: Secondary | ICD-10-CM | POA: Diagnosis present

## 2018-11-26 DIAGNOSIS — E785 Hyperlipidemia, unspecified: Secondary | ICD-10-CM | POA: Diagnosis present

## 2018-11-26 DIAGNOSIS — Z7401 Bed confinement status: Secondary | ICD-10-CM | POA: Diagnosis not present

## 2018-11-26 DIAGNOSIS — R52 Pain, unspecified: Secondary | ICD-10-CM | POA: Diagnosis not present

## 2018-11-26 DIAGNOSIS — S32511D Fracture of superior rim of right pubis, subsequent encounter for fracture with routine healing: Secondary | ICD-10-CM | POA: Diagnosis not present

## 2018-11-26 DIAGNOSIS — M2550 Pain in unspecified joint: Secondary | ICD-10-CM | POA: Diagnosis not present

## 2018-11-26 HISTORY — DX: Fracture of unspecified parts of lumbosacral spine and pelvis, initial encounter for closed fracture: S32.9XXA

## 2018-11-26 LAB — COMPREHENSIVE METABOLIC PANEL
ALT: 27 U/L (ref 0–44)
AST: 35 U/L (ref 15–41)
Albumin: 3.6 g/dL (ref 3.5–5.0)
Alkaline Phosphatase: 47 U/L (ref 38–126)
Anion gap: 13 (ref 5–15)
BUN: 18 mg/dL (ref 8–23)
CO2: 17 mmol/L — ABNORMAL LOW (ref 22–32)
Calcium: 9.2 mg/dL (ref 8.9–10.3)
Chloride: 110 mmol/L (ref 98–111)
Creatinine, Ser: 0.77 mg/dL (ref 0.44–1.00)
GFR calc Af Amer: >60 mL/min (ref >60)
GFR calc non Af Amer: >60 mL/min (ref >60)
Glucose, Bld: 93 mg/dL (ref 70–99)
Potassium: 3.9 mmol/L (ref 3.5–5.1)
Sodium: 140 mmol/L (ref 135–145)
Total Bilirubin: 0.5 mg/dL (ref 0.3–1.2)
Total Protein: 6 g/dL — ABNORMAL LOW (ref 6.5–8.1)

## 2018-11-26 LAB — CBC WITH DIFFERENTIAL/PLATELET
Abs Immature Granulocytes: 0.08 10*3/uL — ABNORMAL HIGH (ref 0.00–0.07)
Basophils Absolute: 0.1 10*3/uL (ref 0.0–0.1)
Basophils Relative: 1 %
Eosinophils Absolute: 0.1 10*3/uL (ref 0.0–0.5)
Eosinophils Relative: 1 %
HCT: 36.2 % (ref 36.0–46.0)
Hemoglobin: 11.5 g/dL — ABNORMAL LOW (ref 12.0–15.0)
Immature Granulocytes: 1 %
Lymphocytes Relative: 14 %
Lymphs Abs: 1.4 10*3/uL (ref 0.7–4.0)
MCH: 31.1 pg (ref 26.0–34.0)
MCHC: 31.8 g/dL (ref 30.0–36.0)
MCV: 97.8 fL (ref 80.0–100.0)
Monocytes Absolute: 0.7 10*3/uL (ref 0.1–1.0)
Monocytes Relative: 7 %
Neutro Abs: 7.5 10*3/uL (ref 1.7–7.7)
Neutrophils Relative %: 76 %
Platelets: 204 10*3/uL (ref 150–400)
RBC: 3.7 MIL/uL — ABNORMAL LOW (ref 3.87–5.11)
RDW: 13.4 % (ref 11.5–15.5)
WBC: 9.7 10*3/uL (ref 4.0–10.5)
nRBC: 0 % (ref 0.0–0.2)

## 2018-11-26 LAB — BASIC METABOLIC PANEL
Anion gap: 10 (ref 5–15)
BUN: 17 mg/dL (ref 8–23)
CO2: 23 mmol/L (ref 22–32)
Calcium: 9.7 mg/dL (ref 8.9–10.3)
Chloride: 105 mmol/L (ref 98–111)
Creatinine, Ser: 0.78 mg/dL (ref 0.44–1.00)
GFR calc Af Amer: >60 mL/min (ref >60)
GFR calc non Af Amer: >60 mL/min (ref >60)
Glucose, Bld: 110 mg/dL — ABNORMAL HIGH (ref 70–99)
Potassium: 4.4 mmol/L (ref 3.5–5.1)
Sodium: 138 mmol/L (ref 135–145)

## 2018-11-26 LAB — CBC
HCT: 35.7 % — ABNORMAL LOW (ref 36.0–46.0)
Hemoglobin: 11.2 g/dL — ABNORMAL LOW (ref 12.0–15.0)
MCH: 30.5 pg (ref 26.0–34.0)
MCHC: 31.4 g/dL (ref 30.0–36.0)
MCV: 97.3 fL (ref 80.0–100.0)
Platelets: 220 10*3/uL (ref 150–400)
RBC: 3.67 MIL/uL — ABNORMAL LOW (ref 3.87–5.11)
RDW: 13.4 % (ref 11.5–15.5)
WBC: 8 10*3/uL (ref 4.0–10.5)
nRBC: 0 % (ref 0.0–0.2)

## 2018-11-26 MED ORDER — PRAMIPEXOLE DIHYDROCHLORIDE 0.25 MG PO TABS
0.3750 mg | ORAL_TABLET | Freq: Every day | ORAL | Status: DC
  Administered 2018-11-26 – 2018-11-28 (×3): 0.375 mg via ORAL
  Filled 2018-11-26 (×3): qty 1

## 2018-11-26 MED ORDER — HYDROCODONE-ACETAMINOPHEN 5-325 MG PO TABS
1.0000 | ORAL_TABLET | ORAL | Status: DC | PRN
  Administered 2018-11-26 – 2018-11-28 (×8): 1 via ORAL
  Filled 2018-11-26 (×8): qty 1

## 2018-11-26 MED ORDER — ONDANSETRON HCL 4 MG/2ML IJ SOLN: 4.0000 mg | Freq: Four times a day (QID) | INTRAMUSCULAR | Status: DC | PRN

## 2018-11-26 MED ORDER — FENTANYL CITRATE (PF) 100 MCG/2ML IJ SOLN
100.0000 ug | Freq: Once | INTRAMUSCULAR | Status: AC
  Administered 2018-11-26: 100 ug via INTRAVENOUS
  Filled 2018-11-26: qty 2

## 2018-11-26 MED ORDER — EZETIMIBE 10 MG PO TABS
10.0000 mg | ORAL_TABLET | Freq: Every day | ORAL | Status: DC
  Administered 2018-11-26 – 2018-11-28 (×3): 10 mg via ORAL
  Filled 2018-11-26 (×3): qty 1

## 2018-11-26 MED ORDER — ADULT MULTIVITAMIN W/MINERALS CH
1.0000 | ORAL_TABLET | Freq: Every day | ORAL | Status: DC
  Administered 2018-11-26 – 2018-11-29 (×4): 1 via ORAL
  Filled 2018-11-26 (×4): qty 1

## 2018-11-26 MED ORDER — OMEGA-3-ACID ETHYL ESTERS 1 G PO CAPS
1.0000 g | ORAL_CAPSULE | Freq: Every day | ORAL | Status: DC
  Administered 2018-11-26 – 2018-11-29 (×4): 1 g via ORAL
  Filled 2018-11-26 (×4): qty 1

## 2018-11-26 MED ORDER — FENTANYL CITRATE (PF) 100 MCG/2ML IJ SOLN
25.0000 ug | INTRAMUSCULAR | Status: DC | PRN
  Administered 2018-11-26 – 2018-11-28 (×12): 25 ug via INTRAVENOUS
  Filled 2018-11-26 (×12): qty 2

## 2018-11-26 MED ORDER — CALCIUM-VITAMIN D 500-200 MG-UNIT PO TABS
1.0000 | ORAL_TABLET | Freq: Two times a day (BID) | ORAL | Status: DC
  Filled 2018-11-26: qty 1

## 2018-11-26 MED ORDER — OXYCODONE-ACETAMINOPHEN 5-325 MG PO TABS
2.0000 | ORAL_TABLET | Freq: Once | ORAL | Status: AC
  Administered 2018-11-26: 2 via ORAL
  Filled 2018-11-26: qty 2

## 2018-11-26 MED ORDER — LISINOPRIL 10 MG PO TABS
10.0000 mg | ORAL_TABLET | Freq: Every day | ORAL | Status: DC
  Administered 2018-11-26 – 2018-11-29 (×4): 10 mg via ORAL
  Filled 2018-11-26 (×4): qty 1

## 2018-11-26 MED ORDER — VITAMIN E 180 MG (400 UNIT) PO CAPS
400.0000 [IU] | ORAL_CAPSULE | Freq: Every day | ORAL | Status: DC
  Administered 2018-11-26 – 2018-11-29 (×4): 400 [IU] via ORAL
  Filled 2018-11-26 (×4): qty 1

## 2018-11-26 MED ORDER — ACETAMINOPHEN 650 MG RE SUPP: 650.0000 mg | Freq: Four times a day (QID) | RECTAL | Status: DC | PRN

## 2018-11-26 MED ORDER — DONEPEZIL HCL 10 MG PO TABS
10.0000 mg | ORAL_TABLET | Freq: Every day | ORAL | Status: DC
  Administered 2018-11-26 – 2018-11-28 (×3): 10 mg via ORAL
  Filled 2018-11-26 (×3): qty 1

## 2018-11-26 MED ORDER — LACTATED RINGERS IV BOLUS
1000.0000 mL | Freq: Once | INTRAVENOUS | Status: AC
  Administered 2018-11-26: 1000 mL via INTRAVENOUS

## 2018-11-26 MED ORDER — CYCLOBENZAPRINE HCL 5 MG PO TABS
5.0000 mg | ORAL_TABLET | Freq: Three times a day (TID) | ORAL | Status: DC | PRN
  Administered 2018-11-26 – 2018-11-29 (×8): 5 mg via ORAL
  Filled 2018-11-26 (×9): qty 1

## 2018-11-26 MED ORDER — METHOCARBAMOL 500 MG PO TABS
500.0000 mg | ORAL_TABLET | Freq: Once | ORAL | Status: AC
  Administered 2018-11-26: 500 mg via ORAL
  Filled 2018-11-26: qty 1

## 2018-11-26 MED ORDER — HYDROMORPHONE HCL 1 MG/ML IJ SOLN
0.5000 mg | Freq: Once | INTRAMUSCULAR | Status: AC
  Administered 2018-11-26: 0.5 mg via INTRAVENOUS
  Filled 2018-11-26: qty 1

## 2018-11-26 MED ORDER — LEVOTHYROXINE SODIUM 50 MCG PO TABS
50.0000 ug | ORAL_TABLET | Freq: Every day | ORAL | Status: DC
  Administered 2018-11-26 – 2018-11-29 (×4): 50 ug via ORAL
  Filled 2018-11-26 (×5): qty 1

## 2018-11-26 MED ORDER — ENOXAPARIN SODIUM 40 MG/0.4ML ~~LOC~~ SOLN
40.0000 mg | SUBCUTANEOUS | Status: DC
  Administered 2018-11-26 – 2018-11-28 (×3): 40 mg via SUBCUTANEOUS
  Filled 2018-11-26 (×4): qty 0.4

## 2018-11-26 MED ORDER — LEVETIRACETAM 750 MG PO TABS
1500.0000 mg | ORAL_TABLET | Freq: Two times a day (BID) | ORAL | Status: DC
  Administered 2018-11-26 – 2018-11-29 (×7): 1500 mg via ORAL
  Filled 2018-11-26 (×7): qty 2

## 2018-11-26 MED ORDER — ACETAMINOPHEN 325 MG PO TABS
650.0000 mg | ORAL_TABLET | Freq: Four times a day (QID) | ORAL | Status: DC | PRN
  Administered 2018-11-26: 650 mg via ORAL
  Filled 2018-11-26: qty 2

## 2018-11-26 MED ORDER — ONDANSETRON HCL 4 MG PO TABS: 4.0000 mg | ORAL_TABLET | Freq: Four times a day (QID) | ORAL | Status: DC | PRN

## 2018-11-26 MED ORDER — CALCIUM CARBONATE-VITAMIN D 500-200 MG-UNIT PO TABS
1.0000 | ORAL_TABLET | Freq: Two times a day (BID) | ORAL | Status: DC
  Administered 2018-11-26 – 2018-11-29 (×7): 1 via ORAL
  Filled 2018-11-26 (×7): qty 1

## 2018-11-26 NOTE — ED Notes (Addendum)
PT in room. Pt assisted to standing position and tolerated very well. Pt states "it feels better standing than sitting"

## 2018-11-26 NOTE — ED Notes (Addendum)
Pt was moaning when I entered the room upon pt returning from x-ray.  Pt complaining of cramping in LLE.  Family states that pt takes medicine for cramping and wants to know if she can have some.  Dr. Dayna Barker and Elayne Snare notified of request.  Family notified that Dr. Dayna Barker is looking into it.

## 2018-11-26 NOTE — ED Triage Notes (Signed)
Pain medication requested by Family. Family also requested Pt to be repositioned in bed.Pt did not want to be repositiopned.

## 2018-11-26 NOTE — Progress Notes (Signed)
Patient seen and examined, admitted earlier this morning, H&P reviewed and agree with assessment and plan.  In brief, this is an 84 year old female with history of seizures, dementia, hypertension, hypothyroidism, who had a fall at home and presents with right groin pain.  In the ED she was found to have an inferior pubic rami fracture, and orthopedic surgery was consulted.  Patient reports that she was in her normal state of health prior to her fall and denies any syncope/loss of consciousness, no chest pain, no shortness of breath, no palpitations.  She has had no fever or chills, no dysuria, no cough.  BP (!) 133/51   Pulse 67   Resp 20   SpO2 92%  She is in no distress, lungs are clear, heart is regular, no peripheral edema.  She is neurologically intact.  Right-sided superior and inferior pubic rami fracture -Orthopedic surgery consulted, for now conservative management, PT consult, social worker consult for possible rehab  History of seizures -No reported seizures with a fall this morning, continue Keppra  Hypertension -Continue lisinopril  Hypothyroidism -Continue Synthroid  Dementia -Continue Aricept  Hyperlipidemia -Continue home medications  Chronic anemia -Follow CBC  Costin M. Cruzita Lederer, MD, PhD [facility] Pager [phone]  If 7PM-7AM, please contact night-coverage [email or web address] Password [email or web address]

## 2018-11-26 NOTE — H&P (Signed)
History and Physical    Renee Zuniga VBT:[id number] DOB: 1931/01/21 DOA: 11/25/2018  PCP: Seward Carol, MD  Patient coming from: Home.  Chief Complaint: Fall.  HPI: Renee Zuniga is a 84 y.o. female with history of seizures, dementia, hypertension, hypothyroidism, chronic anemia was brought to the ER after patient had a fall at home.  Patient states she was trying to close a door when she suddenly slipped and fell but denies hitting head or losing consciousness.  ED Course: In the ER x-rays revealed right superior and inferior pubic rami fracture.  Patient has been having significant pain and has required multiple doses of pain relief medications.  On-call orthopedic surgeon Dr. Percell Miller was consulted by ER physician who requested at this time admission for observation pain relief.  Review of Systems: As per HPI, rest all negative.   Past Medical History:  Diagnosis Date  . Arthritis   . Essential hypertension, benign   . Heart murmur   . High blood pressure   . Hypercholesteremia   . Insomnia   . Memory loss   . Migraine headache   . Mitral valve prolapse   . Osteoarthritis   . Osteopenia   . Seizures ([place])   . Tinnitus   . Vitamin D deficiency     Past Surgical History:  Procedure Laterality Date  . CHOLECYSTECTOMY    . CORRECTION HAMMER TOE Bilateral   . GALLBLADDER SURGERY    . OOPHORECTOMY     benign tumor, per patient  . PARTIAL HYSTERECTOMY  1974   due to uterine prolaspe     reports that she has never smoked. She has never used smokeless tobacco. She reports current alcohol use of about 1.0 standard drinks of alcohol per week. She reports that she does not use drugs.  Allergies  Allergen Reactions  . Ambien [Zolpidem] Nausea And Vomiting  . Demerol [Meperidine] Nausea And Vomiting       . Lunesta [Eszopiclone] Other (See Comments)    Affected speech  . Tramadol     Family History  Problem Relation Age of Onset  . Mitral valve prolapse  Father   . Heart Problems Mother   . Hypertension Mother     Prior to Admission medications   Medication Sig Start Date End Date Taking? Authorizing Provider  Calcium Carb-Cholecalciferol (OYSTER SHELL CALCIUM/VITAMIN D) 500-200 MG-UNIT TABS Take 1 tablet by mouth 2 (two) times daily.   Yes [provider]  chlordiazePOXIDE (LIBRIUM) 10 MG capsule Take 10 mg by mouth daily.    Yes [provider]  diclofenac sodium (VOLTAREN) 1 % GEL Apply 2 g topically daily. For knee pain   Yes [provider]  donepezil (ARICEPT) 10 MG tablet Take 1/2 tablet daily for 2 weeks, then increase to 1 tablet daily and continue Patient taking differently: Take 10 mg by mouth at bedtime.  10/15/18  Yes Cameron Sprang, MD  ezetimibe (ZETIA) 10 MG tablet Take 10 mg by mouth at bedtime.    Yes [provider]  ibandronate (BONIVA) 150 MG tablet Take 150 mg by mouth every 30 (thirty) days. Take in the morning with a full glass of water, on an empty stomach, and do not take anything else by mouth or lie down for the next 30 min.   Yes [provider]  ketoconazole (NIZORAL) 2 % shampoo Apply 1 application topically 3 (three) times a week. Apply to scalp after shampooing   Yes [provider]  levETIRAcetam (KEPPRA) 750 MG tablet Take 2 tablets twice a day Patient taking differently: Take 1,500 mg by mouth 2 (two) times daily.  10/15/18  Yes Cameron Sprang, MD  levothyroxine (SYNTHROID, LEVOTHROID) 50 MCG tablet Take 50 mcg by mouth daily before breakfast.   Yes [provider]  lisinopril (PRINIVIL,ZESTRIL) 10 MG tablet Take 10 mg by mouth daily.   Yes [provider]  LORazepam (ATIVAN) 1 MG tablet TAKE 1 TABLET AS NEEDED FOR SEIZURE. DO NOT TAKE MORE THAN 2 IN 24 HRS. Patient taking differently: Take 1 mg by mouth as needed for seizure. . 07/22/18  Yes Cameron Sprang, MD  Multiple Vitamins-Minerals (MULTIVITAMIN WITH MINERALS) tablet Take 1 tablet  by mouth daily.   Yes [provider]  Multiple Vitamins-Minerals (OCUVITE PO) Take 1 tablet by mouth daily.   Yes [provider]  Omega-3 Fatty Acids (FISH OIL) 1000 MG CAPS Take 1,000 mg by mouth daily.    Yes [provider]  pramipexole (MIRAPEX) 0.125 MG tablet Take 0.375 mg by mouth at bedtime.    Yes [provider]  vitamin E 400 UNIT capsule Take 400 Units by mouth daily.   Yes [provider]    Physical Exam: Vitals:   11/26/18 0230 11/26/18 0245 11/26/18 0402 11/26/18 0425  BP: (!) 153/69 (!) 134/59 (!) 112/58   Pulse: 76 70 82 (!) 107  Resp:  (!) 21 17 (!) 28  SpO2: 99% 99% 94% 95%      Constitutional: Moderately built and nourished. Vitals:   11/26/18 0230 11/26/18 0245 11/26/18 0402 11/26/18 0425  BP: (!) 153/69 (!) 134/59 (!) 112/58   Pulse: 76 70 82 (!) 107  Resp:  (!) 21 17 (!) 28  SpO2: 99% 99% 94% 95%   Eyes: Anicteric no pallor. ENMT: No discharge from the ears eyes nose or mouth. Neck: No mass felt.  No neck rigidity. Respiratory: No rhonchi or crepitations. Cardiovascular: S1-S2 heard. Abdomen: Soft nontender bowel sounds present. Musculoskeletal: No edema.  Pain on moving right hip. Skin: No rash. Neurologic: Alert awake oriented to time place and person.  Moves all extremities. Psychiatric: Appears normal.  Normal affect.   Labs on Admission: I have personally reviewed following labs and imaging studies  CBC: Recent Labs  Lab 11/25/18 2334  WBC 9.7  NEUTROABS 7.5  HGB 11.5*  HCT 36.2  MCV 97.8  PLT 546   Basic Metabolic Panel: Recent Labs  Lab 11/25/18 2334  NA 140  K 3.9  CL 110  CO2 17*  GLUCOSE 93  BUN 18  CREATININE 0.77  CALCIUM 9.2   GFR: CrCl cannot be calculated (Unknown ideal weight.). Liver Function Tests: Recent Labs  Lab 11/25/18 2334  AST 35  ALT 27  ALKPHOS 47  BILITOT 0.5  PROT 6.0*  ALBUMIN 3.6   No results for input(s): LIPASE, AMYLASE in the last 168  hours. No results for input(s): AMMONIA in the last 168 hours. Coagulation Profile: No results for input(s): INR, PROTIME in the last 168 hours. Cardiac Enzymes: No results for input(s): CKTOTAL, CKMB, CKMBINDEX, TROPONINI in the last 168 hours. BNP (last 3 results) No results for input(s): PROBNP in the last 8760 hours. HbA1C: No results for input(s): HGBA1C in the last 72 hours. CBG: No results for input(s): GLUCAP in the last 168 hours. Lipid Profile: No results for input(s): CHOL, HDL, LDLCALC, TRIG, CHOLHDL, LDLDIRECT in the last 72 hours. Thyroid Function Tests: No results for  input(s): TSH, T4TOTAL, FREET4, T3FREE, THYROIDAB in the last 72 hours. Anemia Panel: No results for input(s): VITAMINB12, FOLATE, FERRITIN, TIBC, IRON, RETICCTPCT in the last 72 hours. Urine analysis:    Component Value Date/Time   COLORURINE STRAW (A) 11/25/2018 2334   APPEARANCEUR CLEAR 11/25/2018 2334   LABSPEC 1.008 11/25/2018 2334   PHURINE 5.0 11/25/2018 2334   GLUCOSEU NEGATIVE 11/25/2018 2334   HGBUR MODERATE (A) 11/25/2018 2334   BILIRUBINUR NEGATIVE 11/25/2018 2334   BILIRUBINUR neg 10/06/2012 0825   KETONESUR NEGATIVE 11/25/2018 2334   PROTEINUR NEGATIVE 11/25/2018 2334   UROBILINOGEN 0.2 05/06/2015 1825   NITRITE NEGATIVE 11/25/2018 2334   LEUKOCYTESUR NEGATIVE 11/25/2018 2334   Sepsis Labs: @LABRCNTIP (procalcitonin:4,lacticidven:4) )No results found for this or any previous visit (from the past 240 hour(s)).   Radiological Exams on Admission: Dg Hip Unilat  With Pelvis 2-3 Views Right  Result Date: 11/26/2018 CLINICAL DATA:  Right hip pain after fall. EXAM: DG HIP (WITH OR WITHOUT PELVIS) 2-3V RIGHT COMPARISON:  None. FINDINGS: Mildly displaced fractures of the right superior and inferior pubic rami. No femur fracture. Hip joint spaces are relatively preserved. The pubic symphysis and sacroiliac joints are intact. Osteopenia. Degenerative changes of the lower lumbar spine.  IMPRESSION: 1. Mildly displaced fractures of the right superior and inferior pubic rami. Electronically Signed   By: Titus Dubin M.D.   On: 11/26/2018 00:55    EKG: Independently reviewed.  Normal sinus rhythm.  Assessment/Plan Principal Problem:   Pelvis fracture (HCC) Active Problems:   Mitral valve prolapse   HTN (hypertension)   Seizure (HCC)   Mild cognitive impairment   Pelvic fracture ([place])    1. Right-sided superior and inferior pubic rami fracture -orthopedic surgeon Dr. Percell Miller has been notified likely conservative management.  Will get physical therapy consult social worker possibly may need rehab.  Pain relief medications. 2. Seizures on Keppra. 3. Hypertension on lisinopril. 4. Hypothyroidism on Synthroid. 5. Dementia on Aricept. 6. Hyperlipidemia on Zetia. 7. Chronic anemia -follow CBC.   DVT prophylaxis: Lovenox. Code Status: Full code. Family Communication: Patient's daughter. Disposition Plan: May need rehab. Consults called: Orthopedics. Admission status: Observation.   Rise Patience MD [facility] Pager [phone].  If 7PM-7AM, please contact night-coverage [email or web address] Password [facility]  11/26/2018, 4:35 AM

## 2018-11-26 NOTE — Evaluation (Signed)
Physical Therapy Evaluation Patient Details Name: Renee Zuniga MRN: [id number] DOB: Jun 03, 1931 Today's Date: 11/26/2018   History of Present Illness  84 year old female with history of seizures, dementia, hypertension, hypothyroidism, who had a fall at home and presents with right groin pain.  In the ED she was found to have an inferior pubic rami fracture,  Clinical Impression  Orders received for PT evaluation. Patient demonstrates deficits in functional mobility as indicated below. Will benefit from continued skilled PT to address deficits and maximize function. Will see as indicated and progress as tolerated.  At this time, patient limited overall by pain. Did appear to tolerate standing well, with more pain in sitting and supine. Did not attempt ambulation due to patient anxiety. In current condition, feel patient will need post acute rehabilitation. Recommend ST SNF.    Follow Up Recommendations SNF;Supervision/Assistance - 24 hour    Equipment Recommendations  (TBD)    Recommendations for Other Services       Precautions / Restrictions Precautions Precautions: Fall Restrictions Weight Bearing Restrictions: Yes RLE Weight Bearing: Weight bearing as tolerated LLE Weight Bearing: Weight bearing as tolerated      Mobility  Bed Mobility Overal bed mobility: Needs Assistance Bed Mobility: Supine to Sit;Sit to Supine     Supine to sit: Max assist;+2 for physical assistance Sit to supine: Max assist;+2 for physical assistance   General bed mobility comments: Increased time and effort to perform, max assist with multi modal cues for positioning and sequencing. Assist of trunk control for elevation to upright and return to supine, assit for LE elevation back to bed  Transfers Overall transfer level: Needs assistance Equipment used: Rolling walker (2 wheeled) Transfers: Sit to/from Stand Sit to Stand: Mod assist;+2 physical assistance         General transfer  comment: Moderate assist to power to upright  Ambulation/Gait             General Gait Details: unable to perform  Stairs            Wheelchair Mobility    Modified Rankin (Stroke Patients Only)       Balance Overall balance assessment: History of Falls                                           Pertinent Vitals/Pain Pain Assessment: Faces Faces Pain Scale: Hurts whole lot Pain Location: groin and bilateral LE pain L>R calf pain Pain Descriptors / Indicators: Cramping;Discomfort;Grimacing;Guarding;Sharp Pain Intervention(s): Repositioned;Premedicated before session;Monitored during session;Limited activity within patient's tolerance;Relaxation;Heat applied    Home Living Family/patient expects to be discharged to:: Private residence Living Arrangements: Spouse/significant other Available Help at Discharge: Family Type of Home: House Home Access: Stairs to enter Entrance Stairs-Rails: Right;Left;Can reach both Entrance Stairs-Number of Steps: 4 Home Layout: Two level [place]: Grab bars - tub/shower;Walker - 2 wheels;Walker - 4 wheels      Prior Function Level of Independence: Independent               Hand Dominance   Dominant Hand: Right    Extremity/Trunk Assessment   Upper Extremity Assessment Upper Extremity Assessment: Defer to OT evaluation    Lower Extremity Assessment Lower Extremity Assessment: Generalized weakness;RLE deficits/detail;LLE deficits/detail RLE: Unable to fully assess due to pain RLE Sensation: (intact) RLE Coordination: decreased fine motor;decreased gross motor LLE: Unable to fully assess due to pain  LLE Sensation: (intact) LLE Coordination: decreased fine motor;decreased gross motor       Communication   Communication: No difficulties  Cognition Arousal/Alertness: Awake/alert Behavior During Therapy: Anxious Overall Cognitive Status: Impaired/Different from baseline Area of Impairment:  Memory;Awareness;Problem solving                     Memory: Decreased short-term memory     Awareness: Emergent Problem Solving: Slow processing;Decreased initiation;Requires verbal cues;Requires tactile cues General Comments: Had to repeat things during session several times, question memory verse attention re: pain      General Comments      Exercises     Assessment/Plan    PT Assessment Patient needs continued PT services  PT Problem List Decreased strength;Decreased activity tolerance;Decreased balance;Decreased mobility;Decreased cognition;Decreased safety awareness;Decreased knowledge of precautions;Pain       PT Treatment Interventions      PT Goals (Current goals can be found in the Care Plan section)  Acute Rehab PT Goals Patient Stated Goal: to go home (go to party on the 28th) PT Goal Formulation: With patient/family Time For Goal Achievement: 12/10/18 Potential to Achieve Goals: Good    Frequency Min 3X/week   Barriers to discharge        Co-evaluation               AM-PAC PT "6 Clicks" Mobility  Outcome Measure Help needed turning from your back to your side while in a flat bed without using bedrails?: A Lot Help needed moving from lying on your back to sitting on the side of a flat bed without using bedrails?: A Lot Help needed moving to and from a bed to a chair (including a wheelchair)?: A Lot Help needed standing up from a chair using your arms (e.g., wheelchair or bedside chair)?: A Lot Help needed to walk in hospital room?: A Lot Help needed climbing 3-5 steps with a railing? : Total 6 Click Score: 11    End of Session Equipment Utilized During Treatment: Gait belt Activity Tolerance: Patient limited by pain Patient left: in bed;with call bell/phone within reach;with family/visitor present Nurse Communication: Mobility status PT Visit Diagnosis: Repeated falls (R29.6);History of falling (Z91.81);Difficulty in walking, not  elsewhere classified (R26.2)    Time: 1610-9604 PT Time Calculation (min) (ACUTE ONLY): 27 min   Charges:   PT Evaluation $PT Eval Moderate Complexity: 1 Mod PT Treatments $Therapeutic Activity: 8-22 mins        Alben Deeds, PT DPT  Board Certified Neurologic Specialist [place] Pager [phone] Office [phone]   Duncan Dull 11/26/2018, 11:35 AM

## 2018-11-26 NOTE — ED Notes (Signed)
Admitting @ bedside

## 2018-11-26 NOTE — ED Notes (Addendum)
Pts daughter met this EMT at the nurses station to ask about pain medication, stating her mom called out and they have also asked for pain medication due to the pain in her back but have gotten no response. This EMT informed pts daughter that it would be mentioned to the nurse and the provider. Pts daughter continues to present to nurses station with the same request.

## 2018-11-26 NOTE — ED Notes (Signed)
bfast tray ordered 

## 2018-11-26 NOTE — Consult Note (Addendum)
ORTHOPAEDIC CONSULTATION  REQUESTING PHYSICIAN: Rise Patience, MD  Chief Complaint: Fall/right-sided groin pain  HPI: Renee Zuniga is a 84 y.o. female who complains of right-sided groin pain after a fall yesterday.  She presented to the emergency department where is showed mildly displaced right superior and inferior pubic rami fractures.  Orthopedics was consulted for evaluation.  This morning pain is controlled.  She denies numbness or paresthesia.    The patient was previously ambulatory and lives with her husband.  Past Medical History:  Diagnosis Date  . Arthritis   . Essential hypertension, benign   . Heart murmur   . High blood pressure   . Hypercholesteremia   . Insomnia   . Memory loss   . Migraine headache   . Mitral valve prolapse   . Osteoarthritis   . Osteopenia   . Seizures ([place])   . Tinnitus   . Vitamin D deficiency    Past Surgical History:  Procedure Laterality Date  . CHOLECYSTECTOMY    . CORRECTION HAMMER TOE Bilateral   . GALLBLADDER SURGERY    . OOPHORECTOMY     benign tumor, per patient  . PARTIAL HYSTERECTOMY  1974   due to uterine prolaspe   Social History   Socioeconomic History  . Marital status: Married    Spouse name: Timmothy Sours  . Number of children: 5  . Years of education: college  . Highest education level: Not on file  Occupational History    Comment: Retired  Scientific laboratory technician  . Financial resource strain: Not on file  . Food insecurity:    Worry: Not on file    Inability: Not on file  . Transportation needs:    Medical: Not on file    Non-medical: Not on file  Tobacco Use  . Smoking status: Never Smoker  . Smokeless tobacco: Never Used  Substance and Sexual Activity  . Alcohol use: Yes    Alcohol/week: 1.0 standard drinks    Types: 1 Glasses of wine per week    Comment: with dinner  . Drug use: No  . Sexual activity: Not Currently  Lifestyle  . Physical activity:    Days per week: Not on file    Minutes per  session: Not on file  . Stress: Not on file  Relationships  . Social connections:    Talks on phone: Not on file    Gets together: Not on file    Attends religious service: Not on file    Active member of club or organization: Not on file    Attends meetings of clubs or organizations: Not on file    Relationship status: Not on file  Other Topics Concern  . Not on file  Social History Narrative   Patient is retired and lives at home with her husband Timmothy Sours). Patient has college education.    Caffeine- tea - four glasses.   Right handed.   Family History  Problem Relation Age of Onset  . Mitral valve prolapse Father   . Heart Problems Mother   . Hypertension Mother    Allergies  Allergen Reactions  . Ambien [Zolpidem] Nausea And Vomiting  . Demerol [Meperidine] Nausea And Vomiting       . Lunesta [Eszopiclone] Other (See Comments)    Affected speech  . Tramadol    Prior to Admission medications   Medication Sig Start Date End Date Taking? Authorizing Provider  Calcium Carb-Cholecalciferol (OYSTER SHELL CALCIUM/VITAMIN D) 500-200 MG-UNIT TABS  Take 1 tablet by mouth 2 (two) times daily.   Yes [provider]  chlordiazePOXIDE (LIBRIUM) 10 MG capsule Take 10 mg by mouth daily.    Yes [provider]  diclofenac sodium (VOLTAREN) 1 % GEL Apply 2 g topically daily. For knee pain   Yes [provider]  donepezil (ARICEPT) 10 MG tablet Take 1/2 tablet daily for 2 weeks, then increase to 1 tablet daily and continue Patient taking differently: Take 10 mg by mouth at bedtime.  10/15/18  Yes Cameron Sprang, MD  ezetimibe (ZETIA) 10 MG tablet Take 10 mg by mouth at bedtime.    Yes [provider]  ibandronate (BONIVA) 150 MG tablet Take 150 mg by mouth every 30 (thirty) days. Take in the morning with a full glass of water, on an empty stomach, and do not take anything else by mouth or lie down for the next 30 min.   Yes [provider]    ketoconazole (NIZORAL) 2 % shampoo Apply 1 application topically 3 (three) times a week. Apply to scalp after shampooing   Yes [provider]  levETIRAcetam (KEPPRA) 750 MG tablet Take 2 tablets twice a day Patient taking differently: Take 1,500 mg by mouth 2 (two) times daily.  10/15/18  Yes Cameron Sprang, MD  levothyroxine (SYNTHROID, LEVOTHROID) 50 MCG tablet Take 50 mcg by mouth daily before breakfast.   Yes [provider]  lisinopril (PRINIVIL,ZESTRIL) 10 MG tablet Take 10 mg by mouth daily.   Yes [provider]  LORazepam (ATIVAN) 1 MG tablet TAKE 1 TABLET AS NEEDED FOR SEIZURE. DO NOT TAKE MORE THAN 2 IN 24 HRS. Patient taking differently: Take 1 mg by mouth as needed for seizure. . 07/22/18  Yes Cameron Sprang, MD  Multiple Vitamins-Minerals (MULTIVITAMIN WITH MINERALS) tablet Take 1 tablet by mouth daily.   Yes [provider]  Multiple Vitamins-Minerals (OCUVITE PO) Take 1 tablet by mouth daily.   Yes [provider]  Omega-3 Fatty Acids (FISH OIL) 1000 MG CAPS Take 1,000 mg by mouth daily.    Yes [provider]  pramipexole (MIRAPEX) 0.125 MG tablet Take 0.375 mg by mouth at bedtime.    Yes [provider]  vitamin E 400 UNIT capsule Take 400 Units by mouth daily.   Yes [provider]   Dg Hip Unilat  With Pelvis 2-3 Views Right  Result Date: 11/26/2018 CLINICAL DATA:  Right hip pain after fall. EXAM: DG HIP (WITH OR WITHOUT PELVIS) 2-3V RIGHT COMPARISON:  None. FINDINGS: Mildly displaced fractures of the right superior and inferior pubic rami. No femur fracture. Hip joint spaces are relatively preserved. The pubic symphysis and sacroiliac joints are intact. Osteopenia. Degenerative changes of the lower lumbar spine. IMPRESSION: 1. Mildly displaced fractures of the right superior and inferior pubic rami. Electronically Signed   By: Titus Dubin M.D.   On: 11/26/2018 00:55    Positive ROS: All other  systems have been reviewed and were otherwise negative with the exception of those mentioned in the HPI and as above.  Objective: Labs cbc Recent Labs    11/25/18 2334 11/26/18 0522  WBC 9.7 8.0  HGB 11.5* 11.2*  HCT 36.2 35.7*  PLT 204 220    Labs inflam No results for input(s): CRP in the last 72 hours.  Invalid input(s): ESR  Labs coag No results for input(s): INR, PTT in the last 72 hours.  Invalid input(s): PT  Recent Labs  11/25/18 2334 11/26/18 0522  NA 140 138  K 3.9 4.4  CL 110 105  CO2 17* 23  GLUCOSE 93 110*  BUN 18 17  CREATININE 0.77 0.78  CALCIUM 9.2 9.7    Physical Exam: Vitals:   11/26/18 0515 11/26/18 0600  BP:  (!) 134/55  Pulse: 90 90  Resp: (!) 21 (!) 22  SpO2: 99% 91%   General: Alert, no acute distress.  Supine in bed.  Calm, conversant.  Daughter at bedside. Mental status: Alert and Oriented x3 Neurologic: Speech Clear and organized, no gross focal findings or movement disorder appreciated. Respiratory: No cyanosis, no use of accessory musculature Cardiovascular: No pedal edema GI: Abdomen is soft and non-tender, non-distended. Skin: Warm and dry.  No lesions in the area of chief complaint  Extremities: Warm and well perfused w/o edema Psychiatric: Patient is competent for consent with normal mood and affect  MUSCULOSKELETAL:  RLE: Patient has right-sided groin pain with motion at the hip.  She is able to flex her knee up to 90 degrees independently.  Flexion, plantarflexion, EHL, FHL are intact.  Sensation is intact distally. Other extremities are atraumatic with painless ROM and NVI.  Assessment / Plan: Principal Problem:   Pelvis fracture ([facility]) Active Problems:   Mitral valve prolapse   HTN (hypertension)   Seizure (HCC)   Mild cognitive impairment   Pelvic fracture (H[facility])   Closed mildly displaced superior right pubic rami fractures Fractures amenable to nonoperative management Mobilize with therapy WBAT  Follow  up in the office with Dr. Alain Marion in 2 weeks.  Please call with questions.  She has done HHPT at baseline so may have difficulty mobilizing and may need Inpatient care with possible SNF vs AIR   Prudencio Burly III PA-C 11/26/2018 6:48 AM

## 2018-11-26 NOTE — ED Notes (Signed)
Pt's daughter at desk asking about meds.  I reminded her that EDP is checking on it.  She asked if the pt could have a washcloth to bite down on.  I assisted the family with this.

## 2018-11-27 DIAGNOSIS — S329XXS Fracture of unspecified parts of lumbosacral spine and pelvis, sequela: Secondary | ICD-10-CM

## 2018-11-27 MED ORDER — SENNOSIDES-DOCUSATE SODIUM 8.6-50 MG PO TABS
3.0000 | ORAL_TABLET | Freq: Two times a day (BID) | ORAL | Status: DC
  Administered 2018-11-27 – 2018-11-29 (×4): 3 via ORAL
  Filled 2018-11-27 (×5): qty 3

## 2018-11-27 NOTE — NC FL2 (Signed)
[place] LEVEL OF CARE SCREENING TOOL     IDENTIFICATION  Patient Name: Renee Zuniga Birthdate: 03/15/1931 Sex: female Admission Date (Current Location): 11/25/2018  [facility] and [place] Number:  Herbalist and Address:  [facility], [place] [address] [place], [place] [address]      Provider Number: [id number]  Attending Physician Name and Address:  Albertine Patricia, MD  Relative Name and Phone Number:  Elenore Rota, spouse, [phone]    Current Level of Care: Hospital Recommended Level of Care: [place] Prior Approval Number:    Date Approved/Denied:   PASRR Number: [id number]  Discharge Plan: SNF    Current Diagnoses: Patient Active Problem List   Diagnosis Date Noted  . Pelvis fracture ([place]) 11/26/2018  . Pelvic fracture ([place]) 11/26/2018  . Mild cognitive impairment 08/22/2017  . Status epilepticus ([place])   . Aphasia 08/19/2016  . Seizure ([place]) 08/19/2016  . Localization-related idiopathic epilepsy and epileptic syndromes with seizures of localized onset, not intractable, without status epilepticus ([place]) 08/12/2015  . TIA (transient ischemic attack) 11/09/2014  . HTN (hypertension) 11/09/2014  . Bilateral foot pain 03/31/2014  . Pes planus 03/31/2014  . Subluxation of ankle joint 03/31/2014  . Arthritis   . Heart murmur   . Mitral valve prolapse   . Osteoarthritis   . Vitamin D deficiency   . Migraine headache   . Hypercholesteremia   . Tinnitus   . Memory loss     Orientation RESPIRATION BLADDER Height & Weight     Self, Time, Situation, Place  Normal External catheter, Continent Weight:   Height:     BEHAVIORAL SYMPTOMS/MOOD NEUROLOGICAL BOWEL NUTRITION STATUS  (NA)   Continent Diet(Please see DC Summary)  AMBULATORY STATUS COMMUNICATION OF NEEDS Skin   Extensive Assist Verbally Normal                       Personal Care Assistance Level of Assistance  Bathing, Feeding,  Dressing Bathing Assistance: Limited assistance Feeding assistance: Independent Dressing Assistance: Limited assistance     Functional Limitations Info  Sight, Hearing, Speech Sight Info: Adequate Hearing Info: Adequate Speech Info: Adequate    SPECIAL CARE FACTORS FREQUENCY  PT (By licensed PT), OT (By licensed OT)     PT Frequency: 5x/week OT Frequency: 3x/week            Contractures Contractures Info: Not present    Additional Factors Info  Code Status, Allergies Code Status Info: Full Allergies Info: Ambien Zolpidem, Demerol Meperidine, Lunesta Eszopiclone, Tramadol           Current Medications (11/27/2018):  This is the current hospital active medication list Current Facility-Administered Medications  Medication Dose Route Frequency Provider Last Rate Last Dose  . acetaminophen (TYLENOL) tablet 650 mg  650 mg Oral Q6H PRN Rise Patience, MD   650 mg at 11/26/18 2951   Or  . acetaminophen (TYLENOL) suppository 650 mg  650 mg Rectal Q6H PRN Rise Patience, MD      . calcium-vitamin D (OSCAL WITH D) 500-200 MG-UNIT per tablet 1 tablet  1 tablet Oral BID Caren Griffins, MD   1 tablet at 11/27/18 0919  . cyclobenzaprine (FLEXERIL) tablet 5 mg  5 mg Oral TID PRN Caren Griffins, MD   5 mg at 11/27/18 0811  . donepezil (ARICEPT) tablet 10 mg  10 mg Oral QHS Rise Patience, MD   10 mg  at 11/26/18 2121  . enoxaparin (LOVENOX) injection 40 mg  40 mg Subcutaneous Q24H Rise Patience, MD   40 mg at 11/26/18 1710  . ezetimibe (ZETIA) tablet 10 mg  10 mg Oral QHS Rise Patience, MD   10 mg at 11/26/18 2121  . fentaNYL (SUBLIMAZE) injection 25 mcg  25 mcg Intravenous Q2H PRN Rise Patience, MD   25 mcg at 11/27/18 1234  . HYDROcodone-acetaminophen (NORCO/VICODIN) 5-325 MG per tablet 1 tablet  1 tablet Oral Q4H PRN Caren Griffins, MD   1 tablet at 11/27/18 [phone]  . levETIRAcetam (KEPPRA) tablet 1,500 mg  1,500 mg Oral BID Rise Patience, MD   1,500 mg at 11/27/18 0919  . levothyroxine (SYNTHROID, LEVOTHROID) tablet 50 mcg  50 mcg Oral Q0600 Rise Patience, MD   50 mcg at 11/27/18 0510  . lisinopril (PRINIVIL,ZESTRIL) tablet 10 mg  10 mg Oral Daily Rise Patience, MD   10 mg at 11/27/18 1771  . multivitamin with minerals tablet 1 tablet  1 tablet Oral Daily Rise Patience, MD   1 tablet at 11/27/18 0919  . omega-3 acid ethyl esters (LOVAZA) capsule 1 g  1 g Oral Daily Rise Patience, MD   1 g at 11/27/18 0919  . ondansetron (ZOFRAN) tablet 4 mg  4 mg Oral Q6H PRN Rise Patience, MD       Or  . ondansetron [facility]) injection 4 mg  4 mg Intravenous Q6H PRN Rise Patience, MD      . pramipexole (MIRAPEX) tablet 0.375 mg  0.375 mg Oral QHS Rise Patience, MD   0.375 mg at 11/26/18 2122  . vitamin E capsule 400 Units  400 Units Oral Daily Rise Patience, MD   400 Units at 11/27/18 1657     Discharge Medications: Please see discharge summary for a list of discharge medications.  Relevant Imaging Results:  Relevant Lab Results:   Additional Information SSn: [id number]  Benard Halsted, [place]

## 2018-11-27 NOTE — Progress Notes (Signed)
PROGRESS NOTE                                                                                                                                                                                                             Patient Demographics:    Renee Zuniga, is a 84 y.o. female, DOB - 17-Jan-1931, UDJ:[id number]  Admit date - 11/25/2018   Admitting Physician Costin Karlyne Greenspan, MD  Outpatient Primary MD for the patient is Seward Carol, MD  LOS - 1   Chief Complaint  Patient presents with  . Hip Pain       Brief Narrative    84 year old female with history of seizures, dementia, hypertension, hypothyroidism, who had a fall at home and presents with right groin pain.  In the ED she was found to have an inferior pubic rami fracture, and orthopedic surgery was consulted.  recommendation for nonoperative management   Subjective:    Renee Zuniga today has, No headache, No chest pain, No abdominal pain , still complains of pain in pelvic area   Assessment  & Plan :    Principal Problem:   Pelvis fracture ([place]) Active Problems:   Mitral valve prolapse   HTN (hypertension)   Seizure (HCC)   Mild cognitive impairment   Pelvic fracture (HCC)  Right-sided superior and inferior pubic rami fracture -Orthopedic surgery consulted, for now conservative management, PT/OT consulted, recommendation for SNF placement -Continue with PRN IV fentanyl and Vicodin for pain control -Is on stool softener regimen  History of seizures -No reported seizures with a fall this morning, continue Keppra  Hypertension -Continue lisinopril  Hypothyroidism -Continue Synthroid  Dementia -Continue Aricept  Hyperlipidemia -Continue home medications  Chronic anemia -Follow CBC     Code Status : Full   Family Communication  :daughter at bedside  Disposition Plan  : Will need SNF  Barriers For Discharge : still Requiring IV pain medicine for pain  control, she received total of 7 doses yesterday  Consults  :  Orthopedic  Procedures  : none  DVT Prophylaxis  :  Lovenox  Lab Results  Component Value Date   PLT 220 11/26/2018    Antibiotics  :    Anti-infectives (From admission, onward)   None        Objective:   Vitals:   11/26/18 1442  11/26/18 2306 11/27/18 0632 11/27/18 0917  BP: (!) 126/54 (!) 126/37 (!) 142/65 (!) 144/56  Pulse: 71 91 100   Resp: 18 18 20    Temp:  98.1 F (36.7 C) 97.8 F (36.6 C)   TempSrc:  Oral Oral   SpO2: 92% 93% 95%     Wt Readings from Last 3 Encounters:  10/15/18 71.4 kg  04/05/18 72.6 kg  08/20/17 74.4 kg     Intake/Output Summary (Last 24 hours) at 11/27/2018 1508 Last data filed at 11/27/2018 1237 Gross per 24 hour  Intake -  Output 1650 ml  Net -1650 ml     Physical Exam  Awake Alert, Oriented X 3, No new F.N deficits, Normal affect Symmetrical Chest wall movement, Good air movement bilaterally, CTAB RRR,No Gallops,Rubs or new Murmurs, No Parasternal Heave +ve B.Sounds, Abd Soft, No tenderness, No rebound - guarding or rigidity. No Cyanosis, Clubbing or edema, No new Rash or bruise      Data Review:    CBC Recent Labs  Lab 11/25/18 2334 11/26/18 0522  WBC 9.7 8.0  HGB 11.5* 11.2*  HCT 36.2 35.7*  PLT 204 220  MCV 97.8 97.3  MCH 31.1 30.5  MCHC 31.8 31.4  RDW 13.4 13.4  LYMPHSABS 1.4  --   MONOABS 0.7  --   EOSABS 0.1  --   BASOSABS 0.1  --     Chemistries  Recent Labs  Lab 11/25/18 2334 11/26/18 0522  NA 140 138  K 3.9 4.4  CL 110 105  CO2 17* 23  GLUCOSE 93 110*  BUN 18 17  CREATININE 0.77 0.78  CALCIUM 9.2 9.7  AST 35  --   ALT 27  --   ALKPHOS 47  --   BILITOT 0.5  --    ------------------------------------------------------------------------------------------------------------------ No results for input(s): CHOL, HDL, LDLCALC, TRIG, CHOLHDL, LDLDIRECT in the last 72 hours.  Lab Results  Component Value Date   HGBA1C 6.0  (H) 11/10/2014   ------------------------------------------------------------------------------------------------------------------ No results for input(s): TSH, T4TOTAL, T3FREE, THYROIDAB in the last 72 hours.  Invalid input(s): FREET3 ------------------------------------------------------------------------------------------------------------------ No results for input(s): VITAMINB12, FOLATE, FERRITIN, TIBC, IRON, RETICCTPCT in the last 72 hours.  Coagulation profile No results for input(s): INR, PROTIME in the last 168 hours.  No results for input(s): DDIMER in the last 72 hours.  Cardiac Enzymes No results for input(s): CKMB, TROPONINI, MYOGLOBIN in the last 168 hours.  Invalid input(s): CK ------------------------------------------------------------------------------------------------------------------ No results found for: BNP  Inpatient Medications  Scheduled Meds: . calcium-vitamin D  1 tablet Oral BID  . donepezil  10 mg Oral QHS  . enoxaparin (LOVENOX) injection  40 mg Subcutaneous Q24H  . ezetimibe  10 mg Oral QHS  . levETIRAcetam  1,500 mg Oral BID  . levothyroxine  50 mcg Oral Q0600  . lisinopril  10 mg Oral Daily  . multivitamin with minerals  1 tablet Oral Daily  . omega-3 acid ethyl esters  1 g Oral Daily  . pramipexole  0.375 mg Oral QHS  . senna-docusate  3 tablet Oral BID  . vitamin E  400 Units Oral Daily   Continuous Infusions: PRN Meds:.acetaminophen **OR** acetaminophen, cyclobenzaprine, fentaNYL (SUBLIMAZE) injection, HYDROcodone-acetaminophen, ondansetron **OR** ondansetron (ZOFRAN) IV  Micro Results No results found for this or any previous visit (from the past 240 hour(s)).  Radiology Reports Dg Hip Unilat  With Pelvis 2-3 Views Right  Result Date: 11/26/2018 CLINICAL DATA:  Right hip pain after fall. EXAM: DG HIP (WITH OR WITHOUT  PELVIS) 2-3V RIGHT COMPARISON:  None. FINDINGS: Mildly displaced fractures of the right superior and inferior pubic  rami. No femur fracture. Hip joint spaces are relatively preserved. The pubic symphysis and sacroiliac joints are intact. Osteopenia. Degenerative changes of the lower lumbar spine. IMPRESSION: 1. Mildly displaced fractures of the right superior and inferior pubic rami. Electronically Signed   By: Titus Dubin M.D.   On: 11/26/2018 00:55     Phillips Climes M.D on 11/27/2018 at 3:08 PM  Between 7am to 7pm - Pager - [phone]  After 7pm go to [email or web address] - password [facility]  [facility] -  Office  [phone]

## 2018-11-27 NOTE — Progress Notes (Signed)
Physical Therapy Treatment Patient Details Name: Renee Zuniga MRN: [id number] DOB: [birth year] Today's Date: 11/27/2018    History of Present Illness 84 year old female with history of seizures, dementia, hypertension, hypothyroidism, who had a fall at home and presents with right groin pain.  In the ED she was found to have an inferior pubic rami fracture,    PT Comments    Pt shows to be very painful with open or closed chain tasks, but participates well.  Emphasized need to move, bridging, transitions, sit to stand and pivotal transfers in the RW, all to practice sequencing to get the most movement with the least pain.   Follow Up Recommendations  SNF;Supervision/Assistance - 24 hour     Equipment Recommendations  None recommended by PT(TBA further)    Recommendations for Other Services       Precautions / Restrictions Precautions Precautions: Fall Restrictions RLE Weight Bearing: Weight bearing as tolerated LLE Weight Bearing: Weight bearing as tolerated    Mobility  Bed Mobility Overal bed mobility: Needs Assistance Bed Mobility: Supine to Sit     Supine to sit: Max assist;+2 for physical assistance     General bed mobility comments: worked on bridging technique with max and pad.  truncal assist to come up and forward.  pt left to scoot to EOB with bil UEs  Transfers Overall transfer level: Needs assistance Equipment used: Rolling walker (2 wheeled) Transfers: Sit to/from [facility] Sit to Stand: Mod assist;+2 physical assistance Stand pivot transfers: Mod assist;+2 physical assistance       General transfer comment: cues for sequencing and assist for stability and maneuvering the RW  Ambulation/Gait             General Gait Details: antalgic steps during pivot to the chair.   Stairs             Wheelchair Mobility    Modified Rankin (Stroke Patients Only)       Balance Overall balance assessment: History of  Falls;Needs assistance   Sitting balance-Leahy Scale: Fair       Standing balance-Leahy Scale: Poor Standing balance comment: reliant on the RW and external assist                            Cognition Arousal/Alertness: Awake/alert Behavior During Therapy: [facility] for tasks assessed/performed;Anxious Overall Cognitive Status: Within Functional Limits for tasks assessed                                        Exercises Other Exercises Other Exercises: gentle ROM to bil LE as warm up prior to mobility    General Comments        Pertinent Vitals/Pain Pain Assessment: 0-10 Pain Score: 9  Pain Location: right groin Pain Descriptors / Indicators: Discomfort;Grimacing;Guarding;Sharp Pain Intervention(s): Monitored during session;Patient requesting pain meds-RN notified;Limited activity within patient's tolerance    Home Living                      Prior Function            PT Goals (current goals can now be found in the care plan section) Acute Rehab PT Goals Patient Stated Goal: to go home (go to party on the 28th) PT Goal Formulation: With patient/family Time For Goal Achievement: 12/10/18 Potential to Achieve Goals: Good  Progress towards PT goals: Progressing toward goals    Frequency    Min 3X/week      PT Plan Current plan remains appropriate    Co-evaluation              AM-PAC PT "6 Clicks" Mobility   Outcome Measure  Help needed turning from your back to your side while in a flat bed without using bedrails?: A Lot Help needed moving from lying on your back to sitting on the side of a flat bed without using bedrails?: A Lot Help needed moving to and from a bed to a chair (including a wheelchair)?: A Lot Help needed standing up from a chair using your arms (e.g., wheelchair or bedside chair)?: A Lot Help needed to walk in hospital room?: A Lot Help needed climbing 3-5 steps with a railing? : Total 6 Click Score:  11    End of Session   Activity Tolerance: Patient limited by pain Patient left: in chair;with call bell/phone within reach;with family/visitor present;with chair alarm set Nurse Communication: Mobility status PT Visit Diagnosis: Repeated falls (R29.6);History of falling (Z91.81);Difficulty in walking, not elsewhere classified (R26.2);Pain Pain - part of body: (groin)     Time: 9470-7615 PT Time Calculation (min) (ACUTE ONLY): 28 min  Charges:  $Therapeutic Activity: 23-37 mins                     11/27/2018  Donnella Sham, PT Acute Rehabilitation Services [phone]  (pager) [phone]  (office)   Tessie Fass Adreanne Yono 11/27/2018, 1:45 PM

## 2018-11-27 NOTE — Clinical Social Work Note (Signed)
Clinical Social Work Assessment  Patient Details  Name: Renee Zuniga MRN: [id number] Date of Birth: 11/02/1931  Date of referral:  11/27/18               Reason for consult:  Facility Placement                Permission sought to share information with:  Facility Sport and exercise psychologist, Family Supports Permission granted to share information::  Yes, Verbal Permission Granted  Name::     [name]  Agency::  [facility]NF[facility]  Relationship::  Daughter  Contact Information:  [phone]  Housing/Transportation Living arrangements for the past 2 months:  [place] of Information:  Patient, Adult Children Patient Interpreter Needed:  None Criminal Activity/Legal Involvement Pertinent to Current Situation/Hospitalization:  No - Comment as needed Significant Relationships:  Adult Children, Spouse Lives with:  Spouse Do you feel safe going back to the place where you live?  No Need for family participation in patient care:  Yes (Comment)  Care giving concerns:  CSW received consult for possible SNF placement at time of discharge. CSW spoke with patient and her daughter at bedside regarding PT recommendation of SNF placement at time of discharge. Patient reported that patient's spouse is currently unable to care for patient at their home given patient's current physical needs and fall risk. Patient expressed understanding of PT recommendation and is agreeable to SNF placement at time of discharge. CSW to continue to follow and assist with discharge planning needs.   Social Worker assessment / plan:  CSW spoke with patient concerning possibility of rehab at [facility] before returning home.  Employment status:  Retired Forensic scientist:  Medicare PT Recommendations:  [place] / Referral to community resources:  [place]  Patient/Family's Response to care:  Patient recognizes need for rehab before returning home and is agreeable to a SNF in  [place]. Patient's spouse and daughter reported preference for [facility] because patient's spouse is on the board there.  Patient/Family's Understanding of and Emotional Response to Diagnosis, Current Treatment, and Prognosis:  Patient/family is realistic regarding therapy needs and expressed being hopeful for SNF placement. Patient expressed understanding of CSW role and discharge process as well as medical condition. No questions/concerns about plan or treatment.    Emotional Assessment Appearance:  Appears stated age Attitude/Demeanor/Rapport:  Engaged Affect (typically observed):  Accepting, Appropriate Orientation:  Oriented to Self, Oriented to Place, Oriented to  Time, Oriented to Situation Alcohol / Substance use:  Not Applicable Psych involvement (Current and /or in the community):  No (Comment)  Discharge Needs  Concerns to be addressed:  Care Coordination Readmission within the last 30 days:  No Current discharge risk:  None Barriers to Discharge:  Continued Medical Work up   [employer], LCSW 11/27/2018, 12:49 PM

## 2018-11-28 MED ORDER — OXYCODONE HCL 5 MG PO TABS
5.0000 mg | ORAL_TABLET | ORAL | Status: DC | PRN
  Administered 2018-11-28 – 2018-11-29 (×6): 5 mg via ORAL
  Filled 2018-11-28 (×6): qty 1

## 2018-11-28 MED ORDER — FENTANYL CITRATE (PF) 100 MCG/2ML IJ SOLN
25.0000 ug | INTRAMUSCULAR | Status: DC | PRN
  Administered 2018-11-28: 25 ug via INTRAVENOUS
  Filled 2018-11-28: qty 2

## 2018-11-28 NOTE — Plan of Care (Signed)
  Problem: Education: Goal: Knowledge of General Education information will improve Description Including pain rating scale, medication(s)/side effects and non-pharmacologic comfort measures Outcome: Progressing   Problem: Activity: Goal: Ability to ambulate and perform ADLs will improve Outcome: Not Progressing   Problem: Clinical Measurements: Goal: Postoperative complications will be avoided or minimized Outcome: Progressing   Problem: Self-Concept: Goal: Ability to maintain and perform role responsibilities to the fullest extent possible will improve Outcome: Progressing   Problem: Pain Management: Goal: Pain level will decrease Outcome: Not Progressing

## 2018-11-28 NOTE — Plan of Care (Signed)
  Problem: Education: Goal: Knowledge of General Education information will improve Description Including pain rating scale, medication(s)/side effects and non-pharmacologic comfort measures Outcome: Progressing   Problem: Activity: Goal: Ability to ambulate and perform ADLs will improve Outcome: Progressing   Problem: Pain Management: Goal: Pain level will decrease Outcome: Progressing

## 2018-11-28 NOTE — Progress Notes (Signed)
PROGRESS NOTE                                                                                                                                                                                                             Patient Demographics:    Renee Zuniga, is a 84 y.o. female, DOB - [birth year], ZOX:[id number]  Admit date - 11/25/2018   Admitting Physician Costin Karlyne Greenspan, MD  Outpatient Primary MD for the patient is Seward Carol, MD  LOS - 2   Chief Complaint  Patient presents with  . Hip Pain       Brief Narrative    84 year old female with history of seizures, dementia, hypertension, hypothyroidism, who had a fall at home and presents with right groin pain.  In the ED she was found to have an inferior pubic rami fracture, and orthopedic surgery was consulted.  recommendation for nonoperative management   Subjective:    Renee Zuniga today has, No headache, No chest pain, No abdominal pain , it is better controlled, she had good bowel movement yesterday .   Assessment  & Plan :    Principal Problem:   Pelvis fracture ([place]) Active Problems:   Mitral valve prolapse   HTN (hypertension)   Seizure (HCC)   Mild cognitive impairment   Pelvic fracture (HCC)  Right-sided superior and inferior pubic rami fracture -Orthopedic surgery consulted, for now conservative management, PT/OT consulted, recommendation for SNF placement -She required total of 4 doses of IV fentanyl yesterday, discussed with patient and family, will try to minimize fentanyl today, have decreased frequency, will start on oxycodone instead of Vicodin -Continue with stool softener  History of seizures -No reported seizures with a fall this morning, continue Keppra  Hypertension -Continue lisinopril  Hypothyroidism -Continue Synthroid  Dementia -Continue Aricept  Hyperlipidemia -Continue home medications  Chronic anemia -Follow CBC     Code  Status : Full   Family Communication  :daughter at bedside  Disposition Plan  :  SNF in am  Barriers For Discharge : still Requiring IV pain medicine for pain control, she received total of 7 doses yesterday  Consults  :  Orthopedic  Procedures  : none  DVT Prophylaxis  :  Lovenox  Lab Results  Component Value Date   PLT 220 11/26/2018    Antibiotics  :  Anti-infectives (From admission, onward)   None        Objective:   Vitals:   11/27/18 0917 11/27/18 1518 11/27/18 2159 11/28/18 0449  BP: (!) 144/56 126/80 (!) 151/63 (!) 157/79  Pulse:  83 95 (!) 102  Resp:  17 18 18   Temp:  98.2 F (36.8 C) 98.1 F (36.7 C) 98.2 F (36.8 C)  TempSrc:  Oral Oral Oral  SpO2:  94% 94% 95%    Wt Readings from Last 3 Encounters:  10/15/18 71.4 kg  04/05/18 72.6 kg  08/20/17 74.4 kg     Intake/Output Summary (Last 24 hours) at 11/28/2018 1511 Last data filed at 11/28/2018 1034 Gross per 24 hour  Intake 240 ml  Output 1100 ml  Net -860 ml     Physical Exam  Awake Alert, Oriented X 3, No new F.N deficits, Normal affect Symmetrical Chest wall movement, Good air movement bilaterally, CTAB RRR,No Gallops,Rubs or new Murmurs, No Parasternal Heave +ve B.Sounds, Abd Soft, No tenderness, No rebound - guarding or rigidity. No Cyanosis, Clubbing or edema, No new Rash or bruise        Data Review:    CBC Recent Labs  Lab 11/25/18 2334 11/26/18 0522  WBC 9.7 8.0  HGB 11.5* 11.2*  HCT 36.2 35.7*  PLT 204 220  MCV 97.8 97.3  MCH 31.1 30.5  MCHC 31.8 31.4  RDW 13.4 13.4  LYMPHSABS 1.4  --   MONOABS 0.7  --   EOSABS 0.1  --   BASOSABS 0.1  --     Chemistries  Recent Labs  Lab 11/25/18 2334 11/26/18 0522  NA 140 138  K 3.9 4.4  CL 110 105  CO2 17* 23  GLUCOSE 93 110*  BUN 18 17  CREATININE 0.77 0.78  CALCIUM 9.2 9.7  AST 35  --   ALT 27  --   ALKPHOS 47  --   BILITOT 0.5  --     ------------------------------------------------------------------------------------------------------------------ No results for input(s): CHOL, HDL, LDLCALC, TRIG, CHOLHDL, LDLDIRECT in the last 72 hours.  Lab Results  Component Value Date   HGBA1C 6.0 (H) 11/10/2014   ------------------------------------------------------------------------------------------------------------------ No results for input(s): TSH, T4TOTAL, T3FREE, THYROIDAB in the last 72 hours.  Invalid input(s): FREET3 ------------------------------------------------------------------------------------------------------------------ No results for input(s): VITAMINB12, FOLATE, FERRITIN, TIBC, IRON, RETICCTPCT in the last 72 hours.  Coagulation profile No results for input(s): INR, PROTIME in the last 168 hours.  No results for input(s): DDIMER in the last 72 hours.  Cardiac Enzymes No results for input(s): CKMB, TROPONINI, MYOGLOBIN in the last 168 hours.  Invalid input(s): CK ------------------------------------------------------------------------------------------------------------------ No results found for: BNP  Inpatient Medications  Scheduled Meds: . calcium-vitamin D  1 tablet Oral BID  . donepezil  10 mg Oral QHS  . enoxaparin (LOVENOX) injection  40 mg Subcutaneous Q24H  . ezetimibe  10 mg Oral QHS  . levETIRAcetam  1,500 mg Oral BID  . levothyroxine  50 mcg Oral Q0600  . lisinopril  10 mg Oral Daily  . multivitamin with minerals  1 tablet Oral Daily  . omega-3 acid ethyl esters  1 g Oral Daily  . pramipexole  0.375 mg Oral QHS  . senna-docusate  3 tablet Oral BID  . vitamin E  400 Units Oral Daily   Continuous Infusions: PRN Meds:.acetaminophen **OR** acetaminophen, cyclobenzaprine, fentaNYL (SUBLIMAZE) injection, ondansetron **OR** ondansetron (ZOFRAN) IV, oxyCODONE  Micro Results No results found for this or any previous visit (from the past 240 hour(s)).  Radiology  Reports Dg Hip  Unilat  With Pelvis 2-3 Views Right  Result Date: 11/26/2018 CLINICAL DATA:  Right hip pain after fall. EXAM: DG HIP (WITH OR WITHOUT PELVIS) 2-3V RIGHT COMPARISON:  None. FINDINGS: Mildly displaced fractures of the right superior and inferior pubic rami. No femur fracture. Hip joint spaces are relatively preserved. The pubic symphysis and sacroiliac joints are intact. Osteopenia. Degenerative changes of the lower lumbar spine. IMPRESSION: 1. Mildly displaced fractures of the right superior and inferior pubic rami. Electronically Signed   By: Titus Dubin M.D.   On: 11/26/2018 00:55     Phillips Climes M.D on 11/28/2018 at 3:11 PM  Between 7am to 7pm - Pager - [phone]  After 7pm go to [email or web address] - password [facility]  [facility] -  Office  [phone]

## 2018-11-29 DIAGNOSIS — S3289XA Fracture of other parts of pelvis, initial encounter for closed fracture: Secondary | ICD-10-CM | POA: Diagnosis not present

## 2018-11-29 DIAGNOSIS — Z7401 Bed confinement status: Secondary | ICD-10-CM | POA: Diagnosis not present

## 2018-11-29 DIAGNOSIS — I10 Essential (primary) hypertension: Secondary | ICD-10-CM | POA: Diagnosis not present

## 2018-11-29 DIAGNOSIS — M25551 Pain in right hip: Secondary | ICD-10-CM | POA: Diagnosis not present

## 2018-11-29 DIAGNOSIS — G459 Transient cerebral ischemic attack, unspecified: Secondary | ICD-10-CM | POA: Diagnosis not present

## 2018-11-29 DIAGNOSIS — D539 Nutritional anemia, unspecified: Secondary | ICD-10-CM | POA: Diagnosis not present

## 2018-11-29 DIAGNOSIS — R52 Pain, unspecified: Secondary | ICD-10-CM | POA: Diagnosis not present

## 2018-11-29 DIAGNOSIS — R2689 Other abnormalities of gait and mobility: Secondary | ICD-10-CM | POA: Diagnosis not present

## 2018-11-29 DIAGNOSIS — S32591D Other specified fracture of right pubis, subsequent encounter for fracture with routine healing: Secondary | ICD-10-CM | POA: Diagnosis not present

## 2018-11-29 DIAGNOSIS — E559 Vitamin D deficiency, unspecified: Secondary | ICD-10-CM | POA: Diagnosis not present

## 2018-11-29 DIAGNOSIS — M8580 Other specified disorders of bone density and structure, unspecified site: Secondary | ICD-10-CM | POA: Diagnosis not present

## 2018-11-29 DIAGNOSIS — G40909 Epilepsy, unspecified, not intractable, without status epilepticus: Secondary | ICD-10-CM | POA: Diagnosis not present

## 2018-11-29 DIAGNOSIS — G40009 Localization-related (focal) (partial) idiopathic epilepsy and epileptic syndromes with seizures of localized onset, not intractable, without status epilepticus: Secondary | ICD-10-CM | POA: Diagnosis not present

## 2018-11-29 DIAGNOSIS — E039 Hypothyroidism, unspecified: Secondary | ICD-10-CM | POA: Diagnosis not present

## 2018-11-29 DIAGNOSIS — M1991 Primary osteoarthritis, unspecified site: Secondary | ICD-10-CM | POA: Diagnosis not present

## 2018-11-29 DIAGNOSIS — F0280 Dementia in other diseases classified elsewhere without behavioral disturbance: Secondary | ICD-10-CM | POA: Diagnosis not present

## 2018-11-29 DIAGNOSIS — S329XXS Fracture of unspecified parts of lumbosacral spine and pelvis, sequela: Secondary | ICD-10-CM | POA: Diagnosis not present

## 2018-11-29 DIAGNOSIS — M6281 Muscle weakness (generalized): Secondary | ICD-10-CM | POA: Diagnosis not present

## 2018-11-29 DIAGNOSIS — E785 Hyperlipidemia, unspecified: Secondary | ICD-10-CM | POA: Diagnosis not present

## 2018-11-29 DIAGNOSIS — M2550 Pain in unspecified joint: Secondary | ICD-10-CM | POA: Diagnosis not present

## 2018-11-29 DIAGNOSIS — F0390 Unspecified dementia without behavioral disturbance: Secondary | ICD-10-CM | POA: Diagnosis not present

## 2018-11-29 DIAGNOSIS — S3282XD Multiple fractures of pelvis without disruption of pelvic ring, subsequent encounter for fracture with routine healing: Secondary | ICD-10-CM | POA: Diagnosis not present

## 2018-11-29 DIAGNOSIS — Z9181 History of falling: Secondary | ICD-10-CM | POA: Diagnosis not present

## 2018-11-29 DIAGNOSIS — S3289XD Fracture of other parts of pelvis, subsequent encounter for fracture with routine healing: Secondary | ICD-10-CM | POA: Diagnosis not present

## 2018-11-29 DIAGNOSIS — S32511D Fracture of superior rim of right pubis, subsequent encounter for fracture with routine healing: Secondary | ICD-10-CM | POA: Diagnosis not present

## 2018-11-29 MED ORDER — SENNOSIDES-DOCUSATE SODIUM 8.6-50 MG PO TABS: 2.0000 | ORAL_TABLET | Freq: Two times a day (BID) | ORAL | Status: DC

## 2018-11-29 MED ORDER — LORAZEPAM 1 MG PO TABS: ORAL_TABLET | ORAL | 0 refills | Status: DC

## 2018-11-29 MED ORDER — CYCLOBENZAPRINE HCL 5 MG PO TABS: 5.0000 mg | ORAL_TABLET | Freq: Three times a day (TID) | ORAL | 0 refills | Status: DC | PRN

## 2018-11-29 MED ORDER — CHLORDIAZEPOXIDE HCL 10 MG PO CAPS: 10.0000 mg | ORAL_CAPSULE | Freq: Every day | ORAL | 0 refills | Status: DC

## 2018-11-29 MED ORDER — OXYCODONE HCL 5 MG PO TABS: 5.0000 mg | ORAL_TABLET | ORAL | 0 refills | Status: DC | PRN

## 2018-11-29 MED ORDER — ACETAMINOPHEN 325 MG PO TABS: 650.0000 mg | ORAL_TABLET | Freq: Four times a day (QID) | ORAL | Status: DC | PRN

## 2018-11-29 NOTE — Plan of Care (Signed)
  Problem: Education: Goal: Knowledge of General Education information will improve Description Including pain rating scale, medication(s)/side effects and non-pharmacologic comfort measures Outcome: Progressing   Problem: Activity: Goal: Ability to ambulate and perform ADLs will improve Outcome: Progressing   Problem: Pain Management: Goal: Pain level will decrease Outcome: Progressing

## 2018-11-29 NOTE — Progress Notes (Signed)
Patient will DC to: [facility] Anticipated DC date: 11/29/18 Family notified: Daughters at [employer] by: Corey Harold 12pm   Per MD patient ready for DC to [place]. RN, patient, patient's family, and facility notified of DC. Discharge Summary and FL2 sent to facility. RN to call report prior to discharge [phone] Room 7011). DC packet on chart. Ambulance transport requested for patient.   CSW will sign off for now as social work intervention is no longer needed. Please consult [place] again if new needs arise.  Cedric Fishman, LCSW Clinical Social Worker [phone]

## 2018-11-29 NOTE — Discharge Instructions (Signed)
Follow with Primary MD Seward Carol, MD or SNF physician  Get CBC, CMP,checked  by Primary MD next visit.    Activity: As tolerated with Full fall precautions use walker/cane & assistance as needed   Disposition SNF   Diet: Heart Healthy  , with feeding assistance and aspiration precautions.  For Heart failure patients - Check your Weight same time everyday, if you gain over 2 pounds, or you develop in leg swelling, experience more shortness of breath or chest pain, call your Primary MD immediately. Follow Cardiac Low Salt Diet and 1.5 lit/day fluid restriction.   On your next visit with your primary care physician please Get Medicines reviewed and adjusted.   Please request your Prim.MD to go over all Hospital Tests and Procedure/Radiological results at the follow up, please get all Hospital records sent to your Prim MD by signing hospital release before you go home.   If you experience worsening of your admission symptoms, develop shortness of breath, life threatening emergency, suicidal or homicidal thoughts you must seek medical attention immediately by calling 911 or calling your MD immediately  if symptoms less severe.  You Must read complete instructions/literature along with all the possible adverse reactions/side effects for all the Medicines you take and that have been prescribed to you. Take any new Medicines after you have completely understood and accpet all the possible adverse reactions/side effects.   Do not drive, operating heavy machinery, perform activities at heights, swimming or participation in water activities or provide baby sitting services if your were admitted for syncope or siezures until you have seen by Primary MD or a Neurologist and advised to do so again.  Do not drive when taking Pain medications.    Do not take more than prescribed Pain, Sleep and Anxiety Medications  Special Instructions: If you have smoked or chewed Tobacco  in the last 2 yrs  please stop smoking, stop any regular Alcohol  and or any Recreational drug use.  Wear Seat belts while driving.   Please note  You were cared for by a hospitalist during your hospital stay. If you have any questions about your discharge medications or the care you received while you were in the hospital after you are discharged, you can call the unit and asked to speak with the hospitalist on call if the hospitalist that took care of you is not available. Once you are discharged, your primary care physician will handle any further medical issues. Please note that NO REFILLS for any discharge medications will be authorized once you are discharged, as it is imperative that you return to your primary care physician (or establish a relationship with a primary care physician if you do not have one) for your aftercare needs so that they can reassess your need for medications and monitor your lab values.

## 2018-11-29 NOTE — Progress Notes (Signed)
Patient and family at bedside given a copy of discharge instructions. IVs removed without difficulty. Patient dressed and placed on EMS stretcher. Papers and instructions given to EMS. Called facility to give report.

## 2018-11-29 NOTE — Discharge Summary (Signed)
Renee Zuniga, is a 84 y.o. female  DOB 29-Jul-1931  MRN [id number].  Admission date:  11/25/2018  Admitting Physician  Costin Karlyne Greenspan, MD  Discharge Date:  11/29/2018   Primary MD  Seward Carol, MD  Recommendations for primary care physician for things to follow:  - Please check CBC, BMP in 3 days. - Patient to follow with Ortho as out patient in 10 days.   Admission Diagnosis  Multiple closed fractures of pelvis without disruption of pelvic ring, initial encounter ([place]) [S32.82XA] Pelvic fracture ([place]) [S32.9XXA]   Discharge Diagnosis  Multiple closed fractures of pelvis without disruption of pelvic ring, initial encounter ([place]) [S32.82XA] Pelvic fracture ([place]) [S32.9XXA]    Principal Problem:   Pelvis fracture ([place]) Active Problems:   Mitral valve prolapse   HTN (hypertension)   Seizure (HCC)   Mild cognitive impairment   Pelvic fracture (HCC)      Past Medical History:  Diagnosis Date  . Arthritis   . Essential hypertension, benign   . Heart murmur   . High blood pressure   . Hypercholesteremia   . Insomnia   . Memory loss   . Migraine headache   . Mitral valve prolapse   . Osteoarthritis   . Osteopenia   . Seizures ([place])   . Tinnitus   . Vitamin D deficiency     Past Surgical History:  Procedure Laterality Date  . CHOLECYSTECTOMY    . CORRECTION HAMMER TOE Bilateral   . GALLBLADDER SURGERY    . OOPHORECTOMY     benign tumor, per patient  . PARTIAL HYSTERECTOMY  1974   due to uterine prolaspe       History of present illness and  Hospital Course:     Kindly see H&P for history of present illness and admission details, please review complete Labs, Consult reports and Test reports for all details in brief  HPI  from the history and physical done on the day of admission 11/26/2018  HPI: Renee Zuniga is a 84 y.o. female with history of seizures, dementia,  hypertension, hypothyroidism, chronic anemia was brought to the ER after patient had a fall at home.  Patient states she was trying to close a door when she suddenly slipped and fell but denies hitting head or losing consciousness.  ED Course: In the ER x-rays revealed right superior and inferior pubic rami fracture.  Patient has been having significant pain and has required multiple doses of pain relief medications.  On-call orthopedic surgeon Dr. Percell Miller was consulted by ER physician who requested at this time admission for observation pain relief.   Hospital Course    84 year old female with history of seizures, dementia, hypertension, hypothyroidism, who had a fall at home and presents with right groin pain. In the ED she was found to have an inferior pubic rami fracture, and orthopedic surgery was consulted.  recommendation for nonoperative management  Right-sided superior and inferior pubic rami fracture -Orthopedic surgery consulted, recommendations for now conservative management, PT/OT consulted,  recommendation for SNF placement -Follow with orthopedic as an outpatient in 10 days  -Discharged on PRN and Tylenol, oxycodone, Flexeril, and good bowel regimen . -Weightbearing as tolerated    History of seizures -Continue with home medications  Hypertension -Continue lisinopril  Hypothyroidism -Continue Synthroid  Dementia -Continue Aricept  Hyperlipidemia -Continue home medications  Chronic anemia, unspecified -Hemoglobin at baseline at baseline    Discharge Condition:  Stable   Follow UP   Contact information for follow-up providers    Renee Butters, MD Follow up in 2 week(s).   Specialty:  Orthopedic Surgery Contact information: [address] [address] [phone]            Contact information for after-discharge care    Destination    [facility] .   Service:  Skilled  Nursing Contact information: [address] [place] [phone]                    Discharge Instructions  and  Discharge Medications    Discharge Instructions    Discharge instructions   Complete by:  As directed    Follow with Primary MD Seward Carol, MD or SNF physician  Get CBC, CMP, checked  by Primary MD next visit.    Activity: As tolerated with Full fall precautions use walker/cane & assistance as needed   Disposition SNF   Diet: Heart Healthy  , with feeding assistance and aspiration precautions.  For Heart failure patients - Check your Weight same time everyday, if you gain over 2 pounds, or you develop in leg swelling, experience more shortness of breath or chest pain, call your Primary MD immediately. Follow Cardiac Low Salt Diet and 1.5 lit/day fluid restriction.   On your next visit with your primary care physician please Get Medicines reviewed and adjusted.   Please request your Prim.MD to go over all Hospital Tests and Procedure/Radiological results at the follow up, please get all Hospital records sent to your Prim MD by signing hospital release before you go home.   If you experience worsening of your admission symptoms, develop shortness of breath, life threatening emergency, suicidal or homicidal thoughts you must seek medical attention immediately by calling 911 or calling your MD immediately  if symptoms less severe.  You Must read complete instructions/literature along with all the possible adverse reactions/side effects for all the Medicines you take and that have been prescribed to you. Take any new Medicines after you have completely understood and accpet all the possible adverse reactions/side effects.   Do not drive, operating heavy machinery, perform activities at heights, swimming or participation in water activities or provide baby sitting services if your were admitted for syncope or siezures until you  have seen by Primary MD or a Neurologist and advised to do so again.  Do not drive when taking Pain medications.    Do not take more than prescribed Pain, Sleep and Anxiety Medications  Special Instructions: If you have smoked or chewed Tobacco  in the last 2 yrs please stop smoking, stop any regular Alcohol  and or any Recreational drug use.  Wear Seat belts while driving.   Please note  You were cared for by a hospitalist during your hospital stay. If you have any questions about your discharge medications or the care you received while you were in the hospital after you are discharged, you can call the unit and asked to speak with the  hospitalist on call if the hospitalist that took care of you is not available. Once you are discharged, your primary care physician will handle any further medical issues. Please note that NO REFILLS for any discharge medications will be authorized once you are discharged, as it is imperative that you return to your primary care physician (or establish a relationship with a primary care physician if you do not have one) for your aftercare needs so that they can reassess your need for medications and monitor your lab values.   Increase activity slowly   Complete by:  As directed      Allergies as of 11/29/2018      Reactions   Ambien [zolpidem] Nausea And Vomiting   Demerol [meperidine] Nausea And Vomiting      Lunesta [eszopiclone] Other (See Comments)   Affected speech   Tramadol       Medication List    TAKE these medications   acetaminophen 325 MG tablet Commonly known as:  TYLENOL Take 2 tablets (650 mg total) by mouth every 6 (six) hours as needed for mild pain (or Fever >/= 101).   BONIVA 150 MG tablet Generic drug:  ibandronate Take 150 mg by mouth every 30 (thirty) days. Take in the morning with a full glass of water, on an empty stomach, and do not take anything else by mouth or lie down for the next 30 min.   chlordiazePOXIDE 10 MG  capsule Commonly known as:  LIBRIUM Take 1 capsule (10 mg total) by mouth daily.   cyclobenzaprine 5 MG tablet Commonly known as:  FLEXERIL Take 1 tablet (5 mg total) by mouth 3 (three) times daily as needed for muscle spasms.   diclofenac sodium 1 % Gel Commonly known as:  VOLTAREN Apply 2 g topically daily. For knee pain   donepezil 10 MG tablet Commonly known as:  ARICEPT Take 1/2 tablet daily for 2 weeks, then increase to 1 tablet daily and continue What changed:    how much to take  how to take this  when to take this  additional instructions   ezetimibe 10 MG tablet Commonly known as:  ZETIA Take 10 mg by mouth at bedtime.   Fish Oil 1000 MG Caps Take 1,000 mg by mouth daily.   ketoconazole 2 % shampoo Commonly known as:  NIZORAL Apply 1 application topically 3 (three) times a week. Apply to scalp after shampooing   levETIRAcetam 750 MG tablet Commonly known as:  KEPPRA Take 2 tablets twice a day What changed:    how much to take  how to take this  when to take this  additional instructions   levothyroxine 50 MCG tablet Commonly known as:  SYNTHROID, LEVOTHROID Take 50 mcg by mouth daily before breakfast.   lisinopril 10 MG tablet Commonly known as:  PRINIVIL,ZESTRIL Take 10 mg by mouth daily.   LORazepam 1 MG tablet Commonly known as:  ATIVAN TAKE 1 TABLET AS NEEDED FOR SEIZURE. DO NOT TAKE MORE THAN 2 IN 24 HRS. What changed:    how much to take  how to take this  when to take this  reasons to take this  additional instructions   OCUVITE PO Take 1 tablet by mouth daily.   multivitamin with minerals tablet Take 1 tablet by mouth daily.   oxyCODONE 5 MG immediate release tablet Commonly known as:  Oxy IR/ROXICODONE Take 1 tablet (5 mg total) by mouth every 4 (four) hours as needed for moderate pain.  Oyster Shell Calcium/Vitamin D 500-200 MG-UNIT Tabs Take 1 tablet by mouth 2 (two) times daily.   pramipexole 0.125 MG  tablet Commonly known as:  MIRAPEX Take 0.375 mg by mouth at bedtime.   senna-docusate 8.6-50 MG tablet Commonly known as:  Senokot-S Take 2 tablets by mouth 2 (two) times daily.   vitamin E 400 UNIT capsule Take 400 Units by mouth daily.         Diet and Activity recommendation: See Discharge Instructions above   Consults obtained -  Orthopedic   Major procedures and Radiology Reports - PLEASE review detailed and final reports for all details, in brief -     Dg Hip Unilat  With Pelvis 2-3 Views Right  Result Date: 11/26/2018 CLINICAL DATA:  Right hip pain after fall. EXAM: DG HIP (WITH OR WITHOUT PELVIS) 2-3V RIGHT COMPARISON:  None. FINDINGS: Mildly displaced fractures of the right superior and inferior pubic rami. No femur fracture. Hip joint spaces are relatively preserved. The pubic symphysis and sacroiliac joints are intact. Osteopenia. Degenerative changes of the lower lumbar spine. IMPRESSION: 1. Mildly displaced fractures of the right superior and inferior pubic rami. Electronically Signed   By: Titus Dubin M.D.   On: 11/26/2018 00:55    Micro Results    No results found for this or any previous visit (from the past 240 hour(s)).     Today   Subjective:   Renee Zuniga today has no headache,no chest or abdominal pain, but her pelvis pain better controlled, had good bowel movement yesterday   Objective:   Blood pressure 134/64, pulse 95, temperature (!) 97.3 F (36.3 C), temperature source Oral, resp. rate 19, SpO2 95 %.   Intake/Output Summary (Last 24 hours) at 11/29/2018 0943 Last data filed at 11/29/2018 0838 Gross per 24 hour  Intake 240 ml  Output 1750 ml  Net -1510 ml    Exam Awake Alert, Oriented x 3, No new F.N deficits, Normal affect Symmetrical Chest wall movement, Good air movement bilaterally, CTAB RRR,No Gallops,Rubs or new Murmurs, No Parasternal Heave +ve B.Sounds, Abd Soft, Non tender,  No rebound -guarding or  rigidity. No Cyanosis, Clubbing or edema, No new Rash or bruise  Data Review   CBC w Diff:  Lab Results  Component Value Date   WBC 8.0 11/26/2018   HGB 11.2 (L) 11/26/2018   HCT 35.7 (L) 11/26/2018   PLT 220 11/26/2018   LYMPHOPCT 14 11/25/2018   MONOPCT 7 11/25/2018   EOSPCT 1 11/25/2018   BASOPCT 1 11/25/2018    CMP:  Lab Results  Component Value Date   NA 138 11/26/2018   K 4.4 11/26/2018   CL 105 11/26/2018   CO2 23 11/26/2018   BUN 17 11/26/2018   CREATININE 0.78 11/26/2018   PROT 6.0 (L) 11/25/2018   ALBUMIN 3.6 11/25/2018   BILITOT 0.5 11/25/2018   ALKPHOS 47 11/25/2018   AST 35 11/25/2018   ALT 27 11/25/2018  .   Total Time in preparing paper work, data evaluation and todays exam - 30 minutes  Phillips Climes M.D on 11/29/2018 at 9:43 AM  [facility]   Office  [phone]

## 2018-11-29 NOTE — Care Management Important Message (Signed)
Important Message  Patient Details  Name: Renee Zuniga MRN: [id number] Date of Birth: 12-28-1930   Medicare Important Message Given:  Yes    Orbie Pyo 11/29/2018, 3:13 PM

## 2018-11-29 NOTE — Clinical Social Work Placement (Signed)
   CLINICAL SOCIAL WORK PLACEMENT  NOTE  Date:  11/29/2018  Patient Details  Name: Renee Zuniga MRN: [id number] Date of Birth: 01/17/1931  Clinical Social Work is seeking post-discharge placement for this patient at the [place] level of care (*CSW will initial, date and re-position this form in  chart as items are completed):  Yes   Patient/family provided with [facility] [place] Work Department's list of facilities offering this level of care within the geographic area requested by the patient (or if unable, by the patient's family).  Yes   Patient/family informed of their freedom to choose among providers that offer the needed level of care, that participate in Medicare, Medicaid or managed care program needed by the patient, have an available bed and are willing to accept the patient.  Yes   Patient/family informed of 's ownership interest in [facility] and [facility], as well as of the fact that they are under no obligation to receive care at these facilities.  PASRR submitted to EDS on 11/27/18     PASRR number received on 11/27/18     Existing PASRR number confirmed on       FL2 transmitted to all facilities in geographic area requested by pt/family on 11/27/18     FL2 transmitted to all facilities within larger geographic area on       Patient informed that his/her managed care company has contracts with or will negotiate with certain facilities, including the following:        Yes   Patient/family informed of bed offers received.  Patient chooses bed at [facility] recommends and patient chooses bed at      Patient to be transferred to [facility] [place] on 11/29/18.  Patient to be transferred to facility by [facility]     Patient family notified on 11/29/18 of transfer.  Name of family member notified:  Spouse and daughter at bedside     PHYSICIAN Please prepare priority discharge summary,  including medications, Please sign FL2     Additional Comment:    _______________________________________________ Benard Halsted, LCSW 11/29/2018, 8:52 AM

## 2018-11-29 NOTE — Progress Notes (Signed)
Physical Therapy Treatment Patient Details Name: Renee Zuniga MRN: [id number] DOB: [birth year] Today's Date: 11/29/2018    History of Present Illness 84 year old female with history of seizures, dementia, hypertension, hypothyroidism, who had a fall at home and presents with right groin pain.  In the ED she was found to have an inferior pubic rami fracture,    PT Comments    Pain limited the amount of activity pt able to tolerate.  Emphasis on transition to EOB, standing activity in the RW and transfer to the chair for sitting tolerance.   Follow Up Recommendations  SNF;Supervision/Assistance - 24 hour     Equipment Recommendations  None recommended by PT    Recommendations for Other Services       Precautions / Restrictions Restrictions Weight Bearing Restrictions: Yes RLE Weight Bearing: Weight bearing as tolerated LLE Weight Bearing: Weight bearing as tolerated    Mobility  Bed Mobility Overal bed mobility: Needs Assistance Bed Mobility: Supine to Sit     Supine to sit: Mod assist;+2 for physical assistance     General bed mobility comments: bridging to EOB with max +2 and increased pain.  Mod +2 to come forward to upright sitting.  Scooted to EOB with min guard  Transfers Overall transfer level: Needs assistance Equipment used: Rolling walker (2 wheeled) Transfers: Sit to/from [facility] Sit to Stand: Mod assist;Max assist Stand pivot transfers: Mod assist;+2 physical assistance       General transfer comment: cues for sequencing and assist for stability and maneuvering the RW.  More or a pivot than taking actual steps today.  Ambulation/Gait                 Stairs             Wheelchair Mobility    Modified Rankin (Stroke Patients Only)       Balance Overall balance assessment: History of Falls;Needs assistance   Sitting balance-Leahy Scale: Fair       Standing balance-Leahy Scale: Poor Standing balance  comment: reliant on the RW and external assist                            Cognition Arousal/Alertness: Awake/alert Behavior During Therapy: [facility] for tasks assessed/performed;Anxious Overall Cognitive Status: Within Functional Limits for tasks assessed                             Awareness: Emergent Problem Solving: Slow processing General Comments: NT formally      Exercises Other Exercises Other Exercises: gentle ROM to bil LE as warm up prior to mobility    General Comments        Pertinent Vitals/Pain Pain Assessment: Faces Faces Pain Scale: Hurts whole lot Pain Location: right groin Pain Descriptors / Indicators: Discomfort;Grimacing;Guarding;Sharp Pain Intervention(s): Monitored during session;Limited activity within patient's tolerance    Home Living                      Prior Function            PT Goals (current goals can now be found in the care plan section) Acute Rehab PT Goals Patient Stated Goal: to go home (go to party on the 28th) PT Goal Formulation: With patient/family Time For Goal Achievement: 12/10/18 Potential to Achieve Goals: Good Progress towards PT goals: Progressing toward goals    Frequency  Min 3X/week      PT Plan Current plan remains appropriate    Co-evaluation              AM-PAC PT "6 Clicks" Mobility   Outcome Measure  Help needed turning from your back to your side while in a flat bed without using bedrails?: A Lot Help needed moving from lying on your back to sitting on the side of a flat bed without using bedrails?: A Lot Help needed moving to and from a bed to a chair (including a wheelchair)?: A Lot Help needed standing up from a chair using your arms (e.g., wheelchair or bedside chair)?: A Lot Help needed to walk in hospital room?: A Lot Help needed climbing 3-5 steps with a railing? : Total 6 Click Score: 11    End of Session   Activity Tolerance: Patient limited by  pain Patient left: in chair;with call bell/phone within reach;with family/visitor present;with chair alarm set Nurse Communication: Mobility status PT Visit Diagnosis: Repeated falls (R29.6);History of falling (Z91.81);Difficulty in walking, not elsewhere classified (R26.2);Pain Pain - part of body: (pelvis)     Time: 5929-2446 PT Time Calculation (min) (ACUTE ONLY): 23 min  Charges:  $Therapeutic Activity: 23-37 mins                     11/29/2018  Donnella Sham, PT Acute Rehabilitation Services [phone]  (pager) [phone]  (office)   Tessie Fass Maico [place] 11/29/2018, 5:36 PM

## 2018-11-29 NOTE — Consult Note (Signed)
[facility] Primary Care Navigator  11/29/2018  Renee Zuniga 28-Sep-1931 [id number]   Met withpatient, husband (Don)and daughters ([name] and Jeri Jeanbaptiste) at the bedsideto identify possible discharge needs. Daughter reports thatpatient "fell at home and fractured her pelvis" which resulted to this admission (right-sided superior and inferior pubic rami fracture). Orthopedic surgery consulted with recommendation for non-operative management and follow with orthopedic as an outpatient in 10 days (family aware).  Patientendorses Dr.Ronald Polite with [facility] as theprimary care provider.    [facility].  Daughterreports thatpatienthas beenmanagingher own medications at [employer] use of "pillbox" system filled once every 2 weeks. Daughter mentioned that they are looking at having the pharmacy to have her medications on "blister packs" for easier management.  Daughterverbalized thatpatient's husband was driving and providingtransportationtoherdoctors' appointments.  Patientlives at home with husband who serves as the primary caregiver for her.   Anticipateddischargeplan isskilled nursing facility per therapy recommendation (SNF-[place]) prior to returning home.   Patientand family voiced understanding to call primary care provider's office whenshegetsback home, for a post discharge follow-up within1- 2weeksor sooner if needs arise. Patient letter (with PCP's contact number) was provided astheirreminder.   Discussed with patientand family regarding [facility] CM services available for health managementand resourcesat homeand have indicated being able to manage her health conditions so far. Patient has good support from family and they are knowledgeable of ways to assist her in managing health needs. Plan in place to monitor/  record patient's blood pressure at home and bring results to her doctor's appointments.   Patient and family verbalizedunderstandingto seek referral from primary care provider to [facility] care management ifdeemed necessary and appropriatefor services in the nearfuture-when patient getsback home.  [facility] care management information was provided for future needsthatpatientmay have.  Primary care provider's office is listed as providing transition of care (TOC) follow-up.   For additional questions please contact:  Edwena Felty A. Florentina Marquart, BSN, RN-BC [facility] PRIMARY CARE Navigator Cell: [phone]

## 2018-12-02 DIAGNOSIS — S32591D Other specified fracture of right pubis, subsequent encounter for fracture with routine healing: Secondary | ICD-10-CM | POA: Diagnosis not present

## 2018-12-02 DIAGNOSIS — I10 Essential (primary) hypertension: Secondary | ICD-10-CM | POA: Diagnosis not present

## 2018-12-02 DIAGNOSIS — F0390 Unspecified dementia without behavioral disturbance: Secondary | ICD-10-CM | POA: Diagnosis not present

## 2018-12-02 DIAGNOSIS — G40909 Epilepsy, unspecified, not intractable, without status epilepticus: Secondary | ICD-10-CM | POA: Diagnosis not present

## 2018-12-12 DIAGNOSIS — R2689 Other abnormalities of gait and mobility: Secondary | ICD-10-CM | POA: Diagnosis not present

## 2018-12-12 DIAGNOSIS — F0280 Dementia in other diseases classified elsewhere without behavioral disturbance: Secondary | ICD-10-CM | POA: Diagnosis not present

## 2018-12-12 DIAGNOSIS — S3289XA Fracture of other parts of pelvis, initial encounter for closed fracture: Secondary | ICD-10-CM | POA: Diagnosis not present

## 2018-12-12 DIAGNOSIS — M25551 Pain in right hip: Secondary | ICD-10-CM | POA: Diagnosis not present

## 2018-12-29 DIAGNOSIS — S3282XD Multiple fractures of pelvis without disruption of pelvic ring, subsequent encounter for fracture with routine healing: Secondary | ICD-10-CM | POA: Diagnosis not present

## 2018-12-29 DIAGNOSIS — E7800 Pure hypercholesterolemia, unspecified: Secondary | ICD-10-CM | POA: Diagnosis not present

## 2018-12-29 DIAGNOSIS — I10 Essential (primary) hypertension: Secondary | ICD-10-CM | POA: Diagnosis not present

## 2018-12-29 DIAGNOSIS — R569 Unspecified convulsions: Secondary | ICD-10-CM | POA: Diagnosis not present

## 2018-12-29 DIAGNOSIS — W19XXXD Unspecified fall, subsequent encounter: Secondary | ICD-10-CM | POA: Diagnosis not present

## 2018-12-29 DIAGNOSIS — I341 Nonrheumatic mitral (valve) prolapse: Secondary | ICD-10-CM | POA: Diagnosis not present

## 2018-12-29 DIAGNOSIS — F0390 Unspecified dementia without behavioral disturbance: Secondary | ICD-10-CM | POA: Diagnosis not present

## 2018-12-29 DIAGNOSIS — M1991 Primary osteoarthritis, unspecified site: Secondary | ICD-10-CM | POA: Diagnosis not present

## 2018-12-29 DIAGNOSIS — D649 Anemia, unspecified: Secondary | ICD-10-CM | POA: Diagnosis not present

## 2018-12-29 DIAGNOSIS — S329XXD Fracture of unspecified parts of lumbosacral spine and pelvis, subsequent encounter for fracture with routine healing: Secondary | ICD-10-CM | POA: Diagnosis not present

## 2018-12-29 DIAGNOSIS — E039 Hypothyroidism, unspecified: Secondary | ICD-10-CM | POA: Diagnosis not present

## 2018-12-30 DIAGNOSIS — I10 Essential (primary) hypertension: Secondary | ICD-10-CM | POA: Diagnosis not present

## 2018-12-30 DIAGNOSIS — R569 Unspecified convulsions: Secondary | ICD-10-CM | POA: Diagnosis not present

## 2018-12-30 DIAGNOSIS — M1991 Primary osteoarthritis, unspecified site: Secondary | ICD-10-CM | POA: Diagnosis not present

## 2018-12-30 DIAGNOSIS — S329XXD Fracture of unspecified parts of lumbosacral spine and pelvis, subsequent encounter for fracture with routine healing: Secondary | ICD-10-CM | POA: Diagnosis not present

## 2018-12-30 DIAGNOSIS — F0390 Unspecified dementia without behavioral disturbance: Secondary | ICD-10-CM | POA: Diagnosis not present

## 2018-12-30 DIAGNOSIS — S3282XD Multiple fractures of pelvis without disruption of pelvic ring, subsequent encounter for fracture with routine healing: Secondary | ICD-10-CM | POA: Diagnosis not present

## 2019-01-01 DIAGNOSIS — S3282XD Multiple fractures of pelvis without disruption of pelvic ring, subsequent encounter for fracture with routine healing: Secondary | ICD-10-CM | POA: Diagnosis not present

## 2019-01-01 DIAGNOSIS — M1991 Primary osteoarthritis, unspecified site: Secondary | ICD-10-CM | POA: Diagnosis not present

## 2019-01-01 DIAGNOSIS — I10 Essential (primary) hypertension: Secondary | ICD-10-CM | POA: Diagnosis not present

## 2019-01-01 DIAGNOSIS — R569 Unspecified convulsions: Secondary | ICD-10-CM | POA: Diagnosis not present

## 2019-01-01 DIAGNOSIS — F0390 Unspecified dementia without behavioral disturbance: Secondary | ICD-10-CM | POA: Diagnosis not present

## 2019-01-01 DIAGNOSIS — S329XXD Fracture of unspecified parts of lumbosacral spine and pelvis, subsequent encounter for fracture with routine healing: Secondary | ICD-10-CM | POA: Diagnosis not present

## 2019-01-06 DIAGNOSIS — S3282XD Multiple fractures of pelvis without disruption of pelvic ring, subsequent encounter for fracture with routine healing: Secondary | ICD-10-CM | POA: Diagnosis not present

## 2019-01-06 DIAGNOSIS — R413 Other amnesia: Secondary | ICD-10-CM | POA: Diagnosis not present

## 2019-01-06 DIAGNOSIS — S329XXD Fracture of unspecified parts of lumbosacral spine and pelvis, subsequent encounter for fracture with routine healing: Secondary | ICD-10-CM | POA: Diagnosis not present

## 2019-01-06 DIAGNOSIS — Z66 Do not resuscitate: Secondary | ICD-10-CM | POA: Diagnosis not present

## 2019-01-06 DIAGNOSIS — R269 Unspecified abnormalities of gait and mobility: Secondary | ICD-10-CM | POA: Diagnosis not present

## 2019-01-06 DIAGNOSIS — N183 Chronic kidney disease, stage 3 (moderate): Secondary | ICD-10-CM | POA: Diagnosis not present

## 2019-01-06 DIAGNOSIS — S32509D Unspecified fracture of unspecified pubis, subsequent encounter for fracture with routine healing: Secondary | ICD-10-CM | POA: Diagnosis not present

## 2019-01-06 DIAGNOSIS — M1991 Primary osteoarthritis, unspecified site: Secondary | ICD-10-CM | POA: Diagnosis not present

## 2019-01-06 DIAGNOSIS — I10 Essential (primary) hypertension: Secondary | ICD-10-CM | POA: Diagnosis not present

## 2019-01-06 DIAGNOSIS — R569 Unspecified convulsions: Secondary | ICD-10-CM | POA: Diagnosis not present

## 2019-01-06 DIAGNOSIS — F0390 Unspecified dementia without behavioral disturbance: Secondary | ICD-10-CM | POA: Diagnosis not present

## 2019-01-07 DIAGNOSIS — S329XXD Fracture of unspecified parts of lumbosacral spine and pelvis, subsequent encounter for fracture with routine healing: Secondary | ICD-10-CM | POA: Diagnosis not present

## 2019-01-07 DIAGNOSIS — F0390 Unspecified dementia without behavioral disturbance: Secondary | ICD-10-CM | POA: Diagnosis not present

## 2019-01-07 DIAGNOSIS — I10 Essential (primary) hypertension: Secondary | ICD-10-CM | POA: Diagnosis not present

## 2019-01-07 DIAGNOSIS — S3282XD Multiple fractures of pelvis without disruption of pelvic ring, subsequent encounter for fracture with routine healing: Secondary | ICD-10-CM | POA: Diagnosis not present

## 2019-01-07 DIAGNOSIS — R569 Unspecified convulsions: Secondary | ICD-10-CM | POA: Diagnosis not present

## 2019-01-07 DIAGNOSIS — M1991 Primary osteoarthritis, unspecified site: Secondary | ICD-10-CM | POA: Diagnosis not present

## 2019-01-07 DIAGNOSIS — S32591A Other specified fracture of right pubis, initial encounter for closed fracture: Secondary | ICD-10-CM | POA: Diagnosis not present

## 2019-01-08 DIAGNOSIS — F0390 Unspecified dementia without behavioral disturbance: Secondary | ICD-10-CM | POA: Diagnosis not present

## 2019-01-08 DIAGNOSIS — I10 Essential (primary) hypertension: Secondary | ICD-10-CM | POA: Diagnosis not present

## 2019-01-08 DIAGNOSIS — S329XXD Fracture of unspecified parts of lumbosacral spine and pelvis, subsequent encounter for fracture with routine healing: Secondary | ICD-10-CM | POA: Diagnosis not present

## 2019-01-08 DIAGNOSIS — M1991 Primary osteoarthritis, unspecified site: Secondary | ICD-10-CM | POA: Diagnosis not present

## 2019-01-08 DIAGNOSIS — R569 Unspecified convulsions: Secondary | ICD-10-CM | POA: Diagnosis not present

## 2019-01-08 DIAGNOSIS — S3282XD Multiple fractures of pelvis without disruption of pelvic ring, subsequent encounter for fracture with routine healing: Secondary | ICD-10-CM | POA: Diagnosis not present

## 2019-01-09 DIAGNOSIS — F0390 Unspecified dementia without behavioral disturbance: Secondary | ICD-10-CM | POA: Diagnosis not present

## 2019-01-09 DIAGNOSIS — I10 Essential (primary) hypertension: Secondary | ICD-10-CM | POA: Diagnosis not present

## 2019-01-09 DIAGNOSIS — S3282XD Multiple fractures of pelvis without disruption of pelvic ring, subsequent encounter for fracture with routine healing: Secondary | ICD-10-CM | POA: Diagnosis not present

## 2019-01-09 DIAGNOSIS — S329XXD Fracture of unspecified parts of lumbosacral spine and pelvis, subsequent encounter for fracture with routine healing: Secondary | ICD-10-CM | POA: Diagnosis not present

## 2019-01-09 DIAGNOSIS — M1991 Primary osteoarthritis, unspecified site: Secondary | ICD-10-CM | POA: Diagnosis not present

## 2019-01-09 DIAGNOSIS — R569 Unspecified convulsions: Secondary | ICD-10-CM | POA: Diagnosis not present

## 2019-01-13 DIAGNOSIS — I10 Essential (primary) hypertension: Secondary | ICD-10-CM | POA: Diagnosis not present

## 2019-01-13 DIAGNOSIS — M1991 Primary osteoarthritis, unspecified site: Secondary | ICD-10-CM | POA: Diagnosis not present

## 2019-01-13 DIAGNOSIS — R569 Unspecified convulsions: Secondary | ICD-10-CM | POA: Diagnosis not present

## 2019-01-13 DIAGNOSIS — S3282XD Multiple fractures of pelvis without disruption of pelvic ring, subsequent encounter for fracture with routine healing: Secondary | ICD-10-CM | POA: Diagnosis not present

## 2019-01-13 DIAGNOSIS — S329XXD Fracture of unspecified parts of lumbosacral spine and pelvis, subsequent encounter for fracture with routine healing: Secondary | ICD-10-CM | POA: Diagnosis not present

## 2019-01-13 DIAGNOSIS — F0390 Unspecified dementia without behavioral disturbance: Secondary | ICD-10-CM | POA: Diagnosis not present

## 2019-01-14 DIAGNOSIS — F0390 Unspecified dementia without behavioral disturbance: Secondary | ICD-10-CM | POA: Diagnosis not present

## 2019-01-14 DIAGNOSIS — S329XXD Fracture of unspecified parts of lumbosacral spine and pelvis, subsequent encounter for fracture with routine healing: Secondary | ICD-10-CM | POA: Diagnosis not present

## 2019-01-14 DIAGNOSIS — R569 Unspecified convulsions: Secondary | ICD-10-CM | POA: Diagnosis not present

## 2019-01-14 DIAGNOSIS — M1991 Primary osteoarthritis, unspecified site: Secondary | ICD-10-CM | POA: Diagnosis not present

## 2019-01-14 DIAGNOSIS — S3282XD Multiple fractures of pelvis without disruption of pelvic ring, subsequent encounter for fracture with routine healing: Secondary | ICD-10-CM | POA: Diagnosis not present

## 2019-01-14 DIAGNOSIS — I10 Essential (primary) hypertension: Secondary | ICD-10-CM | POA: Diagnosis not present

## 2019-01-15 DIAGNOSIS — F0390 Unspecified dementia without behavioral disturbance: Secondary | ICD-10-CM | POA: Diagnosis not present

## 2019-01-15 DIAGNOSIS — S3282XD Multiple fractures of pelvis without disruption of pelvic ring, subsequent encounter for fracture with routine healing: Secondary | ICD-10-CM | POA: Diagnosis not present

## 2019-01-15 DIAGNOSIS — I10 Essential (primary) hypertension: Secondary | ICD-10-CM | POA: Diagnosis not present

## 2019-01-15 DIAGNOSIS — S329XXD Fracture of unspecified parts of lumbosacral spine and pelvis, subsequent encounter for fracture with routine healing: Secondary | ICD-10-CM | POA: Diagnosis not present

## 2019-01-15 DIAGNOSIS — R569 Unspecified convulsions: Secondary | ICD-10-CM | POA: Diagnosis not present

## 2019-01-15 DIAGNOSIS — M1991 Primary osteoarthritis, unspecified site: Secondary | ICD-10-CM | POA: Diagnosis not present

## 2019-01-15 IMAGING — DX DG HIP (WITH OR WITHOUT PELVIS) 2-3V*R*
3 series · 3 of 3 positions shown · non-contrast
Comparison: None.

CLINICAL DATA: Right hip pain after fall.

EXAM:
DG HIP (WITH OR WITHOUT PELVIS) 2-3V RIGHT

[pelvis ap]
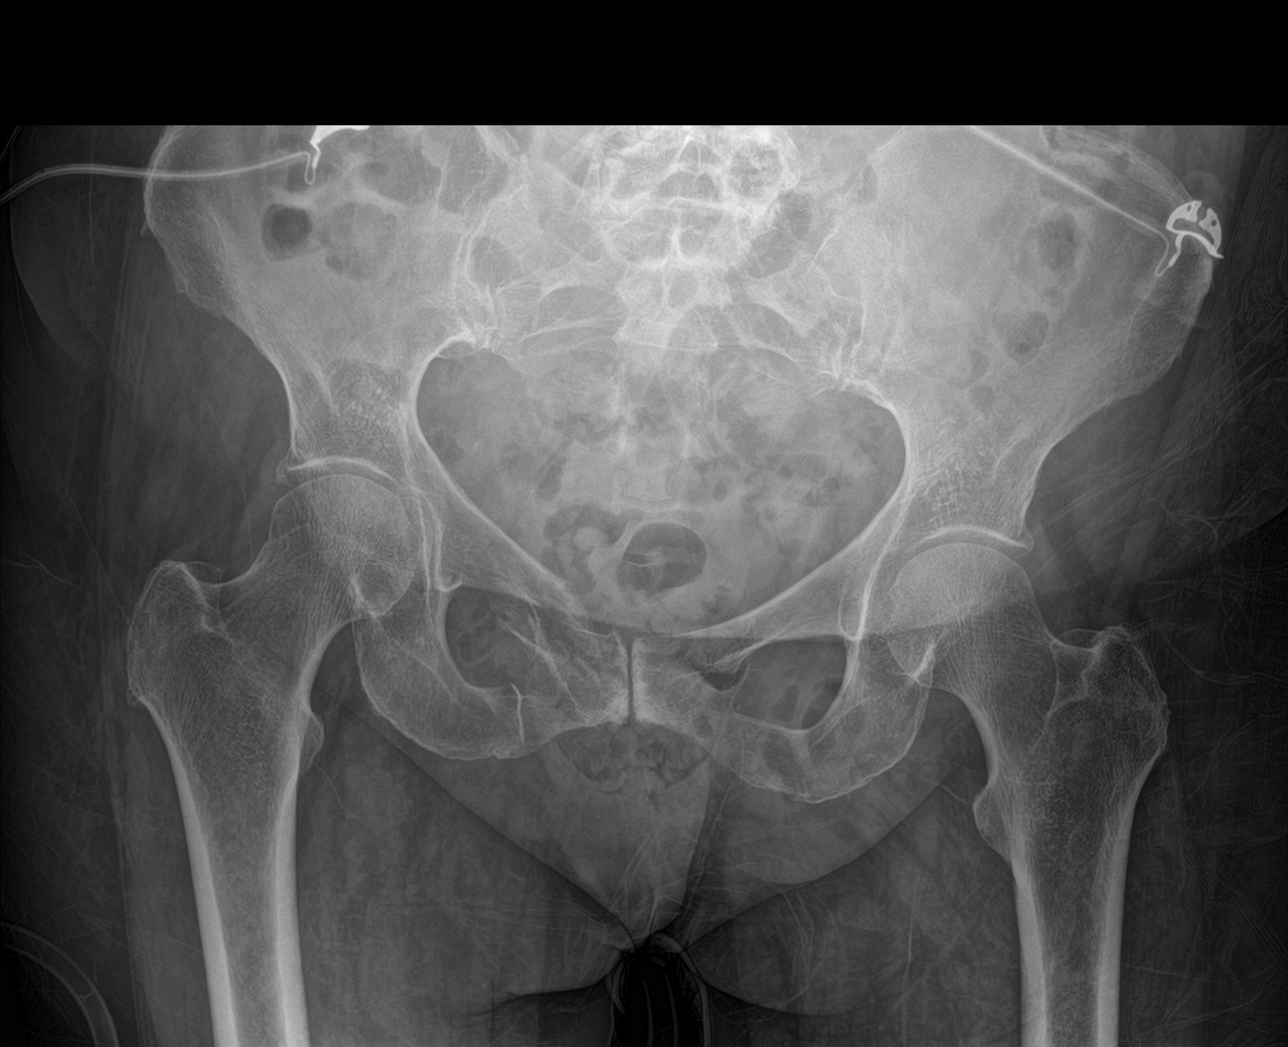

[hip ap]
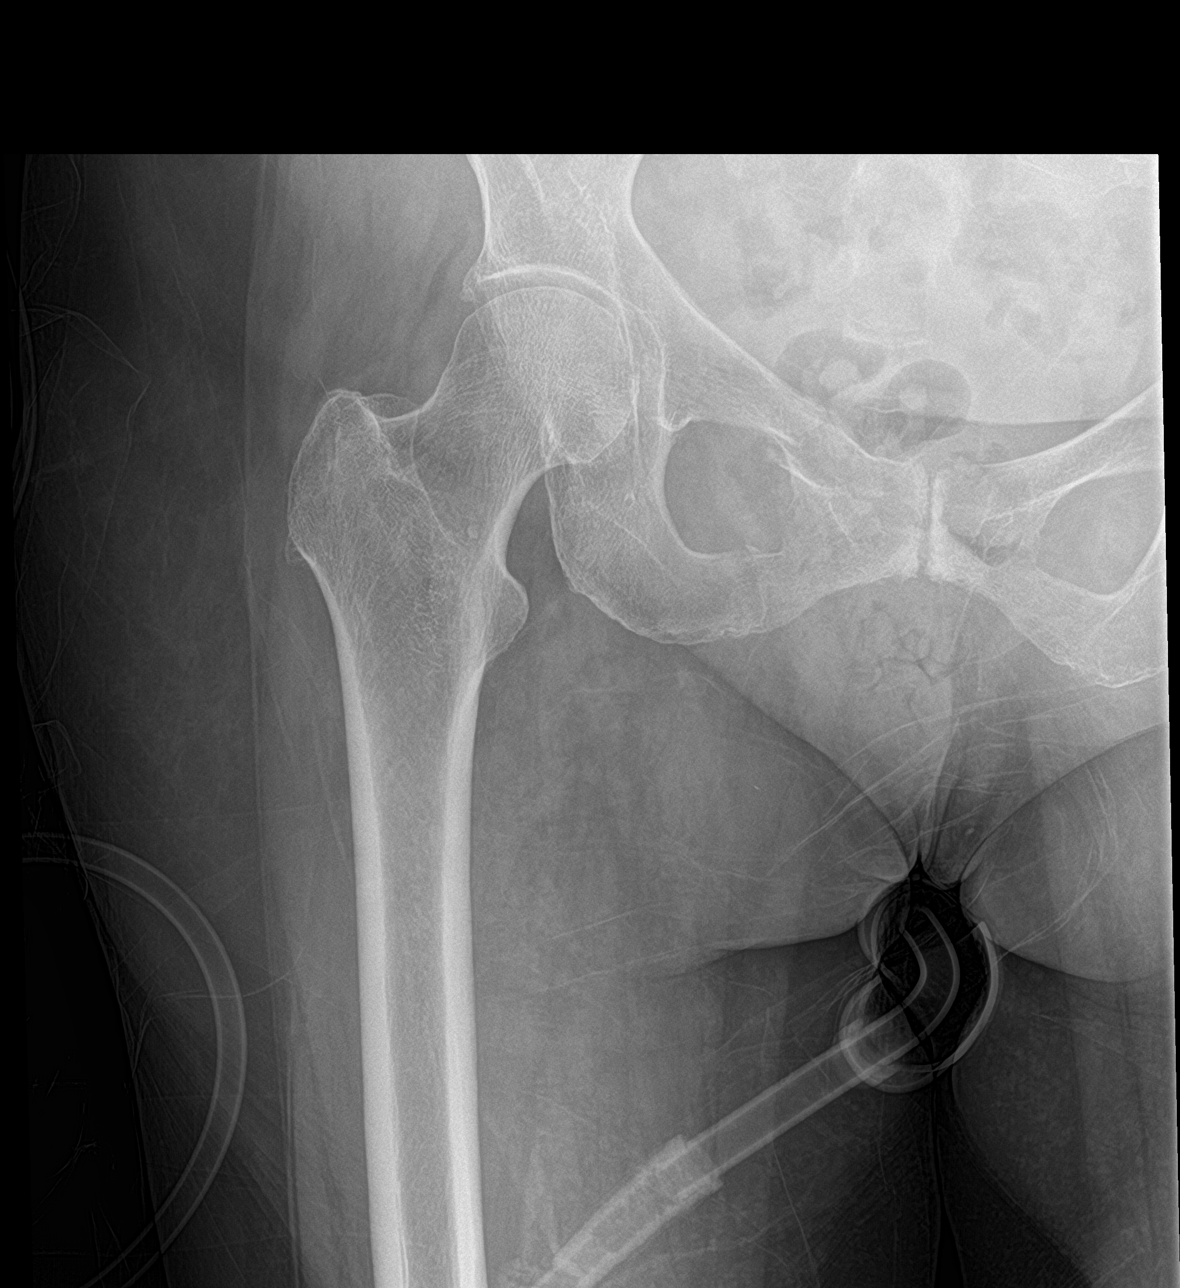

[hip lat]
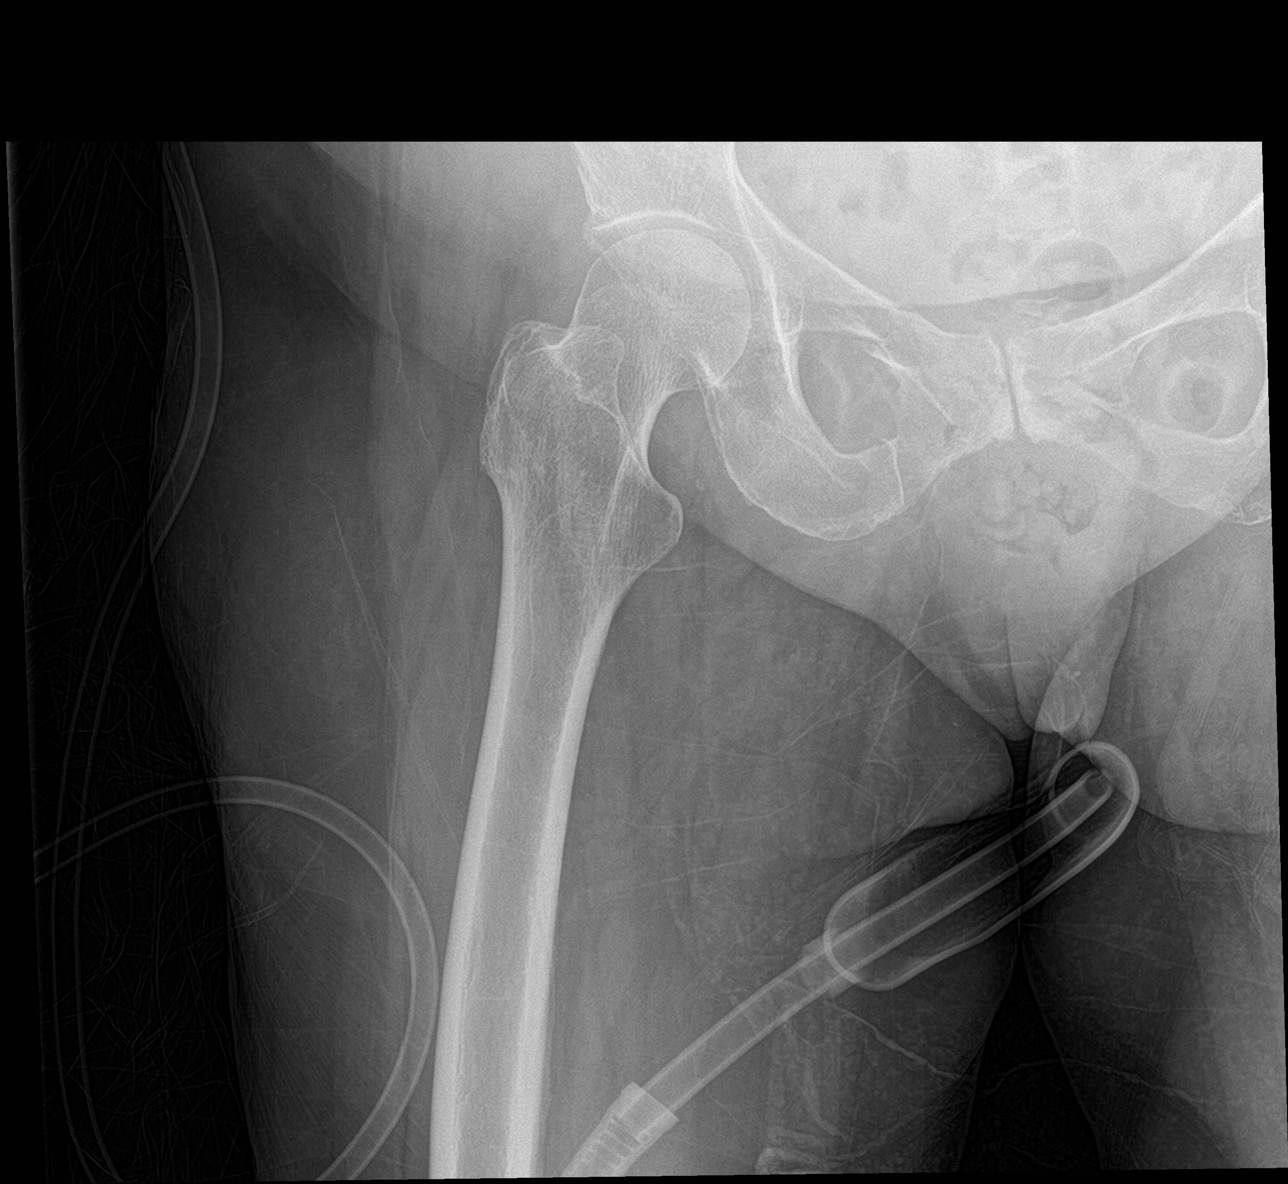

[3 of 3 positions shown; findings below may reference images not displayed]

FINDINGS: Mildly displaced fractures of the right superior and inferior pubic
rami. No femur fracture. Hip joint spaces are relatively preserved.
The pubic symphysis and sacroiliac joints are intact. Osteopenia.
Degenerative changes of the lower lumbar spine.
IMPRESSION: 1. Mildly displaced fractures of the right superior and inferior
pubic rami.

## 2019-01-20 DIAGNOSIS — F0390 Unspecified dementia without behavioral disturbance: Secondary | ICD-10-CM | POA: Diagnosis not present

## 2019-01-20 DIAGNOSIS — R569 Unspecified convulsions: Secondary | ICD-10-CM | POA: Diagnosis not present

## 2019-01-20 DIAGNOSIS — S329XXD Fracture of unspecified parts of lumbosacral spine and pelvis, subsequent encounter for fracture with routine healing: Secondary | ICD-10-CM | POA: Diagnosis not present

## 2019-01-20 DIAGNOSIS — S3282XD Multiple fractures of pelvis without disruption of pelvic ring, subsequent encounter for fracture with routine healing: Secondary | ICD-10-CM | POA: Diagnosis not present

## 2019-01-20 DIAGNOSIS — M1991 Primary osteoarthritis, unspecified site: Secondary | ICD-10-CM | POA: Diagnosis not present

## 2019-01-20 DIAGNOSIS — I10 Essential (primary) hypertension: Secondary | ICD-10-CM | POA: Diagnosis not present

## 2019-01-22 DIAGNOSIS — M1991 Primary osteoarthritis, unspecified site: Secondary | ICD-10-CM | POA: Diagnosis not present

## 2019-01-22 DIAGNOSIS — I10 Essential (primary) hypertension: Secondary | ICD-10-CM | POA: Diagnosis not present

## 2019-01-22 DIAGNOSIS — F0390 Unspecified dementia without behavioral disturbance: Secondary | ICD-10-CM | POA: Diagnosis not present

## 2019-01-22 DIAGNOSIS — S3282XD Multiple fractures of pelvis without disruption of pelvic ring, subsequent encounter for fracture with routine healing: Secondary | ICD-10-CM | POA: Diagnosis not present

## 2019-01-22 DIAGNOSIS — S329XXD Fracture of unspecified parts of lumbosacral spine and pelvis, subsequent encounter for fracture with routine healing: Secondary | ICD-10-CM | POA: Diagnosis not present

## 2019-01-22 DIAGNOSIS — R569 Unspecified convulsions: Secondary | ICD-10-CM | POA: Diagnosis not present

## 2019-01-28 DIAGNOSIS — S3282XD Multiple fractures of pelvis without disruption of pelvic ring, subsequent encounter for fracture with routine healing: Secondary | ICD-10-CM | POA: Diagnosis not present

## 2019-01-28 DIAGNOSIS — S32591D Other specified fracture of right pubis, subsequent encounter for fracture with routine healing: Secondary | ICD-10-CM | POA: Diagnosis not present

## 2019-01-28 DIAGNOSIS — S329XXD Fracture of unspecified parts of lumbosacral spine and pelvis, subsequent encounter for fracture with routine healing: Secondary | ICD-10-CM | POA: Diagnosis not present

## 2019-01-28 DIAGNOSIS — E7800 Pure hypercholesterolemia, unspecified: Secondary | ICD-10-CM | POA: Diagnosis not present

## 2019-01-28 DIAGNOSIS — F0390 Unspecified dementia without behavioral disturbance: Secondary | ICD-10-CM | POA: Diagnosis not present

## 2019-01-28 DIAGNOSIS — I10 Essential (primary) hypertension: Secondary | ICD-10-CM | POA: Diagnosis not present

## 2019-01-28 DIAGNOSIS — M170 Bilateral primary osteoarthritis of knee: Secondary | ICD-10-CM | POA: Diagnosis not present

## 2019-01-28 DIAGNOSIS — D649 Anemia, unspecified: Secondary | ICD-10-CM | POA: Diagnosis not present

## 2019-01-28 DIAGNOSIS — E039 Hypothyroidism, unspecified: Secondary | ICD-10-CM | POA: Diagnosis not present

## 2019-01-28 DIAGNOSIS — R569 Unspecified convulsions: Secondary | ICD-10-CM | POA: Diagnosis not present

## 2019-01-28 DIAGNOSIS — W19XXXD Unspecified fall, subsequent encounter: Secondary | ICD-10-CM | POA: Diagnosis not present

## 2019-01-28 DIAGNOSIS — I341 Nonrheumatic mitral (valve) prolapse: Secondary | ICD-10-CM | POA: Diagnosis not present

## 2019-01-28 DIAGNOSIS — M1991 Primary osteoarthritis, unspecified site: Secondary | ICD-10-CM | POA: Diagnosis not present

## 2019-01-29 DIAGNOSIS — M1991 Primary osteoarthritis, unspecified site: Secondary | ICD-10-CM | POA: Diagnosis not present

## 2019-01-29 DIAGNOSIS — S3282XD Multiple fractures of pelvis without disruption of pelvic ring, subsequent encounter for fracture with routine healing: Secondary | ICD-10-CM | POA: Diagnosis not present

## 2019-01-29 DIAGNOSIS — S329XXD Fracture of unspecified parts of lumbosacral spine and pelvis, subsequent encounter for fracture with routine healing: Secondary | ICD-10-CM | POA: Diagnosis not present

## 2019-01-29 DIAGNOSIS — F0390 Unspecified dementia without behavioral disturbance: Secondary | ICD-10-CM | POA: Diagnosis not present

## 2019-01-29 DIAGNOSIS — I10 Essential (primary) hypertension: Secondary | ICD-10-CM | POA: Diagnosis not present

## 2019-01-29 DIAGNOSIS — R569 Unspecified convulsions: Secondary | ICD-10-CM | POA: Diagnosis not present

## 2019-02-03 DIAGNOSIS — S329XXD Fracture of unspecified parts of lumbosacral spine and pelvis, subsequent encounter for fracture with routine healing: Secondary | ICD-10-CM | POA: Diagnosis not present

## 2019-02-03 DIAGNOSIS — S3282XD Multiple fractures of pelvis without disruption of pelvic ring, subsequent encounter for fracture with routine healing: Secondary | ICD-10-CM | POA: Diagnosis not present

## 2019-02-03 DIAGNOSIS — M1991 Primary osteoarthritis, unspecified site: Secondary | ICD-10-CM | POA: Diagnosis not present

## 2019-02-03 DIAGNOSIS — F0390 Unspecified dementia without behavioral disturbance: Secondary | ICD-10-CM | POA: Diagnosis not present

## 2019-02-03 DIAGNOSIS — I10 Essential (primary) hypertension: Secondary | ICD-10-CM | POA: Diagnosis not present

## 2019-02-03 DIAGNOSIS — R569 Unspecified convulsions: Secondary | ICD-10-CM | POA: Diagnosis not present

## 2019-02-06 DIAGNOSIS — I10 Essential (primary) hypertension: Secondary | ICD-10-CM | POA: Diagnosis not present

## 2019-02-06 DIAGNOSIS — S3282XD Multiple fractures of pelvis without disruption of pelvic ring, subsequent encounter for fracture with routine healing: Secondary | ICD-10-CM | POA: Diagnosis not present

## 2019-02-06 DIAGNOSIS — M1991 Primary osteoarthritis, unspecified site: Secondary | ICD-10-CM | POA: Diagnosis not present

## 2019-02-06 DIAGNOSIS — F0390 Unspecified dementia without behavioral disturbance: Secondary | ICD-10-CM | POA: Diagnosis not present

## 2019-02-06 DIAGNOSIS — R569 Unspecified convulsions: Secondary | ICD-10-CM | POA: Diagnosis not present

## 2019-02-06 DIAGNOSIS — S329XXD Fracture of unspecified parts of lumbosacral spine and pelvis, subsequent encounter for fracture with routine healing: Secondary | ICD-10-CM | POA: Diagnosis not present

## 2019-02-13 DIAGNOSIS — M19072 Primary osteoarthritis, left ankle and foot: Secondary | ICD-10-CM | POA: Diagnosis not present

## 2019-02-13 DIAGNOSIS — M76822 Posterior tibial tendinitis, left leg: Secondary | ICD-10-CM | POA: Diagnosis not present

## 2019-02-20 DIAGNOSIS — S32591D Other specified fracture of right pubis, subsequent encounter for fracture with routine healing: Secondary | ICD-10-CM | POA: Diagnosis not present

## 2019-04-02 DIAGNOSIS — N183 Chronic kidney disease, stage 3 (moderate): Secondary | ICD-10-CM | POA: Diagnosis not present

## 2019-04-02 DIAGNOSIS — E785 Hyperlipidemia, unspecified: Secondary | ICD-10-CM | POA: Diagnosis not present

## 2019-04-02 DIAGNOSIS — M810 Age-related osteoporosis without current pathological fracture: Secondary | ICD-10-CM | POA: Diagnosis not present

## 2019-04-02 DIAGNOSIS — I10 Essential (primary) hypertension: Secondary | ICD-10-CM | POA: Diagnosis not present

## 2019-04-02 DIAGNOSIS — E039 Hypothyroidism, unspecified: Secondary | ICD-10-CM | POA: Diagnosis not present

## 2019-04-02 DIAGNOSIS — M1990 Unspecified osteoarthritis, unspecified site: Secondary | ICD-10-CM | POA: Diagnosis not present

## 2019-04-14 DIAGNOSIS — N183 Chronic kidney disease, stage 3 (moderate): Secondary | ICD-10-CM | POA: Diagnosis not present

## 2019-04-14 DIAGNOSIS — M810 Age-related osteoporosis without current pathological fracture: Secondary | ICD-10-CM | POA: Diagnosis not present

## 2019-04-14 DIAGNOSIS — G2581 Restless legs syndrome: Secondary | ICD-10-CM | POA: Diagnosis not present

## 2019-04-14 DIAGNOSIS — H6122 Impacted cerumen, left ear: Secondary | ICD-10-CM | POA: Diagnosis not present

## 2019-04-14 DIAGNOSIS — G40909 Epilepsy, unspecified, not intractable, without status epilepticus: Secondary | ICD-10-CM | POA: Diagnosis not present

## 2019-04-14 DIAGNOSIS — R413 Other amnesia: Secondary | ICD-10-CM | POA: Diagnosis not present

## 2019-04-14 DIAGNOSIS — E7800 Pure hypercholesterolemia, unspecified: Secondary | ICD-10-CM | POA: Diagnosis not present

## 2019-05-13 DIAGNOSIS — H9312 Tinnitus, left ear: Secondary | ICD-10-CM | POA: Diagnosis not present

## 2019-05-13 DIAGNOSIS — H6121 Impacted cerumen, right ear: Secondary | ICD-10-CM | POA: Diagnosis not present

## 2019-05-13 DIAGNOSIS — Z7289 Other problems related to lifestyle: Secondary | ICD-10-CM | POA: Diagnosis not present

## 2019-05-15 DIAGNOSIS — H524 Presbyopia: Secondary | ICD-10-CM | POA: Diagnosis not present

## 2019-05-15 DIAGNOSIS — H5203 Hypermetropia, bilateral: Secondary | ICD-10-CM | POA: Diagnosis not present

## 2019-05-15 DIAGNOSIS — H2513 Age-related nuclear cataract, bilateral: Secondary | ICD-10-CM | POA: Diagnosis not present

## 2019-06-09 ENCOUNTER — Other Ambulatory Visit: Payer: Self-pay | Admitting: Internal Medicine

## 2019-06-09 DIAGNOSIS — Z1231 Encounter for screening mammogram for malignant neoplasm of breast: Secondary | ICD-10-CM

## 2019-06-25 DIAGNOSIS — L821 Other seborrheic keratosis: Secondary | ICD-10-CM | POA: Diagnosis not present

## 2019-06-25 DIAGNOSIS — L570 Actinic keratosis: Secondary | ICD-10-CM | POA: Diagnosis not present

## 2019-06-30 DIAGNOSIS — M170 Bilateral primary osteoarthritis of knee: Secondary | ICD-10-CM | POA: Diagnosis not present

## 2019-07-14 DIAGNOSIS — M170 Bilateral primary osteoarthritis of knee: Secondary | ICD-10-CM | POA: Diagnosis not present

## 2019-07-18 ENCOUNTER — Ambulatory Visit
Admission: RE | Admit: 2019-07-18 | Discharge: 2019-07-18 | Disposition: A | Discharge status: Home / Self Care | Payer: Medicare Other | Source: Ambulatory Visit | Attending: Internal Medicine | Admitting: Internal Medicine

## 2019-07-18 ENCOUNTER — Other Ambulatory Visit: Payer: Self-pay

## 2019-07-18 DIAGNOSIS — Z1231 Encounter for screening mammogram for malignant neoplasm of breast: Secondary | ICD-10-CM

## 2019-08-13 DIAGNOSIS — S61459A Open bite of unspecified hand, initial encounter: Secondary | ICD-10-CM | POA: Diagnosis not present

## 2019-08-13 DIAGNOSIS — T148XXA Other injury of unspecified body region, initial encounter: Secondary | ICD-10-CM | POA: Diagnosis not present

## 2019-08-13 DIAGNOSIS — S61452A Open bite of left hand, initial encounter: Secondary | ICD-10-CM | POA: Diagnosis not present

## 2019-08-13 DIAGNOSIS — L03012 Cellulitis of left finger: Secondary | ICD-10-CM | POA: Diagnosis not present

## 2019-09-10 DIAGNOSIS — Z23 Encounter for immunization: Secondary | ICD-10-CM | POA: Diagnosis not present

## 2019-09-23 DIAGNOSIS — L570 Actinic keratosis: Secondary | ICD-10-CM | POA: Diagnosis not present

## 2019-10-16 ENCOUNTER — Encounter: Payer: Self-pay | Admitting: Neurology

## 2019-10-16 ENCOUNTER — Ambulatory Visit (INDEPENDENT_AMBULATORY_CARE_PROVIDER_SITE_OTHER): Payer: Medicare Other | Admitting: Neurology

## 2019-10-16 ENCOUNTER — Other Ambulatory Visit: Payer: Self-pay

## 2019-10-16 ENCOUNTER — Encounter

## 2019-10-16 VITALS — Ht 65.0 in | Wt 145.0 lb

## 2019-10-16 DIAGNOSIS — F0281 Dementia in other diseases classified elsewhere with behavioral disturbance: Secondary | ICD-10-CM

## 2019-10-16 DIAGNOSIS — G40009 Localization-related (focal) (partial) idiopathic epilepsy and epileptic syndromes with seizures of localized onset, not intractable, without status epilepticus: Secondary | ICD-10-CM

## 2019-10-16 DIAGNOSIS — G301 Alzheimer's disease with late onset: Secondary | ICD-10-CM | POA: Diagnosis not present

## 2019-10-16 DIAGNOSIS — F02818 Dementia in other diseases classified elsewhere, unspecified severity, with other behavioral disturbance: Secondary | ICD-10-CM

## 2019-10-16 MED ORDER — RIVASTIGMINE TARTRATE 1.5 MG PO CAPS: 1.5000 mg | ORAL_CAPSULE | Freq: Two times a day (BID) | ORAL | 11 refills | Status: DC

## 2019-10-16 MED ORDER — LORAZEPAM 1 MG PO TABS: ORAL_TABLET | ORAL | 5 refills | Status: DC

## 2019-10-16 MED ORDER — LEVETIRACETAM 750 MG PO TABS: ORAL_TABLET | ORAL | 11 refills | Status: DC

## 2019-10-16 MED ORDER — ESCITALOPRAM OXALATE 10 MG PO TABS: 10.0000 mg | ORAL_TABLET | Freq: Every day | ORAL | 11 refills | Status: DC
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
## 2019-10-24 DIAGNOSIS — L570 Actinic keratosis: Secondary | ICD-10-CM | POA: Diagnosis not present

## 2019-11-04 DIAGNOSIS — M170 Bilateral primary osteoarthritis of knee: Secondary | ICD-10-CM | POA: Diagnosis not present

## 2019-11-17 DIAGNOSIS — M1990 Unspecified osteoarthritis, unspecified site: Secondary | ICD-10-CM | POA: Diagnosis not present

## 2019-11-17 DIAGNOSIS — I10 Essential (primary) hypertension: Secondary | ICD-10-CM | POA: Diagnosis not present

## 2019-11-17 DIAGNOSIS — N1831 Chronic kidney disease, stage 3a: Secondary | ICD-10-CM | POA: Diagnosis not present

## 2019-11-17 DIAGNOSIS — E039 Hypothyroidism, unspecified: Secondary | ICD-10-CM | POA: Diagnosis not present

## 2019-11-17 DIAGNOSIS — E785 Hyperlipidemia, unspecified: Secondary | ICD-10-CM | POA: Diagnosis not present

## 2019-11-17 DIAGNOSIS — M810 Age-related osteoporosis without current pathological fracture: Secondary | ICD-10-CM | POA: Diagnosis not present

## 2019-11-17 DIAGNOSIS — E7800 Pure hypercholesterolemia, unspecified: Secondary | ICD-10-CM | POA: Diagnosis not present

## 2019-11-21 ENCOUNTER — Other Ambulatory Visit: Payer: Self-pay | Admitting: Internal Medicine

## 2019-11-21 DIAGNOSIS — M810 Age-related osteoporosis without current pathological fracture: Secondary | ICD-10-CM

## 2019-12-16 ENCOUNTER — Encounter: Payer: Self-pay | Admitting: Psychology

## 2019-12-16 ENCOUNTER — Other Ambulatory Visit: Payer: Self-pay

## 2019-12-16 ENCOUNTER — Ambulatory Visit (INDEPENDENT_AMBULATORY_CARE_PROVIDER_SITE_OTHER): Payer: Medicare Other | Admitting: Psychology

## 2019-12-16 ENCOUNTER — Ambulatory Visit: Payer: Medicare Other | Admitting: Psychology

## 2019-12-16 DIAGNOSIS — F0281 Dementia in other diseases classified elsewhere with behavioral disturbance: Secondary | ICD-10-CM

## 2019-12-16 DIAGNOSIS — G3184 Mild cognitive impairment, so stated: Secondary | ICD-10-CM

## 2019-12-16 DIAGNOSIS — G301 Alzheimer's disease with late onset: Secondary | ICD-10-CM

## 2019-12-16 NOTE — Progress Notes (Signed)
   Psychometrician Note   Renee Zuniga completed 95 minutes of neuropsychological testing with technician, Milana Kidney, B.S., under the supervision of Dr. Christia Reading, Ph.D., licensed psychologist. The patient did not appear overtly distressed by the testing session, per behavioral observation or via self-report to the technician. Rest breaks were offered.    In considering the patient's current level of functioning, level of presumed impairment, nature of symptoms, emotional and behavioral responses during the interview, level of literacy, and observed level of motivation/effort, a battery of tests was selected and communicated to the psychometrician.   Communication between the psychologist and technician was ongoing throughout the testing session and changes were made as deemed necessary based on patient performance on testing, technician observations and additional pertinent factors such as those listed above.   Renee Zuniga will return within approximately two weeks for an interactive feedback session with Dr. Melvyn Novas at which time his test performances, clinical impressions, and treatment recommendations will be reviewed in detail. The patient understands she can contact our office should she require our assistance before this time.  95 minutes were spent face-to-face with patient administering standardized tests and 66minutes were spent scoring (technician). [CPT [id number], [id number]  This note reflects time spent with the psychometrician and does not include test scores or any clinical interpretations made by Dr. Melvyn Novas. The full report will follow in a separate note.

## 2019-12-16 NOTE — Progress Notes (Addendum)
NEUROPSYCHOLOGICAL EVALUATION Renee Zuniga. [facility] Department of Neurology  Reason for Referral:   Renee Zuniga is a 86 y.o. Caucasian female referred by Renee Zuniga, M.D., to characterize her current cognitive functioning and assist with diagnostic clarity and treatment planning in the context of subjective cognitive decline and a history of seizure activity and a mild neurocognitive disorder, amnestic type.  Assessment and Plan:   Clinical Impression(s): Renee Zuniga pattern of performance is suggestive of profound difficulties with retrieval aspects of both verbal and visual memory. Additional weakness were exhibited across verbal fluency (including a semantic fluency set shifting task) and on an isolated task assessing the spatial properties of line drawings. Performance was normatively appropriate across domains of processing speed, attention/concentration, executive functioning (outside of one task assessing fluent set shifting), receptive language, confrontation naming, visuoconstructional abilities, and encoding (i.e., learning) aspects of memory.  Relative to her prior evaluation in December 2018, notable performance declines were observed across retrieval aspects of memory. Subtle to mild declines were also seen across verbal fluency, cognitive flexibility, and visuospatial functioning. Outside of these domains, performance appeared largely stable. Overall, Renee Zuniga continues to meet criteria for a Mild Neurocognitive Disorder (formerly "mild cognitive impairment"), amnestic type. However, this now appears towards the severe end of this spectrum given some evidence for emerging difficulties performing instrumental ADLs.   Regarding etiology of cognitive deficits, Renee Zuniga history of left temporal lobe seizure activity could certainly be influencing weaknesses in verbal abilities, especially verbal fluency, assuming normal lateralization in this right-handed  individual. The presence of an amnestic mild neurocognitive disorder does increase Renee Zuniga risk for the development of late-onset Alzheimer's disease. However, confrontation naming scores remained appropriate and she was able to answer appropriately across recognition trials on 2/3 memory measures, making this diagnosis less likely at the present time. However, since decline was observed, continued medical monitoring will be important moving forward.   Recommendations: A repeat neuropsychological evaluation in 12-18 months (or sooner if functional decline is noted) is recommended to assess the trajectory of future cognitive decline should it occur. This will also aid in future efforts towards improved diagnostic clarity.  Should a significant change in her home living situation occur, I do have concerns regarding Renee Zuniga's ability to live independently given her memory deficits. It would likely be challenging for her to manage medications and personal finances independently and she would be at risk for leaving the stove on or other unsafe behaviors. If not already done, Renee Zuniga and her family may want to discuss future plans for caretaking and seek out community options for in home/residential care should they become necessary.  To address problems with increased agitation towards others, Renee Zuniga and her daughter may wish to discuss medication options with either Dr. Delice Lesch or her PCP. I do not feel that short-term therapy to develop coping strategies for dealing with frustration would be fruitful as Renee Zuniga could have difficulty remembering session content after its conclusion.    Should there be a continued progression of her current deficits over time, Renee Zuniga is unlikely to regain any independent living skills lost. Therefore, it is recommended that she remain as involved as possible in all aspects of household chores, finances, and medication management, with supervision to ensure  adequate performance. She will likely benefit from the establishment and maintenance of a routine in order to maximize her functional abilities over time.  It will be important for Renee Zuniga to have another person  with her when in situations where she may need to process information, weigh the pros and cons of different options, and make decisions, in order to ensure that she fully understands and recalls all information to be considered.  All important information should be provided in written format and given to Renee Zuniga to reduce her reliance on memory retrieval capabilities. This information should be placed in a highly visible part of their home to ensure that she sees it regularly.   Review of Records:   Renee Zuniga completed a comprehensive neuropsychological evaluation Renee Zuniga, Psy.D.) on 11/23/2017. Results of cognitive testing were largely within normal limits for her age. However, she did demonstrate a focal weakness in the retrieval of newly learned information. She was not entirely amnestic and performance across recognition trials was within normal limits. Based upon these scores, she was diagnosed with mild cognitive impairment, amnestic type. While the etiology was said to be unclear, left temporal lobe seizures and/or underlying neuropathology were said to be among the differentials. It was recommended that Renee Zuniga repeat her evaluation in 12-18 months.  Renee Zuniga was recently seen by Renee Zuniga Neurology Renee Zuniga, M.D.) on 10/16/2019 for follow-up of memory loss and focal seizures with impaired awareness. Renee Zuniga was said to be amnestic of seizure activity and denied ever having them. Since her prior visit 1 year prior, Renee Zuniga daughter reported 2-3 episodes where she appeared more confused and did not recognize her husband; she was given her prn Ativan at those times. Renee Zuniga denied observable cognitive difficulties. Medications are in pillpacks which her husband  administers. Renee Zuniga has left the stove on and candles burning. Her daughter and husband have taken away her credit card and her husband gives her cash due to questionable spending. Family concerns were also expressed, centered on Ms. Arnwine being more easily frustrated, even belligerent at times, due to her not understanding or appreciating external variables. Performance on a brief cognitive screening instrument (SLUMS) was 21/30. Ultimately, Ms. Potenza was referred for a repeat neuropsychological evaluation to assess for potential cognitive decline.  Brain MRI on 11/09/2014 revealed generalized age-related cerebral atrophy and mild chronic small vessel ischemic disease. Brain MRI on 08/19/2016 revealed stable moderate atrophy and white matter disease.   Past Medical History:  Diagnosis Date  . Arthritis   . Heart murmur   . High blood pressure   . HTN (hypertension) 11/09/2014  . Hypercholesteremia   . Insomnia   . Localization-related idiopathic epilepsy and epileptic syndromes with seizures of localized onset, not intractable, without status epilepticus ([place]) 08/12/2015  . Migraine headache   . Mild neurocognitive disorder, amnestic type 08/22/2017  . Mitral valve prolapse   . Osteoarthritis   . Osteopenia   . Pelvis fracture ([place]) 11/26/2018  . Seizures ([place])   . TIA (transient ischemic attack) 11/09/2014  . Tinnitus   . Vitamin D deficiency     Past Surgical History:  Procedure Laterality Date  . CHOLECYSTECTOMY    . CORRECTION HAMMER TOE Bilateral   . GALLBLADDER SURGERY    . OOPHORECTOMY     benign tumor, per patient  . PARTIAL HYSTERECTOMY  1974   due to uterine prolaspe    Family History  Problem Relation Age of Onset  . Mitral valve prolapse Father   . Heart Problems Mother   . Hypertension Mother      Current Outpatient Medications:  .  acetaminophen (TYLENOL) 325 MG tablet, Take 2 tablets (650 mg total) by mouth  every 6 (six) hours as needed for mild pain (or Fever  >/= 101)., Disp: , Rfl:  .  Calcium Carb-Cholecalciferol (OYSTER SHELL CALCIUM/VITAMIN D) 500-200 MG-UNIT TABS, Take 1 tablet by mouth 2 (two) times daily., Disp: , Rfl:  .  chlordiazePOXIDE (LIBRIUM) 10 MG capsule, Take 1 capsule (10 mg total) by mouth daily., Disp: 10 capsule, Rfl: 0 .  cyclobenzaprine (FLEXERIL) 5 MG tablet, Take 1 tablet (5 mg total) by mouth 3 (three) times daily as needed for muscle spasms., Disp: 20 tablet, Rfl: 0 .  diclofenac sodium (VOLTAREN) 1 % GEL, Apply 2 g topically daily. For knee pain, Disp: , Rfl:  .  escitalopram (LEXAPRO) 10 MG tablet, Take 1 tablet (10 mg total) by mouth daily., Disp: 30 tablet, Rfl: 11 .  ezetimibe (ZETIA) 10 MG tablet, Take 10 mg by mouth at bedtime. , Disp: , Rfl:  .  ibandronate (BONIVA) 150 MG tablet, Take 150 mg by mouth every 30 (thirty) days. Take in the morning with a full glass of water, on an empty stomach, and do not take anything else by mouth or lie down for the next 30 min., Disp: , Rfl:  .  ketoconazole (NIZORAL) 2 % shampoo, Apply 1 application topically 3 (three) times a week. Apply to scalp after shampooing, Disp: , Rfl:  .  levETIRAcetam (KEPPRA) 750 MG tablet, Take 2 tablets twice a day, Disp: 120 tablet, Rfl: 11 .  levothyroxine (SYNTHROID, LEVOTHROID) 50 MCG tablet, Take 50 mcg by mouth daily before breakfast., Disp: , Rfl:  .  lisinopril (PRINIVIL,ZESTRIL) 10 MG tablet, Take 10 mg by mouth daily., Disp: , Rfl:  .  LORazepam (ATIVAN) 1 MG tablet, TAKE 1 TABLET AS NEEDED FOR SEIZURE. DO NOT TAKE MORE THAN 2 IN 24 HRS., Disp: 10 tablet, Rfl: 5 .  Multiple Vitamins-Minerals (MULTIVITAMIN WITH MINERALS) tablet, Take 1 tablet by mouth daily., Disp: , Rfl:  .  Multiple Vitamins-Minerals (OCUVITE PO), Take 1 tablet by mouth daily., Disp: , Rfl:  .  Omega-3 Fatty Acids (FISH OIL) 1000 MG CAPS, Take 1,000 mg by mouth daily. , Disp: , Rfl:  .  oxyCODONE (OXY IR/ROXICODONE) 5 MG immediate release tablet, Take 1 tablet (5 mg total) by  mouth every 4 (four) hours as needed for moderate pain., Disp: 20 tablet, Rfl: 0 .  pramipexole (MIRAPEX) 0.125 MG tablet, Take 0.375 mg by mouth at bedtime. , Disp: , Rfl:  .  rivastigmine (EXELON) 1.5 MG capsule, Take 1 capsule (1.5 mg total) by mouth 2 (two) times daily., Disp: 60 capsule, Rfl: 11 .  senna-docusate (SENOKOT-S) 8.6-50 MG tablet, Take 2 tablets by mouth 2 (two) times daily., Disp: , Rfl:  .  vitamin E 400 UNIT capsule, Take 400 Units by mouth daily., Disp: , Rfl:   Clinical Interview:   Cognitive Symptoms: Decreased short-term memory: Denied. However, Ms. Stair daughter noted that her mother repeats herself often, asks repetitive questions, and often forgets about upcoming appointments or recent events. These were said to have been worsening since her previous evaluation. Decreased long-term memory: Denied. Decreased attention/concentration: Denied. Reduced processing speed: Denied. Difficulties with executive functions: Denied. Difficulties with emotion regulation: Denied. However, Ms. Hafler daughter did note some personality changes in her mother, including her being more easily frustrated and belligerent at times. For example, her husband has been recently diagnosed with stage 4 urethral cancer. Despite this, she started inviting people for Thanksgiving. When he wanted to cancel due to health concerns, she became angry and  started yelling at him. This behavior was said to have worsened, especially over the past 6 months.  Difficulties with receptive language: Denied. Difficulties with word finding: Denied. Decreased visuoperceptual ability: Denied.  Difficulties completing ADLs: Endorsed. Ms. Warbington stated that she does not take any medications. However, her daughter noted that she takes several medications, that these are organized for her in pillpacks, and that they are administered to her by her husband. Her daughter noted that if her mother was left to do this task  independently, she likely would stop taking her medications altogether. Her husband also manages their personal finances. Ms. Buccellato has not driven for the past 1.5 years, due to seizure precautions.   Additional Medical History: History of traumatic brain injury/concussion: Denied. However, her daughter noted a remote instance while in [place] where Ms. Verney fell and hit her head, requiring a brief hospitalization. Ms. Bargas denied any recollection of this event.  History of stroke: Denied. History of seizure activity: Endorsed. Ms. Dury denied the occurrence of seizure activity. 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 An additional 24-hour EEG was within normal limits, with sharply contoured waveforms over the bilateral temporal regions, left greater than right, consistent with wicket spikes. No clear epileptiform discharges were seen. Since her previous evaluation, Ms. Cousineau daughter noted 2 instances of potential seizure activity (12/25/2018 and 06/02/2019) where she had to receive her prn Ativan medication.  History of known exposure to toxins: Denied. Symptoms of chronic pain: Denied. Experience of frequent headaches/migraines: Denied. Frequent instances of dizziness/vertigo: Denied. However, her daughter noted infrequent instances of dizziness, generally when Ms. Devanney has not eaten enough.  Sensory changes: Denied. However, her daughter noted ongoing hearing loss and that Ms. Wege was scheduled to meet with an audiologist before the end of January to have this evaluated.  Balance/coordination difficulties: Denied. A history of falls was also denied. Other motor difficulties: Denied.  Sleep History: Estimated hours obtained each night: 8+ hours. Difficulties falling asleep: Denied. Difficulties staying asleep: Denied. Feels rested and refreshed upon awakening: Endorsed.  History of snoring: "A little  bit" per her daughter. History of waking up gasping for air: Denied. Witnessed breath cessation while asleep: Denied.  History of vivid dreaming: Denied. Excessive movement while asleep: Denied. Instances of acting out her dreams: Denied.  Psychiatric/Behavioral Health History: Depression: Denied. Ms. Sabir described her mood as "good" and denied any prior mental health concerns or diagnoses. Her daughter pointed out that she had expressed some sadness and anxiety surrounding her husband's recent cancer diagnosis. Ms. Mallinger did acknowledge this, stating that is was appropriate.  Anxiety: Denied. Mania: Denied. Trauma History: Denied. Visual/auditory hallucinations: Denied. Delusional thoughts: Denied.   Tobacco: Denied. Alcohol: Ms. Skaja drinks one glass of wine per night with dinner. She denied a history of problematic alcohol abuse or dependence. Recreational drugs: Denied. Caffeine: 3 cups of black tea per day.  Academic/Vocational History: Highest level of educational attainment: 16 years. Ms. Sasseen completed high school and earned two college degrees: a Water quality scientist degree in education from [employer] and a Manufacturing engineer of Fine Arts degree from [employer]. She described herself as a good (A/B) student in academic settings. Mathematics was noted as a relative weakness. History of developmental delay: Denied. History of grade repetition: Denied. Enrollment in special education courses: Denied. History of diagnosed specific learning disability: Denied. History of ADHD: Denied.  Employment: Retired. She previously worked as a  3rd grade teacher.   Evaluation Results:   Behavioral Observations: Ms. Baldy was accompanied by her daughter, arrived to her appointment on time, and was appropriately dressed and groomed. She ambulated slowly, but appeared stable and did require a cane or any other assistive devices or external support. Gross motor functioning appeared intact  upon informal observation and no abnormal movements (e.g., tremors) were noted. Her affect was generally relaxed and positive. However, she did become notably frustrated when her daughter contradicted her report and described areas of difficulty or prior medical history. Spontaneous speech was fluent and word finding difficulties were not observed during the clinical interview. Sustained attention was appropriate throughout. Thought processes were coherent, organized, and normal in content. She frequently asked the purpose of the evaluation while testing, despite this being explained at the conclusion of the interview. Despite this, task engagement was adequate and she persisted when challenged. An aspect of one task (D-KEFS Color-Word Interference) was discontinued due to Ms. Scheffler's refusal to complete the final condition. Overall, Ms. Skarzynski was largely cooperative with the clinical interview and subsequent testing procedures.   Adequacy of Effort: The validity of neuropsychological testing is limited by the extent to which the individual being tested may be assumed to have exerted adequate effort during testing. Ms. Boakye expressed her intention to perform to the best of her abilities and exhibited adequate task engagement and persistence. Scores across stand-alone and embedded performance validity measures were within expectation. As such, the results of the current evaluation are believed to be a valid representation of Ms. Goldsberry current cognitive functioning.  Test Results: Ms. Ao was disoriented at the time of the current evaluation. Points were lost for her stating her incorrect age ("89"), the incorrect year ("2020"), and being unclear of the current month, date, day of the week, and current location.  Intellectual abilities based upon educational and vocational attainment were estimated to be in the average range. Premorbid abilities were estimated to be within the average range based upon a  single-word reading test.   Processing speed was largely within normal limits. Basic attention was above average. More complex attention (e.g., working memory) was average to above average. Executive functioning was largely within normal limits. A normative weakness was exhibited across an isolated task assessing fluent verbal set shifting.  Assessed receptive language abilities were average. Likewise, Ms. Gustine did not exhibit any difficulties comprehending task instructions and answered all questions asked of her appropriately. Assessed expressive language (e.g., verbal fluency and confrontation naming) was variable. Verbal fluency represented a relative weakness, while confrontation naming was within normal limits.     Assessed visuospatial/visuoconstructional abilities were largely appropriate. However, she did exhibit some trouble on a task assessing the spatial properties of line drawings.    Learning (i.e., encoding) of novel verbal information was average. Spontaneous delayed recall (i.e., retrieval) of previously learned information was exceptionally low. Retention rates were very poor across memory measures, with Ms. Occhipinti being amnestic across a list learning measure. Performance across recognition tasks was variable, but normatively appropriate across story and complex shape trials, suggesting some evidence for information consolidation.   Results of emotional screening instruments suggested that recent symptoms of generalized anxiety were in the minimal range, while symptoms of depression were within normal limits. A screening instrument assessing recent sleep quality suggested the presence of minimal sleep dysfunction.  Tables of Scores:   Note: This summary of test scores accompanies the interpretive report and should not be considered in isolation without reference to  the appropriate sections in the text. Descriptors are based on appropriate normative data and may be adjusted based on  clinical judgment. The terms "impaired" and "within normal limits (WNL)" are used when a more specific level of functioning cannot be determined. Descriptors refer to the current evaluation only.        Effort Testing:    DESCRIPTOR   December 2018 Current    Dot Counting Test: --- --- --- Within Expectation  RBANS Effort Index: --- --- --- Within Expectation  WAIS-IV Reliable Digit Span: --- --- --- Within Expectation        Orientation:       Raw Score Raw Score Percentile   NAB Orientation, Form 1 --- 18/29 --- ---        Cognitive Screening:            RBANS, Form A: Standard Score Standard Score/ Scaled Score Percentile   Total Score --- 82 12 Below Average  Immediate Memory 90 90 25 Average    List Learning --- 8 25 Average    Story Memory --- 9 37 Average  Visuospatial/Constructional 109 92 30 Average    Figure Copy --- 12 75 Above Average    Line Orientation --- 12/20 3-9 Well Below Average  Language 95 89 23 Below Average    Picture Naming --- 10/10 >75 Above Average    Semantic Fluency --- 5 5 Well Below Average  Attention 106 109 73 Average    Digit Span --- 14 91 Above Average    Coding --- 9 37 Average  Delayed Memory 78 52 <1 Exceptionally Low    List Recall --- 0/10 <2 Exceptionally Low    List Recognition --- 13/20 <2 Exceptionally Low    Story Recall --- 4 2 Well Below Average    Story Recognition --- 9/12 29-52 Average    Figure Recall --- 5 5 Well Below Average    Figure Recognition --- 4/8 21-38 Average        Intellectual Functioning:             Standard Score Standard Score Percentile   Test of Premorbid Functioning: 108 99 47 Average        Attention/Executive Function:            Trail Making Test (TMT): Raw Score Raw Score (T Score) Percentile     Part A 37 secs.,  0 errors 35 secs.,  0 errors (52) 58 Average    Part B 114 secs.,  1 error 144 secs.,  3 errors (44) 27 Average         Scaled Score Scaled Score Percentile   WAIS-IV Digit  Span: --- 12 75 Above Average    Forward 9 13 84 Above Average    Backward 11 9 37 Average    Sequencing --- 13 84 Above Average         Scaled Score Scaled Score Percentile   WAIS-IV Similarities: 12 9 37 Average        D-KEFS Color-Word Interference Test: Raw Score Raw Score (Scaled Score) Percentile     Color Naming --- 49 secs. (5) 5 Well Below Average    Word Reading --- 31 secs. (8) 25 Average    Inhibition --- 109 secs. (8) 25 Average      Total Errors --- 3 errors (10) 50 Average    Inhibition/Switching --- Discontinued --- ---      Total Errors --- --- --- ---  D-KEFS Verbal Fluency Test: Raw Score Raw Score (Scaled Score) Percentile     Letter Total Correct --- 20 (6) 9 Below Average    Category Total Correct --- 21 (6) 9 Below Average    Category Switching Total Correct --- 5 (3) 1 Exceptionally Low    Category Switching Accuracy --- 3 (4) 2 Well Below Average      Total Set Loss Errors --- 3 (9) 37 Average      Total Repetition Errors --- 2 (11) 63 Average        D-KEFS 20 Questions Test: Scaled Score Scaled Score Percentile     Total Weighted Achievement Score --- 10 50 Average    Initial Abstraction Score --- 14 91 Above Average        Language:            Verbal Fluency Test: Raw Score Raw Score (Z-Score) Percentile     Phonemic Fluency (FAS) 33 20 (-1.52) 7 Well Below Average    Animal Fluency 14 12 (-1) 16 Below Average  *Based on [facility] Older Normative Studies (MOANS)            NAB Language Module, Form 1: T Score T Score Percentile     Auditory Comprehension --- 47 38 Average    Naming --- 30/31 (63) 91 Well Above Average        Visuospatial/Visuoconstruction:       Raw Score Raw Score Percentile   Clock Drawing: WNL 10/10 --- Within Normal Limits        Mood and Personality:       Raw Score Raw Score Percentile   Geriatric Depression Scale: --- 0 --- Within Normal Limits  Geriatric Anxiety Scale: --- 0 --- Minimal    Somatic --- 0 ---  Minimal    Cognitive --- 0 --- Minimal    Affective --- 0 --- Minimal        Additional Questionnaires:       Raw Score Raw Score Percentile   PROMIS Sleep Disturbance Questionnaire: --- 9 --- None to Slight   Informed Consent and Coding/Compliance:   Ms. Michelena was provided with a verbal description of the nature and purpose of the present neuropsychological evaluation. Also reviewed were the foreseeable risks and/or discomforts and benefits of the procedure, limits of confidentiality, and mandatory reporting requirements of this provider. The patient was given the opportunity to ask questions and receive answers about the evaluation. Oral consent to participate was provided by the patient.   This evaluation was conducted by Christia Reading, Ph.D., licensed clinical neuropsychologist. Ms. Deoliveira completed a 30-minute clinical interview, billed as one unit [phone], and 110 minutes of cognitive testing, billed as one unit [phone] and three additional units [id number]. Psychometrist Milana Kidney, B.S., assisted Dr. Melvyn Novas with test administration and scoring procedures. As a separate and discrete service, Dr. Melvyn Novas spent a total of 180 minutes in interpretation and report writing, billed as one unit 96132 and two units 96133.

## 2019-12-24 ENCOUNTER — Encounter: Payer: Medicare Other | Admitting: Psychology

## 2019-12-30 ENCOUNTER — Encounter: Payer: Self-pay | Admitting: Psychology

## 2019-12-30 ENCOUNTER — Other Ambulatory Visit: Payer: Self-pay

## 2019-12-30 ENCOUNTER — Telehealth (INDEPENDENT_AMBULATORY_CARE_PROVIDER_SITE_OTHER): Payer: Medicare Other | Admitting: Psychology

## 2019-12-30 DIAGNOSIS — Z8669 Personal history of other diseases of the nervous system and sense organs: Secondary | ICD-10-CM

## 2019-12-30 DIAGNOSIS — G3184 Mild cognitive impairment, so stated: Secondary | ICD-10-CM

## 2019-12-30 DIAGNOSIS — G40009 Localization-related (focal) (partial) idiopathic epilepsy and epileptic syndromes with seizures of localized onset, not intractable, without status epilepticus: Secondary | ICD-10-CM

## 2019-12-30 NOTE — Progress Notes (Signed)
   Neuropsychology Feedback Session Renee Zuniga. [facility] Department of Neurology  Reason for Referral:   Renee Zuniga a 86 y.o. Caucasian female referred by Ellouise Newer, M.D.,to characterize hercurrent cognitive functioning and assist with diagnostic clarity and treatment planning in the context of subjective cognitive decline and a history of seizure activity and a mild neurocognitive disorder, amnestic type.  Feedback:   Renee Zuniga completed a comprehensive neuropsychological evaluation on 12/16/2019. Please refer to that encounter for the full report and recommendations. Briefly, results suggested profound difficulties with retrieval aspects of both verbal and visual memory. Additional weakness were exhibited across verbal fluency (including a semantic fluency set shifting task) and on an isolated task assessing the spatial properties of line drawings. Performance was normatively appropriate across domains of processing speed, attention/concentration, executive functioning (outside of one task assessing fluent set shifting), receptive language, confrontation naming, visuoconstructional abilities, and encoding (i.e., learning) aspects of memory. Relative to her prior evaluation in December 2018, notable performance declines were observed across retrieval aspects of memory. Subtle to mild declines were also seen across verbal fluency, cognitive flexibility, and visuospatial functioning. Outside of these domains, performance appeared largely stable. Overall, Renee Zuniga continues to meet criteria for a mild neurocognitive disorder (formerly "mild cognitive impairment"), amnestic type, at the present time. However, this now appears towards the severe end of this spectrum given some evidence for emerging difficulties performing instrumental ADLs.   Renee Zuniga was accompanied by her daughter and husband to the current virtual feedback appointment. Content of the current session  focused on the results of her neuropsychological evaluation. Renee Zuniga and her family were given the opportunity to ask questions and her questions were answered. They were encouraged to reach out should additional questions arise. A copy of her report was mailed at the conclusion of the visit.      Between 30 and 60 minutes were spent preparing for and conducting the current feedback session with Renee Zuniga, billed as one unit [phone].

## 2020-01-01 DIAGNOSIS — L853 Xerosis cutis: Secondary | ICD-10-CM | POA: Diagnosis not present

## 2020-01-01 DIAGNOSIS — L814 Other melanin hyperpigmentation: Secondary | ICD-10-CM | POA: Diagnosis not present

## 2020-01-01 DIAGNOSIS — D1801 Hemangioma of skin and subcutaneous tissue: Secondary | ICD-10-CM | POA: Diagnosis not present

## 2020-01-01 DIAGNOSIS — L821 Other seborrheic keratosis: Secondary | ICD-10-CM | POA: Diagnosis not present

## 2020-01-05 ENCOUNTER — Encounter: Payer: Medicare Other | Admitting: Psychology

## 2020-01-10 ENCOUNTER — Ambulatory Visit: Payer: Medicare Other

## 2020-01-15 ENCOUNTER — Ambulatory Visit: Payer: Medicare Other

## 2020-01-17 ENCOUNTER — Ambulatory Visit: Payer: Medicare Other | Attending: Internal Medicine

## 2020-02-08 DIAGNOSIS — N390 Urinary tract infection, site not specified: Secondary | ICD-10-CM | POA: Diagnosis not present

## 2020-02-08 DIAGNOSIS — R350 Frequency of micturition: Secondary | ICD-10-CM | POA: Diagnosis not present

## 2020-02-13 DIAGNOSIS — E039 Hypothyroidism, unspecified: Secondary | ICD-10-CM | POA: Diagnosis not present

## 2020-02-13 DIAGNOSIS — I10 Essential (primary) hypertension: Secondary | ICD-10-CM | POA: Diagnosis not present

## 2020-02-13 DIAGNOSIS — E7800 Pure hypercholesterolemia, unspecified: Secondary | ICD-10-CM | POA: Diagnosis not present

## 2020-02-13 DIAGNOSIS — M1990 Unspecified osteoarthritis, unspecified site: Secondary | ICD-10-CM | POA: Diagnosis not present

## 2020-02-13 DIAGNOSIS — N183 Chronic kidney disease, stage 3 unspecified: Secondary | ICD-10-CM | POA: Diagnosis not present

## 2020-02-13 DIAGNOSIS — E785 Hyperlipidemia, unspecified: Secondary | ICD-10-CM | POA: Diagnosis not present

## 2020-02-13 DIAGNOSIS — M810 Age-related osteoporosis without current pathological fracture: Secondary | ICD-10-CM | POA: Diagnosis not present

## 2020-02-19 ENCOUNTER — Other Ambulatory Visit: Payer: Self-pay

## 2020-02-19 ENCOUNTER — Ambulatory Visit
Admission: RE | Admit: 2020-02-19 | Discharge: 2020-02-19 | Disposition: A | Discharge status: Home / Self Care | Payer: Medicare Other | Source: Ambulatory Visit | Attending: Internal Medicine | Admitting: Internal Medicine

## 2020-02-19 DIAGNOSIS — M85832 Other specified disorders of bone density and structure, left forearm: Secondary | ICD-10-CM | POA: Diagnosis not present

## 2020-02-19 DIAGNOSIS — M810 Age-related osteoporosis without current pathological fracture: Secondary | ICD-10-CM

## 2020-02-19 DIAGNOSIS — Z780 Asymptomatic menopausal state: Secondary | ICD-10-CM | POA: Diagnosis not present

## 2020-02-25 DIAGNOSIS — R350 Frequency of micturition: Secondary | ICD-10-CM | POA: Diagnosis not present

## 2020-04-06 DIAGNOSIS — M170 Bilateral primary osteoarthritis of knee: Secondary | ICD-10-CM | POA: Diagnosis not present

## 2020-06-17 DIAGNOSIS — N183 Chronic kidney disease, stage 3 unspecified: Secondary | ICD-10-CM | POA: Diagnosis not present

## 2020-06-17 DIAGNOSIS — E039 Hypothyroidism, unspecified: Secondary | ICD-10-CM | POA: Diagnosis not present

## 2020-06-17 DIAGNOSIS — E785 Hyperlipidemia, unspecified: Secondary | ICD-10-CM | POA: Diagnosis not present

## 2020-06-17 DIAGNOSIS — G301 Alzheimer's disease with late onset: Secondary | ICD-10-CM | POA: Diagnosis not present

## 2020-06-17 DIAGNOSIS — I10 Essential (primary) hypertension: Secondary | ICD-10-CM | POA: Diagnosis not present

## 2020-06-17 DIAGNOSIS — F4321 Adjustment disorder with depressed mood: Secondary | ICD-10-CM | POA: Diagnosis not present

## 2020-06-17 DIAGNOSIS — F0280 Dementia in other diseases classified elsewhere without behavioral disturbance: Secondary | ICD-10-CM | POA: Diagnosis not present

## 2020-06-17 DIAGNOSIS — E7800 Pure hypercholesterolemia, unspecified: Secondary | ICD-10-CM | POA: Diagnosis not present

## 2020-06-17 DIAGNOSIS — M810 Age-related osteoporosis without current pathological fracture: Secondary | ICD-10-CM | POA: Diagnosis not present

## 2020-06-17 DIAGNOSIS — M1990 Unspecified osteoarthritis, unspecified site: Secondary | ICD-10-CM | POA: Diagnosis not present

## 2020-07-16 DIAGNOSIS — R350 Frequency of micturition: Secondary | ICD-10-CM | POA: Diagnosis not present

## 2020-07-20 DIAGNOSIS — G301 Alzheimer's disease with late onset: Secondary | ICD-10-CM | POA: Diagnosis not present

## 2020-07-20 DIAGNOSIS — E039 Hypothyroidism, unspecified: Secondary | ICD-10-CM | POA: Diagnosis not present

## 2020-07-20 DIAGNOSIS — E785 Hyperlipidemia, unspecified: Secondary | ICD-10-CM | POA: Diagnosis not present

## 2020-07-20 DIAGNOSIS — F0280 Dementia in other diseases classified elsewhere without behavioral disturbance: Secondary | ICD-10-CM | POA: Diagnosis not present

## 2020-07-20 DIAGNOSIS — I10 Essential (primary) hypertension: Secondary | ICD-10-CM | POA: Diagnosis not present

## 2020-07-20 DIAGNOSIS — N183 Chronic kidney disease, stage 3 unspecified: Secondary | ICD-10-CM | POA: Diagnosis not present

## 2020-07-20 DIAGNOSIS — F4321 Adjustment disorder with depressed mood: Secondary | ICD-10-CM | POA: Diagnosis not present

## 2020-07-20 DIAGNOSIS — M810 Age-related osteoporosis without current pathological fracture: Secondary | ICD-10-CM | POA: Diagnosis not present

## 2020-07-20 DIAGNOSIS — M1990 Unspecified osteoarthritis, unspecified site: Secondary | ICD-10-CM | POA: Diagnosis not present

## 2020-07-20 DIAGNOSIS — E7800 Pure hypercholesterolemia, unspecified: Secondary | ICD-10-CM | POA: Diagnosis not present

## 2020-08-11 DIAGNOSIS — L603 Nail dystrophy: Secondary | ICD-10-CM | POA: Diagnosis not present

## 2020-08-18 ENCOUNTER — Telehealth: Payer: Self-pay | Admitting: Neurology

## 2020-08-18 NOTE — Telephone Encounter (Signed)
Pls send our condolences. Agree to put her on waitlist for earlier visit. Stress likely a factor. Also, who is administering her medications, is compliance an issue? Thanks

## 2020-08-18 NOTE — Telephone Encounter (Signed)
Patient's daughter called with concerns. She said, "My father who took care of everything for my mom, passed away about two months ago. Since then, Mom's been experiencing increased confusion and sometimes doesn't recognize family and friends. My main concern is that within the last week she's had two seizures. Normally, they don't happen that frequently. I'm concerned and want to know if the doctor can work my mom into the schedule sooner than next year."

## 2020-08-18 NOTE — Telephone Encounter (Signed)
Pt is added to the wait list, pt daughter updated phone number and e-mail. They have 24 hour care with caregivers giving Renee Zuniga her medication ,

## 2020-08-20 ENCOUNTER — Other Ambulatory Visit: Payer: Self-pay | Admitting: Neurology

## 2020-08-20 DIAGNOSIS — G40009 Localization-related (focal) (partial) idiopathic epilepsy and epileptic syndromes with seizures of localized onset, not intractable, without status epilepticus: Secondary | ICD-10-CM

## 2020-08-20 MED ORDER — LORAZEPAM 1 MG PO TABS: ORAL_TABLET | ORAL | 5 refills | Status: DC

## 2020-08-20 NOTE — Telephone Encounter (Signed)
Patient's daughter is requesting a refill of Lorazepam sent to [facility]. Daughter mentioned patient will be going out of town tomorrow and is requesting medication be filled today.

## 2020-08-20 NOTE — Telephone Encounter (Signed)
Patient's daughter states that her sister is taking patient out of town next week so she won't be able to come in for an appt but to please keep her on the cxl list.

## 2020-08-20 NOTE — Telephone Encounter (Signed)
I have a cancellation on 9/15 at 3:30pm, if they can come then. thanks

## 2020-09-07 ENCOUNTER — Telehealth: Payer: Self-pay | Admitting: Neurology

## 2020-09-07 NOTE — Telephone Encounter (Addendum)
Patient's daughter called and said she has been paying for overnight care for the patient since her father passed away. She is requesting a letter to help with reimbursement stating her mom "needs supervised care due to her Alzheimer's and seizure diagnoses."

## 2020-09-08 ENCOUNTER — Telehealth: Payer: Self-pay | Admitting: Neurology

## 2020-09-08 ENCOUNTER — Encounter: Payer: Self-pay | Admitting: Neurology

## 2020-09-08 NOTE — Telephone Encounter (Signed)
Done, thanks

## 2020-09-08 NOTE — Telephone Encounter (Signed)
Pls let her know that when she had the extensive memory testing (Neuropsychological testing) in January 2021, she was diagnosed with Mild Neurocognitive disorder, likely due to Alzheimers disease, and since she has progressed, then this is now called dementia or Major Neurocognitive disorder. Epilepsy means recurrent seizures that are not due to alcohol or drugs, so she has been treated for this since I first met her. Thanks

## 2020-09-08 NOTE — Telephone Encounter (Signed)
Pt daughter called and informed  that when her mother  had the extensive memory testing (Neuropsychological testing) in January 2021, she was diagnosed with Mild Neurocognitive disorder, likely due to Alzheimers disease, and since she has progressed, then this is now called dementia or Major Neurocognitive disorder. Epilepsy means recurrent seizures that are not due to alcohol or drugs, so she has been treated for this since I first met her.

## 2020-09-08 NOTE — Telephone Encounter (Signed)
Patient's daughter, Madoline Bhatt, called requesting a call back from a nurse. Specifically, she'd like to know when her mom was given the diagnosis codes of Alzheimer's disease and also epilepsy.  She said, "I need to share this information with the rest of the family and I want to be sure I have the correct information. I know she has an appointment on 09/15/20 but I'd really like to get this information before then."

## 2020-09-08 NOTE — Telephone Encounter (Signed)
Pt daughter called she will come by the office to pick up the letter requested

## 2020-09-09 ENCOUNTER — Telehealth: Payer: Self-pay

## 2020-09-09 NOTE — Telephone Encounter (Signed)
[employer] [employer] with [facility] is trying to help this patient with a long term care appeal and needs a copy of the letter that was given to the patient's daughter Zuzanna recently. The copy she received from [facility] was not clear and usable. Her fax number is [phone]

## 2020-09-09 NOTE — Telephone Encounter (Signed)
Letter faxed to number given

## 2020-09-14 DIAGNOSIS — Z66 Do not resuscitate: Secondary | ICD-10-CM | POA: Diagnosis not present

## 2020-09-14 DIAGNOSIS — Z23 Encounter for immunization: Secondary | ICD-10-CM | POA: Diagnosis not present

## 2020-09-14 DIAGNOSIS — R569 Unspecified convulsions: Secondary | ICD-10-CM | POA: Diagnosis not present

## 2020-09-14 DIAGNOSIS — N1831 Chronic kidney disease, stage 3a: Secondary | ICD-10-CM | POA: Diagnosis not present

## 2020-09-14 DIAGNOSIS — Z0000 Encounter for general adult medical examination without abnormal findings: Secondary | ICD-10-CM | POA: Diagnosis not present

## 2020-09-14 DIAGNOSIS — I5189 Other ill-defined heart diseases: Secondary | ICD-10-CM | POA: Diagnosis not present

## 2020-09-14 DIAGNOSIS — E039 Hypothyroidism, unspecified: Secondary | ICD-10-CM | POA: Diagnosis not present

## 2020-09-14 DIAGNOSIS — M810 Age-related osteoporosis without current pathological fracture: Secondary | ICD-10-CM | POA: Diagnosis not present

## 2020-09-14 DIAGNOSIS — F0280 Dementia in other diseases classified elsewhere without behavioral disturbance: Secondary | ICD-10-CM | POA: Diagnosis not present

## 2020-09-15 ENCOUNTER — Ambulatory Visit (INDEPENDENT_AMBULATORY_CARE_PROVIDER_SITE_OTHER): Payer: Medicare Other | Admitting: Neurology

## 2020-09-15 ENCOUNTER — Encounter: Payer: Self-pay | Admitting: Neurology

## 2020-09-15 ENCOUNTER — Other Ambulatory Visit: Payer: Self-pay

## 2020-09-15 VITALS — BP 128/77 | HR 73 | Ht 65.0 in | Wt 149.0 lb

## 2020-09-15 DIAGNOSIS — G40009 Localization-related (focal) (partial) idiopathic epilepsy and epileptic syndromes with seizures of localized onset, not intractable, without status epilepticus: Secondary | ICD-10-CM | POA: Diagnosis not present

## 2020-09-15 DIAGNOSIS — G301 Alzheimer's disease with late onset: Secondary | ICD-10-CM | POA: Diagnosis not present

## 2020-09-15 DIAGNOSIS — F0281 Dementia in other diseases classified elsewhere with behavioral disturbance: Secondary | ICD-10-CM | POA: Diagnosis not present

## 2020-09-15 DIAGNOSIS — F02818 Dementia in other diseases classified elsewhere, unspecified severity, with other behavioral disturbance: Secondary | ICD-10-CM

## 2020-09-15 MED ORDER — RIVASTIGMINE TARTRATE 3 MG PO CAPS: 3.0000 mg | ORAL_CAPSULE | Freq: Two times a day (BID) | ORAL | 11 refills | Status: DC

## 2020-09-15 MED ORDER — LORAZEPAM 1 MG PO TABS: ORAL_TABLET | ORAL | 5 refills | Status: DC

## 2020-09-15 MED ORDER — LEVETIRACETAM 750 MG PO TABS: ORAL_TABLET | ORAL | 11 refills | Status: DC

## 2020-09-15 MED ORDER — ESCITALOPRAM OXALATE 10 MG PO TABS: 10.0000 mg | ORAL_TABLET | Freq: Every day | ORAL | 11 refills | Status: DC

## 2020-09-15 NOTE — Patient Instructions (Signed)
1. Increase Rivastigmine to 3mg  twice a day  2. Continue Keppra 1500mg  twice a day. Refills for as needed Ativan have been sent as well  3. Continue 24/7 care  4. Follow-up in 6 months, call for any changes
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
## 2020-09-16 DIAGNOSIS — L821 Other seborrheic keratosis: Secondary | ICD-10-CM | POA: Diagnosis not present

## 2020-09-16 DIAGNOSIS — L570 Actinic keratosis: Secondary | ICD-10-CM | POA: Diagnosis not present

## 2020-09-21 DIAGNOSIS — M170 Bilateral primary osteoarthritis of knee: Secondary | ICD-10-CM | POA: Diagnosis not present

## 2020-09-24 ENCOUNTER — Encounter: Payer: Self-pay | Admitting: Neurology

## 2020-09-30 ENCOUNTER — Encounter: Payer: Self-pay | Admitting: Neurology

## 2020-10-04 ENCOUNTER — Other Ambulatory Visit: Payer: Self-pay | Admitting: Neurology

## 2020-10-08 DIAGNOSIS — N1831 Chronic kidney disease, stage 3a: Secondary | ICD-10-CM | POA: Diagnosis not present

## 2020-10-08 DIAGNOSIS — E7800 Pure hypercholesterolemia, unspecified: Secondary | ICD-10-CM | POA: Diagnosis not present

## 2020-10-08 DIAGNOSIS — E785 Hyperlipidemia, unspecified: Secondary | ICD-10-CM | POA: Diagnosis not present

## 2020-10-08 DIAGNOSIS — I10 Essential (primary) hypertension: Secondary | ICD-10-CM | POA: Diagnosis not present

## 2020-10-08 DIAGNOSIS — M810 Age-related osteoporosis without current pathological fracture: Secondary | ICD-10-CM | POA: Diagnosis not present

## 2020-10-08 DIAGNOSIS — N183 Chronic kidney disease, stage 3 unspecified: Secondary | ICD-10-CM | POA: Diagnosis not present

## 2020-10-08 DIAGNOSIS — G301 Alzheimer's disease with late onset: Secondary | ICD-10-CM | POA: Diagnosis not present

## 2020-10-08 DIAGNOSIS — F0280 Dementia in other diseases classified elsewhere without behavioral disturbance: Secondary | ICD-10-CM | POA: Diagnosis not present

## 2020-10-08 DIAGNOSIS — M1990 Unspecified osteoarthritis, unspecified site: Secondary | ICD-10-CM | POA: Diagnosis not present

## 2020-10-08 DIAGNOSIS — F4321 Adjustment disorder with depressed mood: Secondary | ICD-10-CM | POA: Diagnosis not present

## 2020-10-08 DIAGNOSIS — E039 Hypothyroidism, unspecified: Secondary | ICD-10-CM | POA: Diagnosis not present

## 2020-10-20 ENCOUNTER — Other Ambulatory Visit: Payer: Self-pay

## 2020-10-20 ENCOUNTER — Telehealth: Payer: Self-pay | Admitting: Neurology

## 2020-10-20 DIAGNOSIS — Z23 Encounter for immunization: Secondary | ICD-10-CM | POA: Diagnosis not present

## 2020-10-20 MED ORDER — RIVASTIGMINE TARTRATE 1.5 MG PO CAPS: ORAL_CAPSULE | ORAL | 0 refills | Status: DC

## 2020-10-20 NOTE — Telephone Encounter (Signed)
Patient's daughter called and said the patient has had several episodes of diarrhea since starting her her memory medication. She's like to speak with a nurse about this. "Yesterday she had a really unclear day and didn't recognize me as her daughter. I'd like someone to call me back."  [facility]

## 2020-10-20 NOTE — Telephone Encounter (Signed)
Pls have her go back down to 1.5mg  BID, pls send Rx if needed. Update in a week on lower dose. Thanks

## 2020-10-20 NOTE — Telephone Encounter (Signed)
Spoke with pt daughter to have her go back down to 1.5mg  BID,new RX sent to [facility] . Pt daughter will call in with Update in a week on lower dose

## 2020-10-20 NOTE — Telephone Encounter (Signed)
Increased Rivastigmine to 3mg  BID has had diarrhea off an on. Yesterday pt was out of it she was very spacey wondering if increase in medication was to much.

## 2020-10-25 DIAGNOSIS — M19071 Primary osteoarthritis, right ankle and foot: Secondary | ICD-10-CM | POA: Diagnosis not present

## 2020-10-25 DIAGNOSIS — N1831 Chronic kidney disease, stage 3a: Secondary | ICD-10-CM | POA: Diagnosis not present

## 2020-10-25 DIAGNOSIS — M19072 Primary osteoarthritis, left ankle and foot: Secondary | ICD-10-CM | POA: Diagnosis not present

## 2020-10-25 DIAGNOSIS — M792 Neuralgia and neuritis, unspecified: Secondary | ICD-10-CM | POA: Diagnosis not present

## 2020-10-25 DIAGNOSIS — I739 Peripheral vascular disease, unspecified: Secondary | ICD-10-CM | POA: Diagnosis not present

## 2020-10-25 DIAGNOSIS — I10 Essential (primary) hypertension: Secondary | ICD-10-CM | POA: Diagnosis not present

## 2020-10-25 DIAGNOSIS — F0280 Dementia in other diseases classified elsewhere without behavioral disturbance: Secondary | ICD-10-CM | POA: Diagnosis not present

## 2020-10-25 DIAGNOSIS — E785 Hyperlipidemia, unspecified: Secondary | ICD-10-CM | POA: Diagnosis not present

## 2020-10-25 DIAGNOSIS — E7800 Pure hypercholesterolemia, unspecified: Secondary | ICD-10-CM | POA: Diagnosis not present

## 2020-10-25 DIAGNOSIS — N183 Chronic kidney disease, stage 3 unspecified: Secondary | ICD-10-CM | POA: Diagnosis not present

## 2020-10-25 DIAGNOSIS — G301 Alzheimer's disease with late onset: Secondary | ICD-10-CM | POA: Diagnosis not present

## 2020-10-25 DIAGNOSIS — M810 Age-related osteoporosis without current pathological fracture: Secondary | ICD-10-CM | POA: Diagnosis not present

## 2020-10-25 DIAGNOSIS — E039 Hypothyroidism, unspecified: Secondary | ICD-10-CM | POA: Diagnosis not present

## 2020-10-25 DIAGNOSIS — F4321 Adjustment disorder with depressed mood: Secondary | ICD-10-CM | POA: Diagnosis not present

## 2020-10-25 DIAGNOSIS — M1990 Unspecified osteoarthritis, unspecified site: Secondary | ICD-10-CM | POA: Diagnosis not present

## 2020-10-30 ENCOUNTER — Other Ambulatory Visit: Payer: Self-pay | Admitting: Neurology

## 2020-11-03 ENCOUNTER — Other Ambulatory Visit: Payer: Self-pay | Admitting: Neurology

## 2020-11-11 DIAGNOSIS — I70203 Unspecified atherosclerosis of native arteries of extremities, bilateral legs: Secondary | ICD-10-CM | POA: Diagnosis not present

## 2020-11-11 DIAGNOSIS — I739 Peripheral vascular disease, unspecified: Secondary | ICD-10-CM | POA: Diagnosis not present

## 2020-11-16 DIAGNOSIS — N183 Chronic kidney disease, stage 3 unspecified: Secondary | ICD-10-CM | POA: Diagnosis not present

## 2020-11-16 DIAGNOSIS — M810 Age-related osteoporosis without current pathological fracture: Secondary | ICD-10-CM | POA: Diagnosis not present

## 2020-11-16 DIAGNOSIS — I10 Essential (primary) hypertension: Secondary | ICD-10-CM | POA: Diagnosis not present

## 2020-11-16 DIAGNOSIS — N1831 Chronic kidney disease, stage 3a: Secondary | ICD-10-CM | POA: Diagnosis not present

## 2020-11-16 DIAGNOSIS — G301 Alzheimer's disease with late onset: Secondary | ICD-10-CM | POA: Diagnosis not present

## 2020-11-16 DIAGNOSIS — E785 Hyperlipidemia, unspecified: Secondary | ICD-10-CM | POA: Diagnosis not present

## 2020-11-16 DIAGNOSIS — F0280 Dementia in other diseases classified elsewhere without behavioral disturbance: Secondary | ICD-10-CM | POA: Diagnosis not present

## 2020-11-16 DIAGNOSIS — E7800 Pure hypercholesterolemia, unspecified: Secondary | ICD-10-CM | POA: Diagnosis not present

## 2020-11-16 DIAGNOSIS — E039 Hypothyroidism, unspecified: Secondary | ICD-10-CM | POA: Diagnosis not present

## 2020-11-29 ENCOUNTER — Other Ambulatory Visit: Payer: Self-pay | Admitting: Neurology

## 2020-12-04 ENCOUNTER — Ambulatory Visit (HOSPITAL_COMMUNITY): Admit: 2020-12-04 | Discharge status: Against Medical Advice | Payer: Medicare Other

## 2020-12-06 ENCOUNTER — Ambulatory Visit (HOSPITAL_COMMUNITY): Payer: Self-pay

## 2020-12-06 ENCOUNTER — Other Ambulatory Visit: Payer: Self-pay | Admitting: Physician Assistant

## 2020-12-06 ENCOUNTER — Ambulatory Visit
Admission: RE | Admit: 2020-12-06 | Discharge: 2020-12-06 | Disposition: A | Discharge status: Home / Self Care | Payer: Medicare Other | Source: Ambulatory Visit | Attending: Physician Assistant | Admitting: Physician Assistant

## 2020-12-06 DIAGNOSIS — S8992XA Unspecified injury of left lower leg, initial encounter: Secondary | ICD-10-CM

## 2020-12-06 DIAGNOSIS — M7989 Other specified soft tissue disorders: Secondary | ICD-10-CM | POA: Diagnosis not present

## 2020-12-06 DIAGNOSIS — M1712 Unilateral primary osteoarthritis, left knee: Secondary | ICD-10-CM | POA: Diagnosis not present

## 2020-12-06 DIAGNOSIS — R238 Other skin changes: Secondary | ICD-10-CM | POA: Diagnosis not present

## 2020-12-13 DIAGNOSIS — S51859A Open bite of unspecified forearm, initial encounter: Secondary | ICD-10-CM | POA: Diagnosis not present

## 2020-12-13 DIAGNOSIS — I739 Peripheral vascular disease, unspecified: Secondary | ICD-10-CM | POA: Diagnosis not present

## 2020-12-13 DIAGNOSIS — S81812D Laceration without foreign body, left lower leg, subsequent encounter: Secondary | ICD-10-CM | POA: Diagnosis not present

## 2020-12-20 ENCOUNTER — Other Ambulatory Visit (HOSPITAL_COMMUNITY): Payer: Self-pay | Admitting: Surgery

## 2020-12-20 ENCOUNTER — Inpatient Hospital Stay (HOSPITAL_COMMUNITY): Admission: RE | Admit: 2020-12-20 | Payer: Medicare Other | Source: Ambulatory Visit

## 2020-12-20 ENCOUNTER — Encounter: Payer: Medicare Other | Admitting: Surgery

## 2020-12-20 DIAGNOSIS — I739 Peripheral vascular disease, unspecified: Secondary | ICD-10-CM

## 2020-12-22 DIAGNOSIS — M170 Bilateral primary osteoarthritis of knee: Secondary | ICD-10-CM | POA: Diagnosis not present

## 2020-12-23 ENCOUNTER — Ambulatory Visit (INDEPENDENT_AMBULATORY_CARE_PROVIDER_SITE_OTHER): Payer: Medicare Other | Admitting: Orthopedic Surgery

## 2020-12-23 DIAGNOSIS — I83022 Varicose veins of left lower extremity with ulcer of calf: Secondary | ICD-10-CM

## 2020-12-23 DIAGNOSIS — L97929 Non-pressure chronic ulcer of unspecified part of left lower leg with unspecified severity: Secondary | ICD-10-CM

## 2020-12-23 DIAGNOSIS — I83029 Varicose veins of left lower extremity with ulcer of unspecified site: Secondary | ICD-10-CM

## 2020-12-24 ENCOUNTER — Encounter: Payer: Self-pay | Admitting: Orthopedic Surgery

## 2020-12-24 NOTE — Progress Notes (Signed)
Office Visit Note   Patient: Renee Zuniga           Date of Birth: [birth year]           MRN: [id number] Visit Date: 12/23/2020              Requested by: Seward Carol, MD [address] [place],  [place] [address] PCP: Seward Carol, MD  Chief Complaint  Patient presents with  . Left Leg - Open Wound      HPI: Patient is an 87 year old woman with venous insufficiency as well as an irregular heart rate.  She complains of increased pain and swelling and ulceration to the left leg with skin color changes.  Assessment & Plan: Visit Diagnoses:  1. Venous ulcer of left leg (HCC)     Plan: Recommended compression for the venous ulcers left leg and venous swelling of the right leg.  Discussed that since her leg is so sensitive we could start with compression wraps and then advance to compression stockings once she feels comfortable with compression.  After patient's leg was wrapped she had increased pain.  We again reviewed treatment options with starting with the compression stocking versus continue with the Dynaflex wrap.  Patient states she would like to try the Dynaflex wrap and will remove it if it becomes too painful.  Patient states she wants to travel in the near future and recommended against traveling with her leg dependent discussed the importance of keeping her foot elevated with her heart and moving the ankle to work the calf pump.  Follow-Up Instructions: Return in about 1 week (around 12/30/2020).   Ortho Exam  Patient is alert, oriented, no adenopathy, well-dressed, normal affect, normal respiratory effort. Examination patient has brawny skin color changes of both legs with venous ulcer on the left leg 6 x 3.5 cm.  There is no cellulitis no signs of infection.  Patient's legs are tender to palpation secondary to the swelling.  Left leg is 36 cm in circumference right leg is 35 cm in circumference.  A Doppler was used and she has a strong triphasic dorsalis  pedis pulse and a biphasic posterior tibial pulse.  She does have a palpable dorsalis pedis pulse.  Her heart rate is irregular.  Imaging: No results found. No images are attached to the encounter.  Labs: Lab Results  Component Value Date   HGBA1C 6.0 (H) 11/10/2014   REPTSTATUS 08/20/2016 FINAL 08/19/2016   CULT MULTIPLE SPECIES PRESENT, SUGGEST RECOLLECTION (A) 08/19/2016     Lab Results  Component Value Date   ALBUMIN 3.6 11/25/2018   ALBUMIN 4.0 08/19/2016   ALBUMIN 4.0 11/09/2014    No results found for: MG No results found for: VD25OH  No results found for: PREALBUMIN CBC EXTENDED Latest Ref Rng & Units 11/26/2018 11/25/2018 08/20/2016  WBC 4.0 - 10.5 K/uL 8.0 9.7 6.4  RBC 3.87 - 5.11 MIL/uL 3.67(L) 3.70(L) 4.05  HGB 12.0 - 15.0 g/dL 11.2(L) 11.5(L) 12.3  HCT 36.0 - 46.0 % 35.7(L) 36.2 38.9  PLT 150 - 400 K/uL 220 204 255  NEUTROABS 1.7 - 7.7 K/uL - 7.5 -  LYMPHSABS 0.7 - 4.0 K/uL - 1.4 -     There is no height or weight on file to calculate BMI.  Orders:  No orders of the defined types were placed in this encounter.  No orders of the defined types were placed in this encounter.    Procedures: No procedures performed  Clinical Data: No additional findings.  ROS:  All other systems negative, except as noted in the HPI. Review of Systems  Objective: Vital Signs: There were no vitals taken for this visit.  Specialty Comments:  No specialty comments available.  PMFS History: Patient Active Problem List   Diagnosis Date Noted  . Pelvis fracture ([place]) 11/26/2018  . Mild neurocognitive disorder, amnestic type 08/22/2017  . Aphasia 08/19/2016  . Localization-related idiopathic epilepsy and epileptic syndromes with seizures of localized onset, not intractable, without status epilepticus ([place]) 08/12/2015  . TIA (transient ischemic attack) 11/09/2014  . HTN (hypertension) 11/09/2014  . Bilateral foot pain 03/31/2014  . Pes planus 03/31/2014  .  Subluxation of ankle joint 03/31/2014  . Arthritis   . Heart murmur   . Mitral valve prolapse   . Osteoarthritis   . Vitamin D deficiency   . Migraine headache   . Hypercholesteremia   . Tinnitus    Past Medical History:  Diagnosis Date  . Arthritis   . Heart murmur   . High blood pressure   . HTN (hypertension) 11/09/2014  . Hypercholesteremia   . Insomnia   . Localization-related idiopathic epilepsy and epileptic syndromes with seizures of localized onset, not intractable, without status epilepticus ([place]) 08/12/2015  . Migraine headache   . Mild neurocognitive disorder, amnestic type 08/22/2017  . Mitral valve prolapse   . Osteoarthritis   . Osteopenia   . Pelvis fracture ([place]) 11/26/2018  . Seizures ([place])   . TIA (transient ischemic attack) 11/09/2014  . Tinnitus   . Vitamin D deficiency     Family History  Problem Relation Age of Onset  . Mitral valve prolapse Father   . Heart Problems Mother   . Hypertension Mother     Past Surgical History:  Procedure Laterality Date  . CHOLECYSTECTOMY    . CORRECTION HAMMER TOE Bilateral   . GALLBLADDER SURGERY    . OOPHORECTOMY     benign tumor, per patient  . PARTIAL HYSTERECTOMY  1974   due to uterine prolaspe   Social History   Occupational History    Comment: Retired  Tobacco Use  . Smoking status: Never Smoker  . Smokeless tobacco: Never Used  Vaping Use  . Vaping Use: Never used  Substance and Sexual Activity  . Alcohol use: Yes    Alcohol/week: 7.0 standard drinks    Types: 7 Glasses of wine per week    Comment: 1 glass of wine nightly with dinner  . Drug use: No  . Sexual activity: Not Currently

## 2020-12-27 ENCOUNTER — Other Ambulatory Visit: Payer: Self-pay | Admitting: Neurology

## 2020-12-29 ENCOUNTER — Telehealth: Payer: Self-pay | Admitting: Orthopedic Surgery

## 2020-12-29 NOTE — Telephone Encounter (Signed)
Holding to discuss at appt tomorrow.

## 2020-12-29 NOTE — Telephone Encounter (Signed)
Patient's daughter Jera Headings called requesting a FL2 form be sent to [employer] to attention Loree Fee (case [employer]ight forwarder). Please contact Whitney at [phone] for proper forms to be sent on behalf of patient. Terica Yogi phone number is [phone].

## 2020-12-30 ENCOUNTER — Encounter: Payer: Self-pay | Admitting: Physician Assistant

## 2020-12-30 ENCOUNTER — Ambulatory Visit (INDEPENDENT_AMBULATORY_CARE_PROVIDER_SITE_OTHER): Payer: Medicare Other | Admitting: Orthopedic Surgery

## 2020-12-30 DIAGNOSIS — I70234 Atherosclerosis of native arteries of right leg with ulceration of heel and midfoot: Secondary | ICD-10-CM

## 2020-12-30 DIAGNOSIS — I83029 Varicose veins of left lower extremity with ulcer of unspecified site: Secondary | ICD-10-CM

## 2020-12-30 DIAGNOSIS — L97929 Non-pressure chronic ulcer of unspecified part of left lower leg with unspecified severity: Secondary | ICD-10-CM | POA: Diagnosis not present

## 2020-12-30 MED ORDER — PENTOXIFYLLINE ER 400 MG PO TBCR: 400.0000 mg | EXTENDED_RELEASE_TABLET | Freq: Three times a day (TID) | ORAL | 3 refills | Status: DC

## 2020-12-30 NOTE — Progress Notes (Signed)
Office Visit Note   Patient: Renee Zuniga           Date of Birth: Jan 04, 1931           MRN: [id number] Visit Date: 12/30/2020              Requested by: Seward Carol, MD [address] [place],   [address] PCP: Seward Carol, MD  Chief Complaint  Patient presents with  . Left Leg - Follow-up      HPI: Patient is an 87 year old woman who presents in follow-up status post compression wrapping for venous insufficiency ulceration.  Patient has started wearing the compression sock she states she can wear it during the day but at night it is too painful.  Assessment & Plan: Visit Diagnoses:  1. Venous ulcer of left leg ([place])   2. Atherosclerosis of native arteries of right leg with ulceration of heel and midfoot ([place])     Plan: Recommend she continue wearing the compression sock during the day use dry gauze and an Ace wrap at night.  Patient does have difficulty with wound care and has difficulty with ambulation.  Patient will require discharge to skilled nursing to facilitate her medical management.  We will investigate what options are available to get her into skilled nursing.  Follow-Up Instructions: Return in about 2 weeks (around 01/13/2021).   Ortho Exam  Patient is alert, oriented, no adenopathy, well-dressed, normal affect, normal respiratory effort. Examination there is venous swelling in the left leg.  The ulcer is 3 x 5 cm and is flat there is healthy granulation tissue.  We will add Trental to help improve her microcirculation.  The compression sock was applied without complications patient has been wearing a dressing beneath the compression sock and recommended she wear the compression sock directly against the wound.  Patient currently has care available during the day and will need 24 hours a day care to optimize her treatment.  Imaging: No results found. No images are attached to the encounter.  Labs: Lab Results  Component Value Date    HGBA1C 6.0 (H) 11/10/2014   REPTSTATUS 08/20/2016 FINAL 08/19/2016   CULT MULTIPLE SPECIES PRESENT, SUGGEST RECOLLECTION (A) 08/19/2016     Lab Results  Component Value Date   ALBUMIN 3.6 11/25/2018   ALBUMIN 4.0 08/19/2016   ALBUMIN 4.0 11/09/2014    No results found for: MG No results found for: VD25OH  No results found for: PREALBUMIN CBC EXTENDED Latest Ref Rng & Units 11/26/2018 11/25/2018 08/20/2016  WBC 4.0 - 10.5 K/uL 8.0 9.7 6.4  RBC 3.87 - 5.11 MIL/uL 3.67(L) 3.70(L) 4.05  HGB 12.0 - 15.0 g/dL 11.2(L) 11.5(L) 12.3  HCT 36.0 - 46.0 % 35.7(L) 36.2 38.9  PLT 150 - 400 K/uL 220 204 255  NEUTROABS 1.7 - 7.7 K/uL - 7.5 -  LYMPHSABS 0.7 - 4.0 K/uL - 1.4 -     There is no height or weight on file to calculate BMI.  Orders:  No orders of the defined types were placed in this encounter.  Meds ordered this encounter  Medications  . pentoxifylline (TRENTAL) 400 MG CR tablet    Sig: Take 1 tablet (400 mg total) by mouth 3 (three) times daily with meals.    Dispense:  90 tablet    Refill:  3     Procedures: No procedures performed  Clinical Data: No additional findings.  ROS:  All other systems negative, except as noted in the  HPI. Review of Systems  Objective: Vital Signs: There were no vitals taken for this visit.  Specialty Comments:  No specialty comments available.  PMFS History: Patient Active Problem List   Diagnosis Date Noted  . Pelvis fracture ([place]) 11/26/2018  . Mild neurocognitive disorder, amnestic type 08/22/2017  . Aphasia 08/19/2016  . Localization-related idiopathic epilepsy and epileptic syndromes with seizures of localized onset, not intractable, without status epilepticus ([place]) 08/12/2015  . TIA (transient ischemic attack) 11/09/2014  . HTN (hypertension) 11/09/2014  . Bilateral foot pain 03/31/2014  . Pes planus 03/31/2014  . Subluxation of ankle joint 03/31/2014  . Arthritis   . Heart murmur   . Mitral valve prolapse   .  Osteoarthritis   . Vitamin D deficiency   . Migraine headache   . Hypercholesteremia   . Tinnitus    Past Medical History:  Diagnosis Date  . Arthritis   . Heart murmur   . High blood pressure   . HTN (hypertension) 11/09/2014  . Hypercholesteremia   . Insomnia   . Localization-related idiopathic epilepsy and epileptic syndromes with seizures of localized onset, not intractable, without status epilepticus ([place]) 08/12/2015  . Migraine headache   . Mild neurocognitive disorder, amnestic type 08/22/2017  . Mitral valve prolapse   . Osteoarthritis   . Osteopenia   . Pelvis fracture ([place]) 11/26/2018  . Seizures ([place])   . TIA (transient ischemic attack) 11/09/2014  . Tinnitus   . Vitamin D deficiency     Family History  Problem Relation Age of Onset  . Mitral valve prolapse Father   . Heart Problems Mother   . Hypertension Mother     Past Surgical History:  Procedure Laterality Date  . CHOLECYSTECTOMY    . CORRECTION HAMMER TOE Bilateral   . GALLBLADDER SURGERY    . OOPHORECTOMY     benign tumor, per patient  . PARTIAL HYSTERECTOMY  1974   due to uterine prolaspe   Social History   Occupational History    Comment: Retired  Tobacco Use  . Smoking status: Never Smoker  . Smokeless tobacco: Never Used  Vaping Use  . Vaping Use: Never used  Substance and Sexual Activity  . Alcohol use: Yes    Alcohol/week: 7.0 standard drinks    Types: 7 Glasses of wine per week    Comment: 1 glass of wine nightly with dinner  . Drug use: No  . Sexual activity: Not Currently

## 2020-12-31 NOTE — Telephone Encounter (Signed)
Called pt's daughter lm on vm and advised that after review of the FL2 this needs to be completed by the pt's PCP as the attending provider. To call with any questions.

## 2020-12-31 NOTE — Telephone Encounter (Signed)
I called and sw Whitney and she will fax over an [facility] for completion. Can you please let me know when this comes in so I can have Dr. Sharol Given to sign?

## 2020-12-31 NOTE — Telephone Encounter (Signed)
Received. Bringing to you now.

## 2021-01-04 DIAGNOSIS — N8111 Cystocele, midline: Secondary | ICD-10-CM | POA: Diagnosis not present

## 2021-01-04 DIAGNOSIS — N390 Urinary tract infection, site not specified: Secondary | ICD-10-CM | POA: Diagnosis not present

## 2021-01-05 ENCOUNTER — Encounter: Payer: Self-pay | Admitting: Physician Assistant

## 2021-01-05 ENCOUNTER — Ambulatory Visit (INDEPENDENT_AMBULATORY_CARE_PROVIDER_SITE_OTHER): Payer: Medicare Other | Admitting: Physician Assistant

## 2021-01-05 DIAGNOSIS — L97929 Non-pressure chronic ulcer of unspecified part of left lower leg with unspecified severity: Secondary | ICD-10-CM

## 2021-01-05 DIAGNOSIS — I83029 Varicose veins of left lower extremity with ulcer of unspecified site: Secondary | ICD-10-CM

## 2021-01-05 NOTE — Progress Notes (Signed)
Office Visit Note   Patient: Renee Zuniga           Date of Birth: [birth year]           MRN: [id number] Visit Date: 01/05/2021              Requested by: Seward Carol, MD [address] [place],  [place] [address] PCP: Seward Carol, MD  Chief Complaint  Patient presents with  . Left Leg - Wound Check      HPI: Patient presents today for follow-up on her left leg ulcer.  She was seen 6 days ago and was placed on compression socks.  She has had difficulty tolerating these at night per her daughter and her caregivers and she removes the sock.  Her daughter is wondering if she could be rewrapped in a compression wrap today with silver cell while she buys her more socks because she is afraid that she is also putting the socks on when there wet  Assessment & Plan: Visit Diagnoses: No diagnosis found.  Plan: Patient will return in 1 week and already has an appointment.  I informed her daughter that if she takes the wrap off to simply put the stocking back on.  Follow-Up Instructions: No follow-ups on file.   Ortho Exam  Patient is alert, oriented, no adenopathy, well-dressed, normal affect, normal respiratory effort. Left lower extremity has a 3 x 5 ulcer.  75% of the wound has healthy vascular tissue.  Other has fibrinous tissue.  She does not let me debride more because she says it hurts too much.  There is no foul odor no surrounding cellulitis.  Mild erythema on the periphery of the wound  Imaging: No results found. No images are attached to the encounter.  Labs: Lab Results  Component Value Date   HGBA1C 6.0 (H) 11/10/2014   REPTSTATUS 08/20/2016 FINAL 08/19/2016   CULT MULTIPLE SPECIES PRESENT, SUGGEST RECOLLECTION (A) 08/19/2016     Lab Results  Component Value Date   ALBUMIN 3.6 11/25/2018   ALBUMIN 4.0 08/19/2016   ALBUMIN 4.0 11/09/2014    No results found for: MG No results found for: VD25OH  No results found for: PREALBUMIN CBC  EXTENDED Latest Ref Rng & Units 11/26/2018 11/25/2018 08/20/2016  WBC 4.0 - 10.5 K/uL 8.0 9.7 6.4  RBC 3.87 - 5.11 MIL/uL 3.67(L) 3.70(L) 4.05  HGB 12.0 - 15.0 g/dL 11.2(L) 11.5(L) 12.3  HCT 36.0 - 46.0 % 35.7(L) 36.2 38.9  PLT 150 - 400 K/uL 220 204 255  NEUTROABS 1.7 - 7.7 K/uL - 7.5 -  LYMPHSABS 0.7 - 4.0 K/uL - 1.4 -     There is no height or weight on file to calculate BMI.  Orders:  No orders of the defined types were placed in this encounter.  No orders of the defined types were placed in this encounter.    Procedures: No procedures performed  Clinical Data: No additional findings.  ROS:  All other systems negative, except as noted in the HPI. Review of Systems  Objective: Vital Signs: There were no vitals taken for this visit.  Specialty Comments:  No specialty comments available.  PMFS History: Patient Active Problem List   Diagnosis Date Noted  . Pelvis fracture ([place]) 11/26/2018  . Mild neurocognitive disorder, amnestic type 08/22/2017  . Aphasia 08/19/2016  . Localization-related idiopathic epilepsy and epileptic syndromes with seizures of localized onset, not intractable, without status epilepticus ([place]) 08/12/2015  . TIA (transient ischemic  attack) 11/09/2014  . HTN (hypertension) 11/09/2014  . Bilateral foot pain 03/31/2014  . Pes planus 03/31/2014  . Subluxation of ankle joint 03/31/2014  . Arthritis   . Heart murmur   . Mitral valve prolapse   . Osteoarthritis   . Vitamin D deficiency   . Migraine headache   . Hypercholesteremia   . Tinnitus    Past Medical History:  Diagnosis Date  . Arthritis   . Heart murmur   . High blood pressure   . HTN (hypertension) 11/09/2014  . Hypercholesteremia   . Insomnia   . Localization-related idiopathic epilepsy and epileptic syndromes with seizures of localized onset, not intractable, without status epilepticus ([place]) 08/12/2015  . Migraine headache   . Mild neurocognitive disorder, amnestic type  08/22/2017  . Mitral valve prolapse   . Osteoarthritis   . Osteopenia   . Pelvis fracture ([place]) 11/26/2018  . Seizures ([place])   . TIA (transient ischemic attack) 11/09/2014  . Tinnitus   . Vitamin D deficiency     Family History  Problem Relation Age of Onset  . Mitral valve prolapse Father   . Heart Problems Mother   . Hypertension Mother     Past Surgical History:  Procedure Laterality Date  . CHOLECYSTECTOMY    . CORRECTION HAMMER TOE Bilateral   . GALLBLADDER SURGERY    . OOPHORECTOMY     benign tumor, per patient  . PARTIAL HYSTERECTOMY  1974   due to uterine prolaspe   Social History   Occupational History    Comment: Retired  Tobacco Use  . Smoking status: Never Smoker  . Smokeless tobacco: Never Used  Vaping Use  . Vaping Use: Never used  Substance and Sexual Activity  . Alcohol use: Yes    Alcohol/week: 7.0 standard drinks    Types: 7 Glasses of wine per week    Comment: 1 glass of wine nightly with dinner  . Drug use: No  . Sexual activity: Not Currently

## 2021-01-06 DIAGNOSIS — I10 Essential (primary) hypertension: Secondary | ICD-10-CM | POA: Diagnosis not present

## 2021-01-06 DIAGNOSIS — M1990 Unspecified osteoarthritis, unspecified site: Secondary | ICD-10-CM | POA: Diagnosis not present

## 2021-01-06 DIAGNOSIS — M810 Age-related osteoporosis without current pathological fracture: Secondary | ICD-10-CM | POA: Diagnosis not present

## 2021-01-06 DIAGNOSIS — N183 Chronic kidney disease, stage 3 unspecified: Secondary | ICD-10-CM | POA: Diagnosis not present

## 2021-01-06 DIAGNOSIS — E7800 Pure hypercholesterolemia, unspecified: Secondary | ICD-10-CM | POA: Diagnosis not present

## 2021-01-06 DIAGNOSIS — E039 Hypothyroidism, unspecified: Secondary | ICD-10-CM | POA: Diagnosis not present

## 2021-01-06 DIAGNOSIS — E785 Hyperlipidemia, unspecified: Secondary | ICD-10-CM | POA: Diagnosis not present

## 2021-01-06 DIAGNOSIS — N1831 Chronic kidney disease, stage 3a: Secondary | ICD-10-CM | POA: Diagnosis not present

## 2021-01-06 DIAGNOSIS — F4321 Adjustment disorder with depressed mood: Secondary | ICD-10-CM | POA: Diagnosis not present

## 2021-01-06 DIAGNOSIS — G301 Alzheimer's disease with late onset: Secondary | ICD-10-CM | POA: Diagnosis not present

## 2021-01-06 DIAGNOSIS — F0280 Dementia in other diseases classified elsewhere without behavioral disturbance: Secondary | ICD-10-CM | POA: Diagnosis not present

## 2021-01-10 ENCOUNTER — Other Ambulatory Visit: Payer: Self-pay

## 2021-01-10 DIAGNOSIS — M79672 Pain in left foot: Secondary | ICD-10-CM

## 2021-01-10 DIAGNOSIS — M79671 Pain in right foot: Secondary | ICD-10-CM

## 2021-01-13 ENCOUNTER — Ambulatory Visit (INDEPENDENT_AMBULATORY_CARE_PROVIDER_SITE_OTHER): Payer: Medicare Other | Admitting: Physician Assistant

## 2021-01-13 ENCOUNTER — Encounter: Payer: Self-pay | Admitting: Orthopedic Surgery

## 2021-01-13 DIAGNOSIS — L97929 Non-pressure chronic ulcer of unspecified part of left lower leg with unspecified severity: Secondary | ICD-10-CM

## 2021-01-13 DIAGNOSIS — I83028 Varicose veins of left lower extremity with ulcer other part of lower leg: Secondary | ICD-10-CM | POA: Diagnosis not present

## 2021-01-13 DIAGNOSIS — I83029 Varicose veins of left lower extremity with ulcer of unspecified site: Secondary | ICD-10-CM

## 2021-01-13 NOTE — Progress Notes (Signed)
Office Visit Note   Patient: Renee Zuniga           Date of Birth: 24-Apr-1931           MRN: [id number] Visit Date: 01/13/2021              Requested by: Seward Carol, MD [address] [place],  [place] [address] PCP: Seward Carol, MD  Chief Complaint  Patient presents with  . Left Leg - Follow-up      HPI: Patient presents today for follow-up for her left lower extremity ulcer.  She has a history of bilateral lower extremity venous stasis disease.  She has been having difficulty tolerating both the socks and the compression wrap.  She has been per her report keeping sock on the right.  The left side we placed her in a compression wrap and her daughter states she did keep this on until recently but then cut the top of it because it was "too tight "her daughter is requesting another compression wrap  Assessment & Plan: Visit Diagnoses: No diagnosis found.  Plan: We will place her in a compression wrap with silver cell over the wound.  Hopefully next week we could try an have her wear her compression stocking.  Follow-Up Instructions: No follow-ups on file.   Ortho Exam  Patient is alert, oriented, no adenopathy, well-dressed, normal affect, normal respiratory effort. Focused examination of the left lower extremity demonstrates wrinkling of the skin.  She does have an area we could tell the wrap was pushed down and there is some swelling above that.  No cellulitis no erythema.  The ulcer has about 675% healthy granulation to a few intermixed with some fibrinous tissue.  There is no foul odor no significant drainage no surrounding cellulitis or erythema right lower extremity has some brawny skin changes but is otherwise in good condition without any skin breakdown no evidence of any infection  Imaging: No results found. No images are attached to the encounter.  Labs: Lab Results  Component Value Date   HGBA1C 6.0 (H) 11/10/2014   REPTSTATUS 08/20/2016 FINAL  08/19/2016   CULT MULTIPLE SPECIES PRESENT, SUGGEST RECOLLECTION (A) 08/19/2016     Lab Results  Component Value Date   ALBUMIN 3.6 11/25/2018   ALBUMIN 4.0 08/19/2016   ALBUMIN 4.0 11/09/2014    No results found for: MG No results found for: VD25OH  No results found for: PREALBUMIN CBC EXTENDED Latest Ref Rng & Units 11/26/2018 11/25/2018 08/20/2016  WBC 4.0 - 10.5 K/uL 8.0 9.7 6.4  RBC 3.87 - 5.11 MIL/uL 3.67(L) 3.70(L) 4.05  HGB 12.0 - 15.0 g/dL 11.2(L) 11.5(L) 12.3  HCT 36.0 - 46.0 % 35.7(L) 36.2 38.9  PLT 150 - 400 K/uL 220 204 255  NEUTROABS 1.7 - 7.7 K/uL - 7.5 -  LYMPHSABS 0.7 - 4.0 K/uL - 1.4 -     There is no height or weight on file to calculate BMI.  Orders:  No orders of the defined types were placed in this encounter.  No orders of the defined types were placed in this encounter.    Procedures: No procedures performed  Clinical Data: No additional findings.  ROS:  All other systems negative, except as noted in the HPI. Review of Systems  Objective: Vital Signs: There were no vitals taken for this visit.  Specialty Comments:  No specialty comments available.  PMFS History: Patient Active Problem List   Diagnosis Date Noted  . Pelvis  fracture ([place]) 11/26/2018  . Mild neurocognitive disorder, amnestic type 08/22/2017  . Aphasia 08/19/2016  . Localization-related idiopathic epilepsy and epileptic syndromes with seizures of localized onset, not intractable, without status epilepticus ([place]) 08/12/2015  . TIA (transient ischemic attack) 11/09/2014  . HTN (hypertension) 11/09/2014  . Bilateral foot pain 03/31/2014  . Pes planus 03/31/2014  . Subluxation of ankle joint 03/31/2014  . Arthritis   . Heart murmur   . Mitral valve prolapse   . Osteoarthritis   . Vitamin D deficiency   . Migraine headache   . Hypercholesteremia   . Tinnitus    Past Medical History:  Diagnosis Date  . Arthritis   . Heart murmur   . High blood pressure   . HTN  (hypertension) 11/09/2014  . Hypercholesteremia   . Insomnia   . Localization-related idiopathic epilepsy and epileptic syndromes with seizures of localized onset, not intractable, without status epilepticus ([place]) 08/12/2015  . Migraine headache   . Mild neurocognitive disorder, amnestic type 08/22/2017  . Mitral valve prolapse   . Osteoarthritis   . Osteopenia   . Pelvis fracture ([place]) 11/26/2018  . Seizures ([place])   . TIA (transient ischemic attack) 11/09/2014  . Tinnitus   . Vitamin D deficiency     Family History  Problem Relation Age of Onset  . Mitral valve prolapse Father   . Heart Problems Mother   . Hypertension Mother     Past Surgical History:  Procedure Laterality Date  . CHOLECYSTECTOMY    . CORRECTION HAMMER TOE Bilateral   . GALLBLADDER SURGERY    . OOPHORECTOMY     benign tumor, per patient  . PARTIAL HYSTERECTOMY  1974   due to uterine prolaspe   Social History   Occupational History    Comment: Retired  Tobacco Use  . Smoking status: Never Smoker  . Smokeless tobacco: Never Used  Vaping Use  . Vaping Use: Never used  Substance and Sexual Activity  . Alcohol use: Yes    Alcohol/week: 7.0 standard drinks    Types: 7 Glasses of wine per week    Comment: 1 glass of wine nightly with dinner  . Drug use: No  . Sexual activity: Not Currently

## 2021-01-20 ENCOUNTER — Ambulatory Visit (INDEPENDENT_AMBULATORY_CARE_PROVIDER_SITE_OTHER): Payer: Medicare Other | Admitting: Physician Assistant

## 2021-01-20 ENCOUNTER — Encounter: Payer: Self-pay | Admitting: Physician Assistant

## 2021-01-20 DIAGNOSIS — L97929 Non-pressure chronic ulcer of unspecified part of left lower leg with unspecified severity: Secondary | ICD-10-CM

## 2021-01-20 DIAGNOSIS — I83029 Varicose veins of left lower extremity with ulcer of unspecified site: Secondary | ICD-10-CM | POA: Diagnosis not present

## 2021-01-20 NOTE — Progress Notes (Signed)
Office Visit Note   Patient: Renee Zuniga           Date of Birth: March 09, 1931           MRN: [id number] Visit Date: 01/20/2021              Requested by: Seward Carol, MD [address] [place],  [place] [address] PCP: Seward Carol, MD  Chief Complaint  Patient presents with  . Left Leg - Follow-up      HPI: Patient presents today for follow-up on her left lower leg wound.  She was [place] wrapped.  She is accompanied by a caregiver.  Assessment & Plan: Visit Diagnoses: No diagnosis found.  Plan: I discussed with the patient wearing a sock.  She does not remember wearing the sock or taking it off last time she had the chance to.  I emphasized with her care for her that this is a medicated sock and should be changed daily.  We will try to keep a sock on her as much as possible follow-up in 2 weeks  Follow-Up Instructions: No follow-ups on file.   Ortho Exam  Patient is alert, oriented, no adenopathy, well-dressed, normal affect, normal respiratory effort. Examination much decreased swelling in her left lower extremity she has great wrinkling of the skin no cellulitis or signs of infection she does have 1 open ulcer that has fibrinous tissue no foul odor no surrounding cellulitis.  Compartments are soft  Imaging: No results found. No images are attached to the encounter.  Labs: Lab Results  Component Value Date   HGBA1C 6.0 (H) 11/10/2014   REPTSTATUS 08/20/2016 FINAL 08/19/2016   CULT MULTIPLE SPECIES PRESENT, SUGGEST RECOLLECTION (A) 08/19/2016     Lab Results  Component Value Date   ALBUMIN 3.6 11/25/2018   ALBUMIN 4.0 08/19/2016   ALBUMIN 4.0 11/09/2014    No results found for: MG No results found for: VD25OH  No results found for: PREALBUMIN CBC EXTENDED Latest Ref Rng & Units 11/26/2018 11/25/2018 08/20/2016  WBC 4.0 - 10.5 K/uL 8.0 9.7 6.4  RBC 3.87 - 5.11 MIL/uL 3.67(L) 3.70(L) 4.05  HGB 12.0 - 15.0 g/dL 11.2(L) 11.5(L) 12.3  HCT  36.0 - 46.0 % 35.7(L) 36.2 38.9  PLT 150 - 400 K/uL 220 204 255  NEUTROABS 1.7 - 7.7 K/uL - 7.5 -  LYMPHSABS 0.7 - 4.0 K/uL - 1.4 -     There is no height or weight on file to calculate BMI.  Orders:  No orders of the defined types were placed in this encounter.  No orders of the defined types were placed in this encounter.    Procedures: No procedures performed  Clinical Data: No additional findings.  ROS:  All other systems negative, except as noted in the HPI. Review of Systems  Objective: Vital Signs: There were no vitals taken for this visit.  Specialty Comments:  No specialty comments available.  PMFS History: Patient Active Problem List   Diagnosis Date Noted  . Pelvis fracture ([place]) 11/26/2018  . Mild neurocognitive disorder, amnestic type 08/22/2017  . Aphasia 08/19/2016  . Localization-related idiopathic epilepsy and epileptic syndromes with seizures of localized onset, not intractable, without status epilepticus ([place]) 08/12/2015  . TIA (transient ischemic attack) 11/09/2014  . HTN (hypertension) 11/09/2014  . Bilateral foot pain 03/31/2014  . Pes planus 03/31/2014  . Subluxation of ankle joint 03/31/2014  . Arthritis   . Heart murmur   . Mitral valve prolapse   .  Osteoarthritis   . Vitamin D deficiency   . Migraine headache   . Hypercholesteremia   . Tinnitus    Past Medical History:  Diagnosis Date  . Arthritis   . Heart murmur   . High blood pressure   . HTN (hypertension) 11/09/2014  . Hypercholesteremia   . Insomnia   . Localization-related idiopathic epilepsy and epileptic syndromes with seizures of localized onset, not intractable, without status epilepticus ([place]) 08/12/2015  . Migraine headache   . Mild neurocognitive disorder, amnestic type 08/22/2017  . Mitral valve prolapse   . Osteoarthritis   . Osteopenia   . Pelvis fracture ([place]) 11/26/2018  . Seizures ([place])   . TIA (transient ischemic attack) 11/09/2014  . Tinnitus   .  Vitamin D deficiency     Family History  Problem Relation Age of Onset  . Mitral valve prolapse Father   . Heart Problems Mother   . Hypertension Mother     Past Surgical History:  Procedure Laterality Date  . CHOLECYSTECTOMY    . CORRECTION HAMMER TOE Bilateral   . GALLBLADDER SURGERY    . OOPHORECTOMY     benign tumor, per patient  . PARTIAL HYSTERECTOMY  1974   due to uterine prolaspe   Social History   Occupational History    Comment: Retired  Tobacco Use  . Smoking status: Never Smoker  . Smokeless tobacco: Never Used  Vaping Use  . Vaping Use: Never used  Substance and Sexual Activity  . Alcohol use: Yes    Alcohol/week: 7.0 standard drinks    Types: 7 Glasses of wine per week    Comment: 1 glass of wine nightly with dinner  . Drug use: No  . Sexual activity: Not Currently

## 2021-01-24 ENCOUNTER — Telehealth: Payer: Self-pay | Admitting: Neurology

## 2021-01-24 NOTE — Telephone Encounter (Signed)
Patient, did not miss any meds, no changes in sleep or alcohol. Daughter wants to let her go to a facility for memory loss. Her family MD, filled out FL2.

## 2021-01-24 NOTE — Telephone Encounter (Signed)
Patient's daughter called and said the patient had a seizure on 01/07/21. Daughter just wanted Dr. Delice Lesch to be aware of this. She also said, "Dr. Sharol Given has her on a medication for a large wound on her leg. Dr. Delice Lesch may just want to review her medications from recently to be sure everything looks good."   Patient also seen at [facility] recently, Dr. Matilde Sprang, for a urinary tract infection.

## 2021-01-24 NOTE — Telephone Encounter (Signed)
[name] daughter notified.

## 2021-01-24 NOTE — Telephone Encounter (Signed)
Pls let daughter know when there is an infection, this can lower seizure threshold. Would stay on same seizure medication. I reviewed med list, looks okay. Thanks

## 2021-01-26 IMAGING — DX DG TIBIA/FIBULA 2V*L*
4 series · 4 of 4 positions shown · non-contrast
Comparison: None.

CLINICAL DATA: Injury of left lower extremity, initial encounter

EXAM:
LEFT TIBIA AND FIBULA - 2 VIEW

[dg tibia/fibula left (1 of 4)]
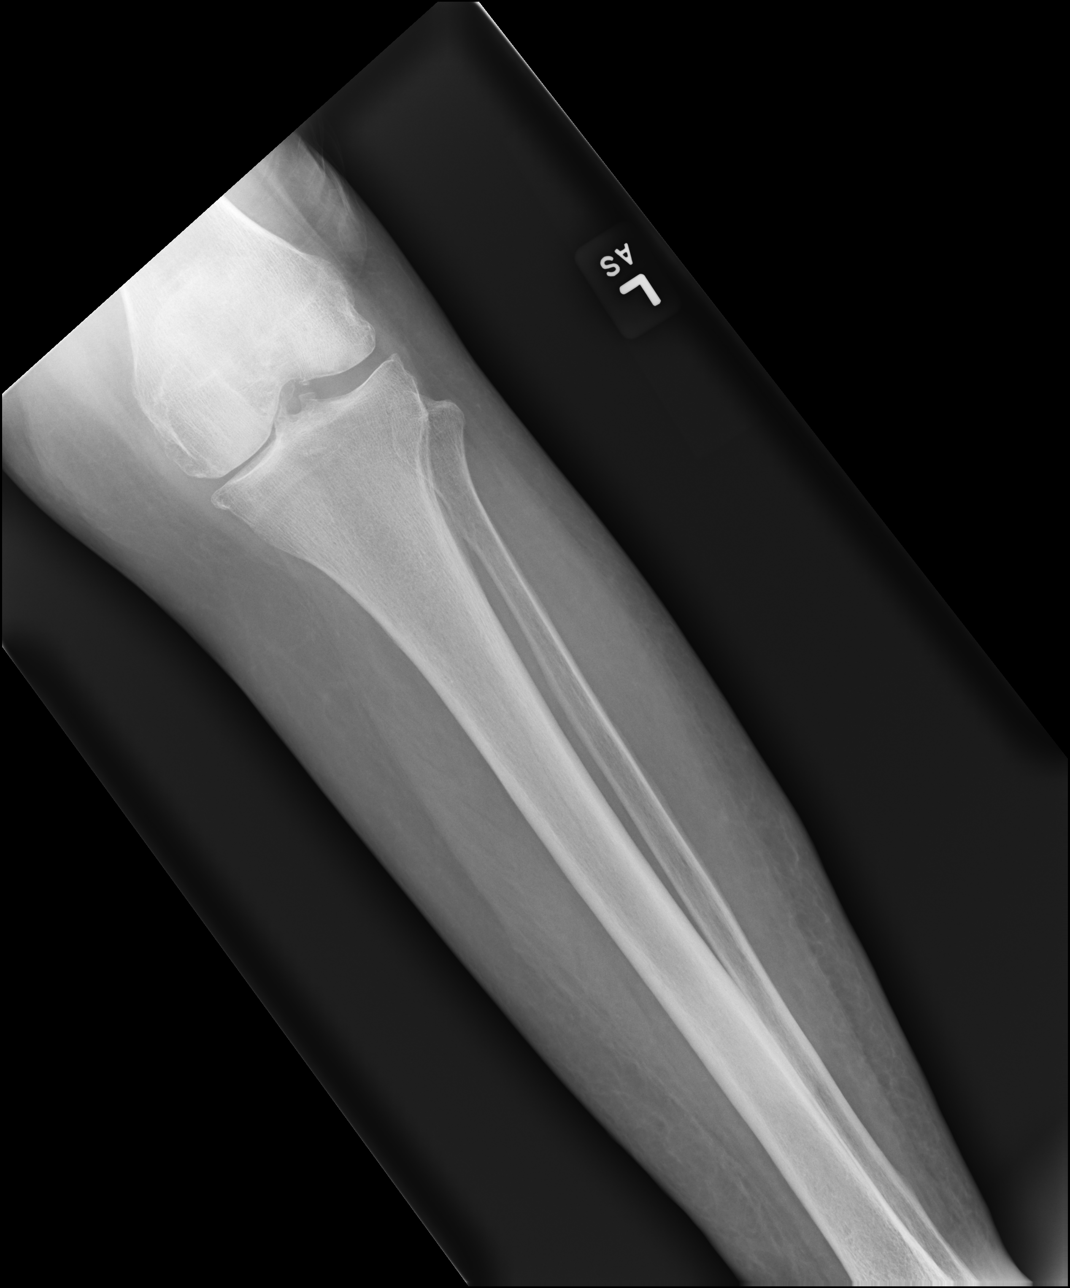

[dg tibia/fibula left (2 of 4)]
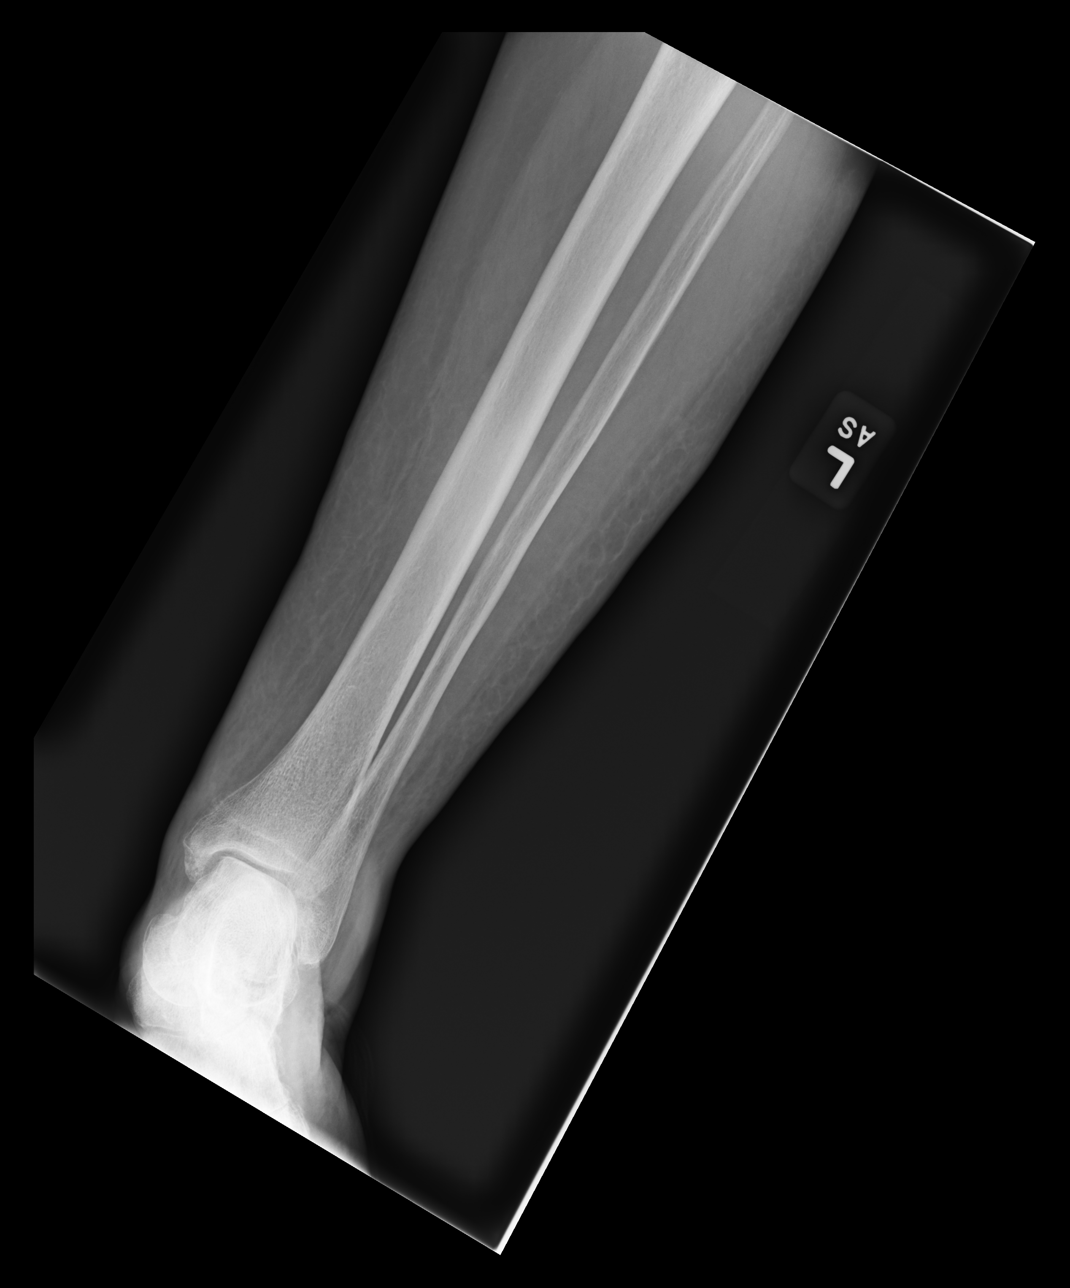

[dg tibia/fibula left (3 of 4)]
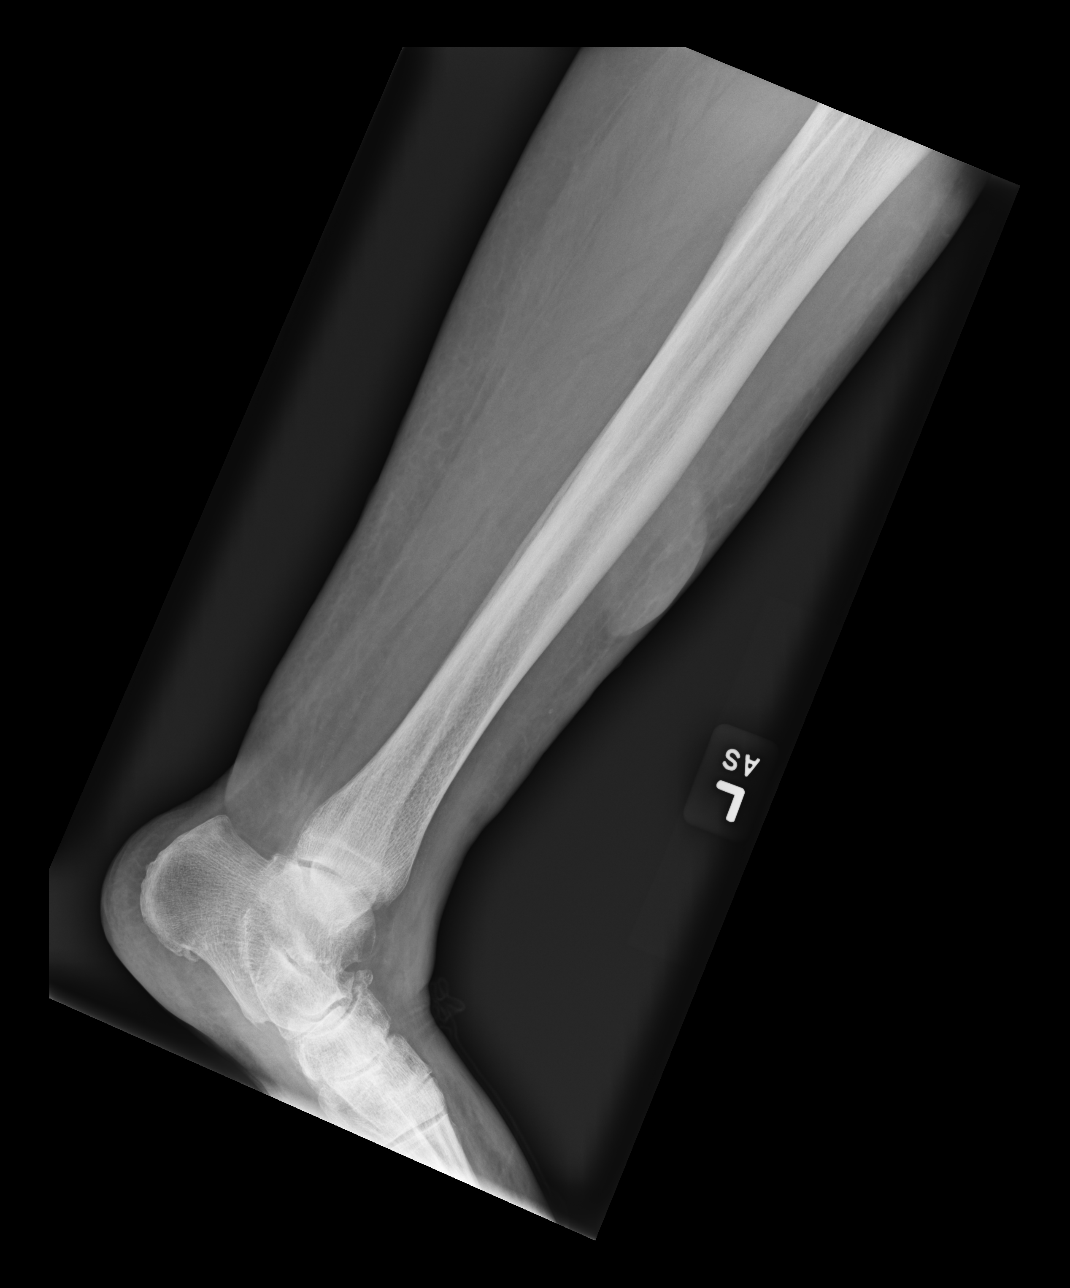

[dg tibia/fibula left (4 of 4)]
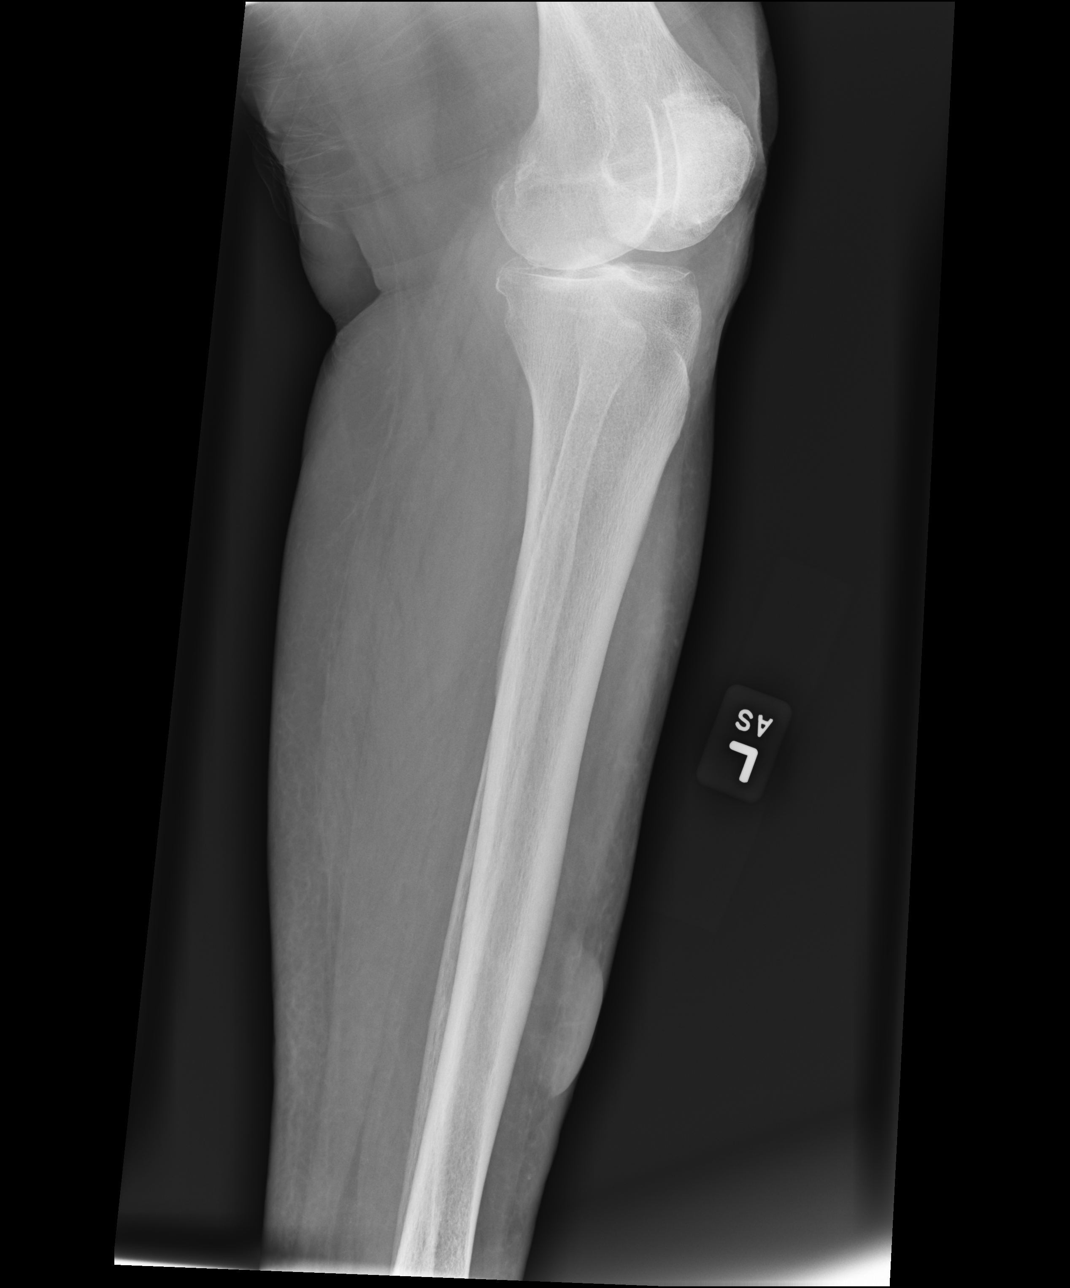

[4 of 4 positions shown; findings below may reference images not displayed]

FINDINGS: Normal alignment with approximation of the joints. No fracture or
focal osseous lesion. Mild knee and ankle degenerative changes.
Lower extremity edema with focal soft tissue swelling overlying the
anterior mid tibial shaft.
IMPRESSION: No acute osseous abnormality.  Mild knee and ankle osteoarthrosis.

Lower extremity edema.  Focal anterior tibial soft tissue swelling.

## 2021-02-03 ENCOUNTER — Ambulatory Visit: Payer: Medicare Other | Admitting: Orthopedic Surgery

## 2021-02-08 ENCOUNTER — Ambulatory Visit (INDEPENDENT_AMBULATORY_CARE_PROVIDER_SITE_OTHER): Payer: Medicare Other | Admitting: Physician Assistant

## 2021-02-08 ENCOUNTER — Encounter: Payer: Self-pay | Admitting: Physician Assistant

## 2021-02-08 DIAGNOSIS — L97929 Non-pressure chronic ulcer of unspecified part of left lower leg with unspecified severity: Secondary | ICD-10-CM | POA: Diagnosis not present

## 2021-02-08 DIAGNOSIS — I83029 Varicose veins of left lower extremity with ulcer of unspecified site: Secondary | ICD-10-CM | POA: Diagnosis not present

## 2021-02-08 DIAGNOSIS — I70234 Atherosclerosis of native arteries of right leg with ulceration of heel and midfoot: Secondary | ICD-10-CM

## 2021-02-08 NOTE — Progress Notes (Signed)
Office Visit Note   Patient: Renee Zuniga           Date of Birth: 1931-04-27           MRN: [id number] Visit Date: 02/08/2021              Requested by: Seward Carol, MD [address] [place],  [place] [address] PCP: Seward Carol, MD  Chief Complaint  Patient presents with  . Left Leg - Follow-up      HPI: Is a pleasant 87 year old woman who is accompanied by her daughter.  She is in follow-up today for her bilateral lower extremity venous insufficiency and traumatic ulcer on her left leg.  She has been wearing vive compression socks during the day.  She says she cannot tolerate them at night.  She is also cut the toes out of most of the socks because she finds they hurt her toes   Assessment & Plan: Visit Diagnoses: No diagnosis found.  Plan: Patient has significantly improved.  Should continue to wear the socks as much as possible.  We will follow-up for recheck in 1 month  Follow-Up Instructions: No follow-ups on file.   Ortho Exam  Patient is alert, oriented, no adenopathy, well-dressed, normal affect, normal respiratory effort. Bilateral lower extremity she does have some skin changes consistent with venous insufficiency.  But no cellulitis.  She has one 3 x 2 eschar over healed ulcer on the left leg.  There is just a scant amount of serous drainage no necrosis no foul odor no surrounding erythema or cellulitis  Imaging: No results found. No images are attached to the encounter.  Labs: Lab Results  Component Value Date   HGBA1C 6.0 (H) 11/10/2014   REPTSTATUS 08/20/2016 FINAL 08/19/2016   CULT MULTIPLE SPECIES PRESENT, SUGGEST RECOLLECTION (A) 08/19/2016     Lab Results  Component Value Date   ALBUMIN 3.6 11/25/2018   ALBUMIN 4.0 08/19/2016   ALBUMIN 4.0 11/09/2014    No results found for: MG No results found for: VD25OH  No results found for: PREALBUMIN CBC EXTENDED Latest Ref Rng & Units 11/26/2018 11/25/2018 08/20/2016  WBC 4.0 -  10.5 K/uL 8.0 9.7 6.4  RBC 3.87 - 5.11 MIL/uL 3.67(L) 3.70(L) 4.05  HGB 12.0 - 15.0 g/dL 11.2(L) 11.5(L) 12.3  HCT 36.0 - 46.0 % 35.7(L) 36.2 38.9  PLT 150 - 400 K/uL 220 204 255  NEUTROABS 1.7 - 7.7 K/uL - 7.5 -  LYMPHSABS 0.7 - 4.0 K/uL - 1.4 -     There is no height or weight on file to calculate BMI.  Orders:  No orders of the defined types were placed in this encounter.  No orders of the defined types were placed in this encounter.    Procedures: No procedures performed  Clinical Data: No additional findings.  ROS:  All other systems negative, except as noted in the HPI. Review of Systems  Objective: Vital Signs: There were no vitals taken for this visit.  Specialty Comments:  No specialty comments available.  PMFS History: Patient Active Problem List   Diagnosis Date Noted  . Pelvis fracture ([place]) 11/26/2018  . Mild neurocognitive disorder, amnestic type 08/22/2017  . Aphasia 08/19/2016  . Localization-related idiopathic epilepsy and epileptic syndromes with seizures of localized onset, not intractable, without status epilepticus ([place]) 08/12/2015  . TIA (transient ischemic attack) 11/09/2014  . HTN (hypertension) 11/09/2014  . Bilateral foot pain 03/31/2014  . Pes planus 03/31/2014  . Subluxation of  ankle joint 03/31/2014  . Arthritis   . Heart murmur   . Mitral valve prolapse   . Osteoarthritis   . Vitamin D deficiency   . Migraine headache   . Hypercholesteremia   . Tinnitus    Past Medical History:  Diagnosis Date  . Arthritis   . Heart murmur   . High blood pressure   . HTN (hypertension) 11/09/2014  . Hypercholesteremia   . Insomnia   . Localization-related idiopathic epilepsy and epileptic syndromes with seizures of localized onset, not intractable, without status epilepticus ([place]) 08/12/2015  . Migraine headache   . Mild neurocognitive disorder, amnestic type 08/22/2017  . Mitral valve prolapse   . Osteoarthritis   . Osteopenia   .  Pelvis fracture ([place]) 11/26/2018  . Seizures ([place])   . TIA (transient ischemic attack) 11/09/2014  . Tinnitus   . Vitamin D deficiency     Family History  Problem Relation Age of Onset  . Mitral valve prolapse Father   . Heart Problems Mother   . Hypertension Mother     Past Surgical History:  Procedure Laterality Date  . CHOLECYSTECTOMY    . CORRECTION HAMMER TOE Bilateral   . GALLBLADDER SURGERY    . OOPHORECTOMY     benign tumor, per patient  . PARTIAL HYSTERECTOMY  1974   due to uterine prolaspe   Social History   Occupational History    Comment: Retired  Tobacco Use  . Smoking status: Never Smoker  . Smokeless tobacco: Never Used  Vaping Use  . Vaping Use: Never used  Substance and Sexual Activity  . Alcohol use: Yes    Alcohol/week: 7.0 standard drinks    Types: 7 Glasses of wine per week    Comment: 1 glass of wine nightly with dinner  . Drug use: No  . Sexual activity: Not Currently

## 2021-02-09 ENCOUNTER — Telehealth: Payer: Self-pay

## 2021-02-09 ENCOUNTER — Ambulatory Visit: Payer: Medicare Other | Admitting: Physician Assistant

## 2021-02-09 NOTE — Telephone Encounter (Signed)
I had called and sw the pt's daughter earlier and advised that the pt should proceed to the ER if she was having the point and discoloration a described below for an u/s to r/o a DVT. Pt's daughter advised that she will not go to the ER and that since taking off her compression socks that the pt states that she is feeling better. That the skin is no more discolored than it was when she was in the office yesterday and that the pt states that since removing compression sock that she is having less pain. I discussed options with the daughter advised that we could see her in the office tomorrow morning, we could send a referral for U/S at an outside facility because the pt refuses to go to the ER or hospital for any treatment ( because people die in the hospital). Chose to come in tomorrow afternoon because it is too difficult to get up in the morning so appt scheduled for tomorrow at 1pm. Advised that if issues get worse increasing pain, discoloration, warmth etc she should proceed to the ER

## 2021-02-09 NOTE — Telephone Encounter (Signed)
Patients daughter called she stated the patient is having a tremendous amount of pain in her left leg she stated her leg is a dark color and is almost purple she stated the patient is in pain she is almost in tears but when she takes them off there is still pain but doesn't hurt as much patients daughter would like a call back:[phone]

## 2021-02-10 ENCOUNTER — Ambulatory Visit (HOSPITAL_COMMUNITY)
Admission: RE | Admit: 2021-02-10 | Discharge: 2021-02-10 | Disposition: A | Discharge status: Home / Self Care | Payer: Medicare Other | Source: Ambulatory Visit | Attending: Physician Assistant | Admitting: Physician Assistant

## 2021-02-10 ENCOUNTER — Telehealth: Payer: Self-pay

## 2021-02-10 ENCOUNTER — Other Ambulatory Visit: Payer: Self-pay

## 2021-02-10 ENCOUNTER — Ambulatory Visit (INDEPENDENT_AMBULATORY_CARE_PROVIDER_SITE_OTHER): Payer: Medicare Other | Admitting: Physician Assistant

## 2021-02-10 ENCOUNTER — Encounter: Payer: Self-pay | Admitting: Orthopedic Surgery

## 2021-02-10 DIAGNOSIS — M79605 Pain in left leg: Secondary | ICD-10-CM

## 2021-02-10 DIAGNOSIS — L97929 Non-pressure chronic ulcer of unspecified part of left lower leg with unspecified severity: Secondary | ICD-10-CM

## 2021-02-10 DIAGNOSIS — I83029 Varicose veins of left lower extremity with ulcer of unspecified site: Secondary | ICD-10-CM

## 2021-02-10 NOTE — Progress Notes (Signed)
Office Visit Note   Patient: Renee Zuniga           Date of Birth: August 11, 1931           MRN: [id number] Visit Date: 02/10/2021              Requested by: Seward Carol, MD [address] [place],  [place] [address] PCP: Seward Carol, MD  Chief Complaint  Patient presents with  . Left Leg - Pain      HPI: The patient presents today with her daughter.  She is status post venous stasis ulcer on her left lower extremity.  While she has had trouble tolerating her compression socks her daughter feels that this is gotten worse.  She says she has particular tenderness over her lateral and posterior calf.  They have also noticed some discoloration with shades of purple.   Assessment & Plan: Visit Diagnoses: No diagnosis found.  Plan: We will have her do an ultrasound to rule out DVT.  If this is ruled out patient is willing to try it one larger size socks which she has at home.  We discussed at first placing these on her legs for short periods of time  Follow-Up Instructions: No follow-ups on file.   Ortho Exam  Patient is alert, oriented, no adenopathy, well-dressed, normal affect, normal respiratory effort. Left lower extremity no cellulitis ulcer is scabbed over and does not have any surrounding erythema.  No foul odor no drainage.  She does have tenderness to the posterior calf.  She does have skin changes consistent with venous insufficiency  Imaging: No results found. No images are attached to the encounter.  Labs: Lab Results  Component Value Date   HGBA1C 6.0 (H) 11/10/2014   REPTSTATUS 08/20/2016 FINAL 08/19/2016   CULT MULTIPLE SPECIES PRESENT, SUGGEST RECOLLECTION (A) 08/19/2016     Lab Results  Component Value Date   ALBUMIN 3.6 11/25/2018   ALBUMIN 4.0 08/19/2016   ALBUMIN 4.0 11/09/2014    No results found for: MG No results found for: VD25OH  No results found for: PREALBUMIN CBC EXTENDED Latest Ref Rng & Units 11/26/2018 11/25/2018  08/20/2016  WBC 4.0 - 10.5 K/uL 8.0 9.7 6.4  RBC 3.87 - 5.11 MIL/uL 3.67(L) 3.70(L) 4.05  HGB 12.0 - 15.0 g/dL 11.2(L) 11.5(L) 12.3  HCT 36.0 - 46.0 % 35.7(L) 36.2 38.9  PLT 150 - 400 K/uL 220 204 255  NEUTROABS 1.7 - 7.7 K/uL - 7.5 -  LYMPHSABS 0.7 - 4.0 K/uL - 1.4 -     There is no height or weight on file to calculate BMI.  Orders:  No orders of the defined types were placed in this encounter.  No orders of the defined types were placed in this encounter.    Procedures: No procedures performed  Clinical Data: No additional findings.  ROS:  All other systems negative, except as noted in the HPI. Review of Systems  Objective: Vital Signs: There were no vitals taken for this visit.  Specialty Comments:  No specialty comments available.  PMFS History: Patient Active Problem List   Diagnosis Date Noted  . Pelvis fracture ([place]) 11/26/2018  . Mild neurocognitive disorder, amnestic type 08/22/2017  . Aphasia 08/19/2016  . Localization-related idiopathic epilepsy and epileptic syndromes with seizures of localized onset, not intractable, without status epilepticus ([place]) 08/12/2015  . TIA (transient ischemic attack) 11/09/2014  . HTN (hypertension) 11/09/2014  . Bilateral foot pain 03/31/2014  . Pes planus 03/31/2014  .  Subluxation of ankle joint 03/31/2014  . Arthritis   . Heart murmur   . Mitral valve prolapse   . Osteoarthritis   . Vitamin D deficiency   . Migraine headache   . Hypercholesteremia   . Tinnitus    Past Medical History:  Diagnosis Date  . Arthritis   . Heart murmur   . High blood pressure   . HTN (hypertension) 11/09/2014  . Hypercholesteremia   . Insomnia   . Localization-related idiopathic epilepsy and epileptic syndromes with seizures of localized onset, not intractable, without status epilepticus ([place]) 08/12/2015  . Migraine headache   . Mild neurocognitive disorder, amnestic type 08/22/2017  . Mitral valve prolapse   . Osteoarthritis   .  Osteopenia   . Pelvis fracture ([name]) 11/26/2018  . Seizures ([place])   . TIA (transient ischemic attack) 11/09/2014  . Tinnitus   . Vitamin D deficiency     Family History  Problem Relation Age of Onset  . Mitral valve prolapse Father   . Heart Problems Mother   . Hypertension Mother     Past Surgical History:  Procedure Laterality Date  . CHOLECYSTECTOMY    . CORRECTION HAMMER TOE Bilateral   . GALLBLADDER SURGERY    . OOPHORECTOMY     benign tumor, per patient  . PARTIAL HYSTERECTOMY  1974   due to uterine prolaspe   Social History   Occupational History    Comment: Retired  Tobacco Use  . Smoking status: Never Smoker  . Smokeless tobacco: Never Used  Vaping Use  . Vaping Use: Never used  Substance and Sexual Activity  . Alcohol use: Yes    Alcohol/week: 7.0 standard drinks    Types: 7 Glasses of wine per week    Comment: 1 glass of wine nightly with dinner  . Drug use: No  . Sexual activity: Not Currently

## 2021-02-10 NOTE — Telephone Encounter (Signed)
Salem Senate with [facility] wanted to let Bevely Palmer know that patient is Negative for DVT Left LE.  Please advise.  Thank you.

## 2021-02-10 NOTE — Progress Notes (Signed)
Left lower extremity venous duplex has been completed. Preliminary results can be found in CV Proc through chart review.  Results were given to April at [place] office.  02/10/21 2:17 PM Carlos Levering RVT

## 2021-02-10 NOTE — Telephone Encounter (Signed)
Please see below.

## 2021-02-14 ENCOUNTER — Ambulatory Visit (INDEPENDENT_AMBULATORY_CARE_PROVIDER_SITE_OTHER): Payer: Medicare Other | Admitting: Podiatry

## 2021-02-14 ENCOUNTER — Encounter: Payer: Self-pay | Admitting: Podiatry

## 2021-02-14 ENCOUNTER — Other Ambulatory Visit: Payer: Self-pay

## 2021-02-14 DIAGNOSIS — M2142 Flat foot [pes planus] (acquired), left foot: Secondary | ICD-10-CM

## 2021-02-14 DIAGNOSIS — B351 Tinea unguium: Secondary | ICD-10-CM

## 2021-02-14 DIAGNOSIS — M2141 Flat foot [pes planus] (acquired), right foot: Secondary | ICD-10-CM | POA: Diagnosis not present

## 2021-02-14 DIAGNOSIS — I70234 Atherosclerosis of native arteries of right leg with ulceration of heel and midfoot: Secondary | ICD-10-CM | POA: Diagnosis not present

## 2021-02-16 NOTE — Progress Notes (Signed)
Subjective:   Patient ID: Renee Zuniga, female   DOB: 87 y.o.   MRN: [id number]   HPI Patient presents with caregiver with nail disease lesions and severe foot structural depression left with history of orthotics that have worn out.  Patient does not smoke and is not active   Review of Systems  All other systems reviewed and are negative.       Objective:  Physical Exam Vitals and nursing note reviewed.  Constitutional:      Appearance: She is well-developed and well-nourished.  Cardiovascular:     Pulses: Intact distal pulses.  Pulmonary:     Effort: Pulmonary effort is normal.  Musculoskeletal:        General: Normal range of motion.  Skin:    General: Skin is warm.  Neurological:     Mental Status: She is alert.     Neurovascular status was found to be diminished with sharp dull vibratory diminished with significant flatfoot deformity left over right stress on the posterior tibial tendon.  Patient has nail disease 1-5 both feet nonpainful but she cannot cut herself with lesion formation also noted     Assessment:  Chronic structural arch changes with nail disease patient having moderate dementia and lesions      Plan:  H&P reviewed all conditions discussed this with her and caregiver and at this point casted for functional orthotics debrided nailbeds and lesions and encouraged her to come in if any breakdown of tissue were to occur.  I reviewed and answered all questions to the best of ability  X-rays indicate that the patient does have collapse medial longitudinal arch left over right with stress against the talonavicular joint

## 2021-02-22 DIAGNOSIS — R310 Gross hematuria: Secondary | ICD-10-CM | POA: Diagnosis not present

## 2021-02-22 DIAGNOSIS — D649 Anemia, unspecified: Secondary | ICD-10-CM | POA: Diagnosis not present

## 2021-02-25 ENCOUNTER — Emergency Department (HOSPITAL_COMMUNITY): Payer: Medicare Other

## 2021-02-25 ENCOUNTER — Telehealth: Payer: Self-pay | Admitting: Neurology

## 2021-02-25 ENCOUNTER — Other Ambulatory Visit: Payer: Self-pay

## 2021-02-25 ENCOUNTER — Inpatient Hospital Stay (HOSPITAL_COMMUNITY)
Admission: EM | Admit: 2021-02-25 | Discharge: 2021-03-03 | DRG: 100 | Disposition: A | Discharge status: Skilled Nursing Facility | Payer: Medicare Other | Attending: Internal Medicine | Admitting: Internal Medicine

## 2021-02-25 ENCOUNTER — Encounter (HOSPITAL_COMMUNITY): Payer: Self-pay | Admitting: Radiology

## 2021-02-25 DIAGNOSIS — Z9049 Acquired absence of other specified parts of digestive tract: Secondary | ICD-10-CM

## 2021-02-25 DIAGNOSIS — M19011 Primary osteoarthritis, right shoulder: Secondary | ICD-10-CM | POA: Diagnosis present

## 2021-02-25 DIAGNOSIS — G9349 Other encephalopathy: Secondary | ICD-10-CM | POA: Diagnosis present

## 2021-02-25 DIAGNOSIS — R569 Unspecified convulsions: Secondary | ICD-10-CM

## 2021-02-25 DIAGNOSIS — E785 Hyperlipidemia, unspecified: Secondary | ICD-10-CM | POA: Diagnosis present

## 2021-02-25 DIAGNOSIS — F0281 Dementia in other diseases classified elsewhere with behavioral disturbance: Secondary | ICD-10-CM | POA: Diagnosis not present

## 2021-02-25 DIAGNOSIS — J690 Pneumonitis due to inhalation of food and vomit: Secondary | ICD-10-CM | POA: Diagnosis not present

## 2021-02-25 DIAGNOSIS — R509 Fever, unspecified: Secondary | ICD-10-CM

## 2021-02-25 DIAGNOSIS — Z20822 Contact with and (suspected) exposure to covid-19: Secondary | ICD-10-CM | POA: Diagnosis present

## 2021-02-25 DIAGNOSIS — Z8249 Family history of ischemic heart disease and other diseases of the circulatory system: Secondary | ICD-10-CM

## 2021-02-25 DIAGNOSIS — M25511 Pain in right shoulder: Secondary | ICD-10-CM | POA: Diagnosis not present

## 2021-02-25 DIAGNOSIS — Z66 Do not resuscitate: Secondary | ICD-10-CM | POA: Diagnosis present

## 2021-02-25 DIAGNOSIS — I639 Cerebral infarction, unspecified: Secondary | ICD-10-CM

## 2021-02-25 DIAGNOSIS — Z79899 Other long term (current) drug therapy: Secondary | ICD-10-CM

## 2021-02-25 DIAGNOSIS — R197 Diarrhea, unspecified: Secondary | ICD-10-CM | POA: Diagnosis not present

## 2021-02-25 DIAGNOSIS — E7800 Pure hypercholesterolemia, unspecified: Secondary | ICD-10-CM | POA: Diagnosis present

## 2021-02-25 DIAGNOSIS — B9689 Other specified bacterial agents as the cause of diseases classified elsewhere: Secondary | ICD-10-CM | POA: Diagnosis present

## 2021-02-25 DIAGNOSIS — Z8673 Personal history of transient ischemic attack (TIA), and cerebral infarction without residual deficits: Secondary | ICD-10-CM

## 2021-02-25 DIAGNOSIS — I63233 Cerebral infarction due to unspecified occlusion or stenosis of bilateral carotid arteries: Secondary | ICD-10-CM | POA: Diagnosis not present

## 2021-02-25 DIAGNOSIS — R29810 Facial weakness: Secondary | ICD-10-CM | POA: Diagnosis not present

## 2021-02-25 DIAGNOSIS — I341 Nonrheumatic mitral (valve) prolapse: Secondary | ICD-10-CM | POA: Diagnosis present

## 2021-02-25 DIAGNOSIS — R29818 Other symptoms and signs involving the nervous system: Secondary | ICD-10-CM | POA: Diagnosis not present

## 2021-02-25 DIAGNOSIS — Z885 Allergy status to narcotic agent status: Secondary | ICD-10-CM

## 2021-02-25 DIAGNOSIS — G40009 Localization-related (focal) (partial) idiopathic epilepsy and epileptic syndromes with seizures of localized onset, not intractable, without status epilepticus: Secondary | ICD-10-CM | POA: Diagnosis not present

## 2021-02-25 DIAGNOSIS — R531 Weakness: Secondary | ICD-10-CM | POA: Diagnosis not present

## 2021-02-25 DIAGNOSIS — D649 Anemia, unspecified: Secondary | ICD-10-CM | POA: Diagnosis present

## 2021-02-25 DIAGNOSIS — B9620 Unspecified Escherichia coli [E. coli] as the cause of diseases classified elsewhere: Secondary | ICD-10-CM | POA: Diagnosis present

## 2021-02-25 DIAGNOSIS — I10 Essential (primary) hypertension: Secondary | ICD-10-CM | POA: Diagnosis not present

## 2021-02-25 DIAGNOSIS — I6782 Cerebral ischemia: Secondary | ICD-10-CM | POA: Diagnosis not present

## 2021-02-25 DIAGNOSIS — G3184 Mild cognitive impairment, so stated: Secondary | ICD-10-CM | POA: Diagnosis present

## 2021-02-25 DIAGNOSIS — Z79890 Hormone replacement therapy (postmenopausal): Secondary | ICD-10-CM

## 2021-02-25 DIAGNOSIS — Z90711 Acquired absence of uterus with remaining cervical stump: Secondary | ICD-10-CM

## 2021-02-25 DIAGNOSIS — Z888 Allergy status to other drugs, medicaments and biological substances status: Secondary | ICD-10-CM

## 2021-02-25 DIAGNOSIS — N390 Urinary tract infection, site not specified: Secondary | ICD-10-CM | POA: Diagnosis present

## 2021-02-25 DIAGNOSIS — B952 Enterococcus as the cause of diseases classified elsewhere: Secondary | ICD-10-CM | POA: Diagnosis present

## 2021-02-25 DIAGNOSIS — G40909 Epilepsy, unspecified, not intractable, without status epilepticus: Secondary | ICD-10-CM

## 2021-02-25 DIAGNOSIS — E039 Hypothyroidism, unspecified: Secondary | ICD-10-CM

## 2021-02-25 LAB — URINALYSIS, ROUTINE W REFLEX MICROSCOPIC
Bilirubin Urine: NEGATIVE
Glucose, UA: NEGATIVE mg/dL
Ketones, ur: 5 mg/dL — AB
Nitrite: NEGATIVE
Protein, ur: NEGATIVE mg/dL
Specific Gravity, Urine: 1.012 (ref 1.005–1.030)
pH: 8 (ref 5.0–8.0)

## 2021-02-25 LAB — RAPID URINE DRUG SCREEN, HOSP PERFORMED
Amphetamines: NOT DETECTED
Barbiturates: NOT DETECTED
Benzodiazepines: NOT DETECTED
Cocaine: NOT DETECTED
Opiates: NOT DETECTED
Tetrahydrocannabinol: NOT DETECTED

## 2021-02-25 LAB — CBC
HCT: 40.5 % (ref 36.0–46.0)
Hemoglobin: 12.7 g/dL (ref 12.0–15.0)
MCH: 30.8 pg (ref 26.0–34.0)
MCHC: 31.4 g/dL (ref 30.0–36.0)
MCV: 98.3 fL (ref 80.0–100.0)
Platelets: 284 10*3/uL (ref 150–400)
RBC: 4.12 MIL/uL (ref 3.87–5.11)
RDW: 14.2 % (ref 11.5–15.5)
WBC: 7.1 10*3/uL (ref 4.0–10.5)
nRBC: 0 % (ref 0.0–0.2)

## 2021-02-25 LAB — I-STAT CHEM 8, ED
BUN: 15 mg/dL (ref 8–23)
Calcium, Ion: 1.14 mmol/L — ABNORMAL LOW (ref 1.15–1.40)
Chloride: 106 mmol/L (ref 98–111)
Creatinine, Ser: 0.7 mg/dL (ref 0.44–1.00)
Glucose, Bld: 110 mg/dL — ABNORMAL HIGH (ref 70–99)
HCT: 37 % (ref 36.0–46.0)
Hemoglobin: 12.6 g/dL (ref 12.0–15.0)
Potassium: 4 mmol/L (ref 3.5–5.1)
Sodium: 139 mmol/L (ref 135–145)
TCO2: 26 mmol/L (ref 22–32)

## 2021-02-25 LAB — RESP PANEL BY RT-PCR (FLU A&B, COVID) ARPGX2
Influenza A by PCR: NEGATIVE
Influenza B by PCR: NEGATIVE
SARS Coronavirus 2 by RT PCR: NEGATIVE

## 2021-02-25 LAB — DIFFERENTIAL
Abs Immature Granulocytes: 0.03 10*3/uL (ref 0.00–0.07)
Basophils Absolute: 0.1 10*3/uL (ref 0.0–0.1)
Basophils Relative: 1 %
Eosinophils Absolute: 0.1 10*3/uL (ref 0.0–0.5)
Eosinophils Relative: 1 %
Immature Granulocytes: 0 %
Lymphocytes Relative: 20 %
Lymphs Abs: 1.4 10*3/uL (ref 0.7–4.0)
Monocytes Absolute: 0.8 10*3/uL (ref 0.1–1.0)
Monocytes Relative: 11 %
Neutro Abs: 4.7 10*3/uL (ref 1.7–7.7)
Neutrophils Relative %: 67 %

## 2021-02-25 LAB — ETHANOL: Alcohol, Ethyl (B): <10 mg/dL (ref <10)

## 2021-02-25 LAB — COMPREHENSIVE METABOLIC PANEL
ALT: 23 U/L (ref 0–44)
AST: 27 U/L (ref 15–41)
Albumin: 4.1 g/dL (ref 3.5–5.0)
Alkaline Phosphatase: 58 U/L (ref 38–126)
Anion gap: 9 (ref 5–15)
BUN: 14 mg/dL (ref 8–23)
CO2: 22 mmol/L (ref 22–32)
Calcium: 9.1 mg/dL (ref 8.9–10.3)
Chloride: 107 mmol/L (ref 98–111)
Creatinine, Ser: 0.79 mg/dL (ref 0.44–1.00)
GFR, Estimated: >60 mL/min (ref >60)
Glucose, Bld: 112 mg/dL — ABNORMAL HIGH (ref 70–99)
Potassium: 3.9 mmol/L (ref 3.5–5.1)
Sodium: 138 mmol/L (ref 135–145)
Total Bilirubin: 1.1 mg/dL (ref 0.3–1.2)
Total Protein: 6.9 g/dL (ref 6.5–8.1)

## 2021-02-25 LAB — APTT: aPTT: 30 seconds (ref 24–36)

## 2021-02-25 LAB — PROTIME-INR
INR: 1.1 (ref 0.8–1.2)
Prothrombin Time: 13.4 seconds (ref 11.4–15.2)

## 2021-02-25 MED ORDER — SODIUM CHLORIDE 0.9 % IV SOLN: 1.0000 g | INTRAVENOUS | Status: DC

## 2021-02-25 MED ORDER — ENOXAPARIN SODIUM 40 MG/0.4ML ~~LOC~~ SOLN
40.0000 mg | SUBCUTANEOUS | Status: DC
  Administered 2021-02-26 – 2021-03-03 (×6): 40 mg via SUBCUTANEOUS
  Filled 2021-02-25 (×6): qty 0.4

## 2021-02-25 MED ORDER — LORAZEPAM 2 MG/ML IJ SOLN
INTRAMUSCULAR | Status: AC
  Filled 2021-02-25: qty 1

## 2021-02-25 MED ORDER — IOHEXOL 350 MG/ML SOLN
80.0000 mL | Freq: Once | INTRAVENOUS | Status: AC | PRN
  Administered 2021-02-25: 80 mL via INTRAVENOUS

## 2021-02-25 MED ORDER — LEVETIRACETAM IN NACL 1500 MG/100ML IV SOLN
1500.0000 mg | Freq: Two times a day (BID) | INTRAVENOUS | Status: DC
  Administered 2021-02-26 – 2021-03-03 (×11): 1500 mg via INTRAVENOUS
  Filled 2021-02-25 (×12): qty 100

## 2021-02-25 MED ORDER — SODIUM CHLORIDE 0.9 % IV SOLN
2000.0000 mg | Freq: Once | INTRAVENOUS | Status: AC
  Administered 2021-02-25: 2000 mg via INTRAVENOUS
  Filled 2021-02-25: qty 20

## 2021-02-25 MED ORDER — LORAZEPAM 2 MG/ML IJ SOLN
0.5000 mg | Freq: Once | INTRAMUSCULAR | Status: AC
  Administered 2021-02-25: 0.5 mg via INTRAVENOUS

## 2021-02-25 MED ORDER — LORAZEPAM 2 MG/ML IJ SOLN
1.0000 mg | Freq: Once | INTRAMUSCULAR | Status: AC
  Administered 2021-02-25: 1 mg via INTRAVENOUS
  Filled 2021-02-25: qty 1

## 2021-02-25 MED ORDER — DIAZEPAM 5 MG/ML IJ SOLN
5.0000 mg | Freq: Once | INTRAMUSCULAR | Status: AC
  Administered 2021-02-25: 5 mg via INTRAVENOUS
  Filled 2021-02-25: qty 2

## 2021-02-25 MED ORDER — ACETAMINOPHEN 325 MG PO TABS: 650.0000 mg | ORAL_TABLET | ORAL | Status: DC | PRN

## 2021-02-25 MED ORDER — SODIUM CHLORIDE 0.9 % IV SOLN
1.0000 g | Freq: Once | INTRAVENOUS | Status: AC
  Administered 2021-02-25: 1 g via INTRAVENOUS
  Filled 2021-02-25: qty 10

## 2021-02-25 MED ORDER — ACETAMINOPHEN 650 MG RE SUPP
650.0000 mg | RECTAL | Status: DC | PRN
  Administered 2021-02-26: 650 mg via RECTAL
  Filled 2021-02-25: qty 1

## 2021-02-25 NOTE — ED Provider Notes (Signed)
[facility] EMERGENCY DEPARTMENT Provider Note   CSN: [id number] Arrival date & time: 02/25/21  1217     History Chief Complaint  Patient presents with  . Seizures    Renee Zuniga is a 87 y.o. female.  Pt's family reports pt was normal last pm.  They report they found pt at 10 am with urinary incontinence and confusion.  Pt was given valium at home.  Family concerned because pt was not talking. Pt does have a histroy of dementia.   The history is provided by the EMS personnel and a relative. No language interpreter was used.  Seizures Seizure activity on arrival: no   Seizure type:  Unable to specify Episode characteristics: partial responsiveness   Recent head injury:  No recent head injuries PTA treatment:  Diazepam      Past Medical History:  Diagnosis Date  . Arthritis   . Heart murmur   . High blood pressure   . HTN (hypertension) 11/09/2014  . Hypercholesteremia   . Insomnia   . Localization-related idiopathic epilepsy and epileptic syndromes with seizures of localized onset, not intractable, without status epilepticus ([place]) 08/12/2015  . Migraine headache   . Mild neurocognitive disorder, amnestic type 08/22/2017  . Mitral valve prolapse   . Osteoarthritis   . Osteopenia   . Pelvis fracture ([place]) 11/26/2018  . Seizures ([place])   . TIA (transient ischemic attack) 11/09/2014  . Tinnitus   . Vitamin D deficiency     Patient Active Problem List   Diagnosis Date Noted  . Pelvis fracture ([place]) 11/26/2018  . Mild neurocognitive disorder, amnestic type 08/22/2017  . Aphasia 08/19/2016  . Localization-related idiopathic epilepsy and epileptic syndromes with seizures of localized onset, not intractable, without status epilepticus ([place]) 08/12/2015  . TIA (transient ischemic attack) 11/09/2014  . HTN (hypertension) 11/09/2014  . Bilateral foot pain 03/31/2014  . Pes planus 03/31/2014  . Subluxation of ankle joint 03/31/2014  . Arthritis   . Heart  murmur   . Mitral valve prolapse   . Osteoarthritis   . Vitamin D deficiency   . Migraine headache   . Hypercholesteremia   . Tinnitus     Past Surgical History:  Procedure Laterality Date  . CHOLECYSTECTOMY    . CORRECTION HAMMER TOE Bilateral   . GALLBLADDER SURGERY    . OOPHORECTOMY     benign tumor, per patient  . PARTIAL HYSTERECTOMY  1974   due to uterine prolaspe     OB History   No obstetric history on file.     Family History  Problem Relation Age of Onset  . Mitral valve prolapse Father   . Heart Problems Mother   . Hypertension Mother     Social History   Tobacco Use  . Smoking status: Never Smoker  . Smokeless tobacco: Never Used  Vaping Use  . Vaping Use: Never used  Substance Use Topics  . Alcohol use: Yes    Alcohol/week: 7.0 standard drinks    Types: 7 Glasses of wine per week    Comment: 1 glass of wine nightly with dinner  . Drug use: No    Home Medications Prior to Admission medications   Medication Sig Start Date End Date Taking? Authorizing Provider  acetaminophen (TYLENOL) 325 MG tablet Take 2 tablets (650 mg total) by mouth every 6 (six) hours as needed for mild pain (or Fever >/= 101). 11/29/18   Elgergawy, Silver Huguenin, MD  Calcium Carb-Cholecalciferol (OYSTER SHELL CALCIUM/VITAMIN D)  500-200 MG-UNIT TABS Take 1 tablet by mouth 2 (two) times daily. Patient not taking: Reported on 09/15/2020    [provider]  cyclobenzaprine (FLEXERIL) 5 MG tablet Take 1 tablet (5 mg total) by mouth 3 (three) times daily as needed for muscle spasms. 11/29/18   Elgergawy, Silver Huguenin, MD  diclofenac sodium (VOLTAREN) 1 % GEL Apply 2 g topically daily. For knee pain    [provider]  escitalopram (LEXAPRO) 10 MG tablet Take 1 tablet (10 mg total) by mouth daily. 09/15/20   Cameron Sprang, MD  ezetimibe (ZETIA) 10 MG tablet Take 10 mg by mouth at bedtime.     [provider]  furosemide (LASIX) 20 MG tablet Take 20 mg by mouth as  needed.  08/21/20   [provider]  levETIRAcetam (KEPPRA) 750 MG tablet Take 2 tablets twice a day 09/15/20   Cameron Sprang, MD  levothyroxine (SYNTHROID, LEVOTHROID) 50 MCG tablet Take 50 mcg by mouth daily before breakfast.    [provider]  lisinopril (PRINIVIL,ZESTRIL) 10 MG tablet Take 10 mg by mouth daily.    [provider]  LORazepam (ATIVAN) 1 MG tablet TAKE 1 TABLET AS NEEDED FOR SEIZURE. DO NOT TAKE MORE THAN 2 IN 24 HRS. 09/15/20   Cameron Sprang, MD  Multiple Vitamins-Minerals (MULTIVITAMIN WITH MINERALS) tablet Take 1 tablet by mouth daily.    [provider]  pentoxifylline (TRENTAL) 400 MG CR tablet Take 1 tablet (400 mg total) by mouth 3 (three) times daily with meals. 12/30/20   Newt Minion, MD  rivastigmine (EXELON) 1.5 MG capsule TAKE (1) CAPSULE TWICE DAILY. 12/27/20   Cameron Sprang, MD    Allergies    Ambien [zolpidem], Demerol [meperidine], Lunesta [eszopiclone], and Tramadol  Review of Systems   Review of Systems  Unable to perform ROS: Mental status change  Neurological: Positive for seizures.  All other systems reviewed and are negative.   Physical Exam Updated Vital Signs BP (!) 152/75   Pulse 87   Temp 98.1 F (36.7 C) (Oral)   Resp 18   Ht 5\' 5"  (1.651 m)   Wt 72.6 kg   SpO2 98%   BMI 26.63 kg/m   Physical Exam Vitals and nursing note reviewed.  Constitutional:      Appearance: She is well-developed.  HENT:     Head: Normocephalic.     Right Ear: Tympanic membrane normal.     Left Ear: Tympanic membrane normal.     Nose: Nose normal.     Mouth/Throat:     Mouth: Mucous membranes are moist.  Eyes:     Pupils: Pupils are equal, round, and reactive to light.  Cardiovascular:     Rate and Rhythm: Normal rate.  Pulmonary:     Effort: Pulmonary effort is normal.  Abdominal:     General: Abdomen is flat. There is no distension.  Musculoskeletal:        General: Tenderness present. Normal range of  motion.     Cervical back: Normal range of motion.     Comments: Pt moves all extremities, tender right shoulder, slight erythema   Skin:    General: Skin is warm.  Neurological:     Mental Status: She is alert. She is disoriented.  Psychiatric:        Mood and Affect: Mood normal.     ED Results / Procedures / Treatments   Labs (all labs ordered are listed, but only abnormal  results are displayed) Labs Reviewed  I-STAT CHEM 8, ED - Abnormal; Notable for the following components:      Result Value   Glucose, Bld 110 (*)    Calcium, Ion 1.14 (*)    All other components within normal limits  RESP PANEL BY RT-PCR (FLU A&B, COVID) ARPGX2  ETHANOL  PROTIME-INR  APTT  CBC  DIFFERENTIAL  COMPREHENSIVE METABOLIC PANEL  RAPID URINE DRUG SCREEN, HOSP PERFORMED  URINALYSIS, ROUTINE W REFLEX MICROSCOPIC    EKG EKG Interpretation  Date/Time:  Friday February 25 2021 12:21:16 EDT Ventricular Rate:  96 PR Interval:    QRS Duration: 92 QT Interval:  385 QTC Calculation: 487 R Axis:   46 Text Interpretation: Sinus rhythm Borderline prolonged QT interval No significant change since last tracing Confirmed by Dorie Rank [id number]) on 02/25/2021 12:29:54 PM   Radiology No results found.  Procedures Procedures   Medications Ordered in ED Medications - No data to display  ED Course  I have reviewed the triage vital signs and the nursing notes.  Pertinent labs & imaging results that were available during my care of the patient were reviewed by me and considered in my medical decision making (see chart for details).    MDM Rules/Calculators/A&P                          MDM: I spoke to Dr. Quinn [facility] who will see pt here for evaluation. Pt to be admitted Final Clinical Impression(s) / ED Diagnoses Final diagnoses:  Cerebrovascular accident (CVA), unspecified mechanism [facility])    Rx / [place] Orders ED Discharge Orders    None       Sidney Ace 02/25/21 1519     Dorie Rank, MD 02/26/21 1321

## 2021-02-25 NOTE — Telephone Encounter (Signed)
Line busy at [facility]

## 2021-02-25 NOTE — ED Triage Notes (Signed)
Pt BIB [facility] from home. Pt lives with children. Pt went to bed around 2200 hrs last night completely normal. Family believes at some point throughout the night the patient had a seizure because when they found her at 1000 this AM pt was found to be incontinent with room in [place]. Pt does have a hx of seizures but per EMS there was no hx of patient taking a daily med. Family did administer home PO Ativan when they found patient at 54 but pt was not currently seizing. Ever since patient was found she has been reported to be aphasic, only nodding to yes or no questions however during triage patient has been able to intermittently verbalize responses when asking questions. Pt also tearful and reports of R shoulder pain. No mouth trauma or other injuries visualized. VSS per EMS.

## 2021-02-25 NOTE — ED Provider Notes (Signed)
Patient care taken over at handoff.  Patient here with symptoms concerning for stroke.  CT scan with evidence for loss of gray-white differentiation concerning for acute infarct.  Awaiting neurology final recommendations to decide where patient will be admitted to.  Physical Exam  BP (!) 147/91   Pulse 98   Temp 98.1 F (36.7 C) (Oral)   Resp 15   Ht 5\' 5"  (1.651 m)   Wt 72.6 kg   SpO2 95%   BMI 26.63 kg/m   Physical Exam Constitutional:      General: She is not in acute distress.    Appearance: Normal appearance. She is normal weight.  HENT:     Head: Normocephalic and atraumatic.     Right Ear: External ear normal.     Left Ear: External ear normal.     Nose: Nose normal.     Mouth/Throat:     Mouth: Mucous membranes are moist.     Pharynx: Oropharynx is clear.  Eyes:     General:        Right eye: No discharge.        Left eye: No discharge.  Cardiovascular:     Rate and Rhythm: Normal rate.     Heart sounds: Normal heart sounds.  Pulmonary:     Effort: Pulmonary effort is normal. No respiratory distress.  Abdominal:     General: Abdomen is flat.     Palpations: Abdomen is soft.  Musculoskeletal:     Cervical back: Neck supple.     Right lower leg: No edema.     Left lower leg: No edema.  Skin:    General: Skin is warm.     Capillary Refill: Capillary refill takes less than 2 seconds.  Neurological:     Mental Status: She is disoriented.    ED Course/Procedures     Procedures  MDM   It was found that CT scanner that was done to perform patient's original CT head has had consistent artifact on the CT scans performed and that CT scanner today.  CTA head and neck ordered to evaluate patient's symptoms further.  CTA showed no definite acute intracranial abnormality.  No large vessel acute occlusion.  Given patient's continued encephalopathy and altered mental status.  Patient will be admitted for further work-up of her presenting symptoms.  Stroke and  breakthrough seizure work-up.  Continue encephalopathy work-up.  Patient admitted to the hospitalist service.  Stable for admit   Kugler, [place], MD 02/25/21 2315    Drenda Freeze, MD 02/27/21 [phone]

## 2021-02-25 NOTE — ED Notes (Signed)
The pt is almost in constant motion   Pulling at her clothes pulling off her electrodes fighting at her daughter  She has to be held to get her iv meds placed

## 2021-02-25 NOTE — ED Notes (Signed)
Pt brought back from c-t  Staff there could not get the scan finished

## 2021-02-25 NOTE — Consult Note (Signed)
Neurology Consultation  Reason for Consult: family concerned patient had witnessed and unwitnessed seizures  Referring Physician: Shirlyn Goltz, MD  CC: seizures History is obtained from: Chart and Family in room  HPI: Renee Zuniga is a 87 y.o. female w/ PMH of seizure, hypothyroidism, dementia, heart murmur, HTN, HLD, migraine, MVP, OA, pelvis fracture, tinnitus, and TIA. Family reports that patient has hx of seizures since 2013 and they are usually characterized by not being able to speak properly and having trouble recognizing familiar things. This AM family found her with urinary and fecal incontinence and was trying to clean the bed and had stripped down to her bra. Per daughter patient will have diarrhea after she had [facility], which the patient had the night prior to this current episode. The patient was having a hard time recognizing her family. Family noted that they had seen her right limb shaking and believe that the patient was having seizures after she got out of the shower and they called 911. Daughter was in the room with the patient and reported that patient continues to have urinary incontinence. On exam the patient continues to not recognize her daughter in the room.  Patient is very agitated and minimally redirectable. Patient is not able to fully participate in exam. Patient did appear to have significant pain in R shoulder    LKW: 8pm 02/24/2021 tpa given?: no, not indicated  ROS: A 14 point ROS was performed and is negative except as noted in the HPI.   Past Medical History:  Diagnosis Date  . Arthritis   . Heart murmur   . High blood pressure   . HTN (hypertension) 11/09/2014  . Hypercholesteremia   . Insomnia   . Localization-related idiopathic epilepsy and epileptic syndromes with seizures of localized onset, not intractable, without status epilepticus ([place]) 08/12/2015  . Migraine headache   . Mild neurocognitive disorder, amnestic type 08/22/2017  . Mitral valve prolapse    . Osteoarthritis   . Osteopenia   . Pelvis fracture ([place]) 11/26/2018  . Seizures ([place])   . TIA (transient ischemic attack) 11/09/2014  . Tinnitus   . Vitamin D deficiency      Family History  Problem Relation Age of Onset  . Mitral valve prolapse Father   . Heart Problems Mother   . Hypertension Mother      Social History:   reports that she has never smoked. She has never used smokeless tobacco. She reports current alcohol use of about 7.0 standard drinks of alcohol per week. She reports that she does not use drugs.  Medications No current facility-administered medications for this encounter.  Current Outpatient Medications:  .  acetaminophen (TYLENOL) 325 MG tablet, Take 2 tablets (650 mg total) by mouth every 6 (six) hours as needed for mild pain (or Fever >/= 101)., Disp: , Rfl:  .  Calcium Carb-Cholecalciferol (OYSTER SHELL CALCIUM/VITAMIN D) 500-200 MG-UNIT TABS, Take 1 tablet by mouth 2 (two) times daily. (Patient not taking: Reported on 09/15/2020), Disp: , Rfl:  .  cyclobenzaprine (FLEXERIL) 5 MG tablet, Take 1 tablet (5 mg total) by mouth 3 (three) times daily as needed for muscle spasms., Disp: 20 tablet, Rfl: 0 .  diclofenac sodium (VOLTAREN) 1 % GEL, Apply 2 g topically daily. For knee pain, Disp: , Rfl:  .  escitalopram (LEXAPRO) 10 MG tablet, Take 1 tablet (10 mg total) by mouth daily., Disp: 30 tablet, Rfl: 11 .  ezetimibe (ZETIA) 10 MG tablet, Take 10 mg by mouth at  bedtime. , Disp: , Rfl:  .  furosemide (LASIX) 20 MG tablet, Take 20 mg by mouth as needed. , Disp: , Rfl:  .  levETIRAcetam (KEPPRA) 750 MG tablet, Take 2 tablets twice a day, Disp: 120 tablet, Rfl: 11 .  levothyroxine (SYNTHROID, LEVOTHROID) 50 MCG tablet, Take 50 mcg by mouth daily before breakfast., Disp: , Rfl:  .  lisinopril (PRINIVIL,ZESTRIL) 10 MG tablet, Take 10 mg by mouth daily., Disp: , Rfl:  .  LORazepam (ATIVAN) 1 MG tablet, TAKE 1 TABLET AS NEEDED FOR SEIZURE. DO NOT TAKE MORE THAN 2 IN  24 HRS., Disp: 10 tablet, Rfl: 5 .  Multiple Vitamins-Minerals (MULTIVITAMIN WITH MINERALS) tablet, Take 1 tablet by mouth daily., Disp: , Rfl:  .  pentoxifylline (TRENTAL) 400 MG CR tablet, Take 1 tablet (400 mg total) by mouth 3 (three) times daily with meals., Disp: 90 tablet, Rfl: 3 .  rivastigmine (EXELON) 1.5 MG capsule, TAKE (1) CAPSULE TWICE DAILY., Disp: 60 capsule, Rfl: 11   Exam: Current vital signs: BP (!) 147/91   Pulse 98   Temp 98.1 F (36.7 C) (Oral)   Resp 15   Ht 5\' 5"  (1.651 m)   Wt 72.6 kg   SpO2 95%   BMI 26.63 kg/m  Vital signs in last 24 hours: Temp:  [98.1 F (36.7 C)] 98.1 F (36.7 C) (03/18 1225) Pulse Rate:  [85-98] 98 (03/18 1420) Resp:  [15-20] 15 (03/18 1420) BP: (147-160)/(75-91) 147/91 (03/18 1420) SpO2:  [94 %-98 %] 95 % (03/18 1420) Weight:  [72.6 kg] 72.6 kg (03/18 1223)  GENERAL: Awake, alert  HEENT: - Normocephalic and atraumatic, dry mm,  LUNGS - Normal respiratory effort. SaO2 CV - RRR on tele ABDOMEN - Soft, nontender Ext: warm, well perfused  NEURO:  Mental Status: Alert but not oriented to person, place, or time. Patient does appear to calm down a bit when she hears her name called, but is not able to give staff her name herself. Speech/Language: speech is intermittent and is clear; however patient only yells "oh God, oh no".  Patient otherwise does not have intelligible speech and is yelling and pulling at wires and her gown. Cranial Nerves:  II: PERRL__2__mm/brisk. Patient tracks care providers.  III, IV, VI: EOMI, cross midline. Lid elevation symmetric and full.  V: Sensation is intact to noxious stimuli with focalized withdrawal to pain and facial grimace. VII: Face is symmetric resting and smiling.  VIII: Hearing intact to voice IX, X: Palate elevation is symmetric. Phonation normal.  XI: Patient has pain in her R shoulder, and does not like to move it, but L shoulder patient is able to pulls herself over to her right  side. XII: Tongue protrudes midline without fasciculations.   Motor: Unable to assess strength due to delirium. Tone is normal. Bulk is normal.  Sensation- Intact to noxious stimuli bilaterally in all four extremities. Coordination: Unable to assess due to delirium DTRs: 2+ throughout.  Gait- deferred  Labs I have reviewed labs in epic and the results pertinent to this consultation are: EtOH: <10 PT-INR: 13.4 INR 1.1 APTT: 30  CBC    Component Value Date/Time   WBC 7.1 02/25/2021 1255   RBC 4.12 02/25/2021 1255   HGB 12.6 02/25/2021 1327   HCT 37.0 02/25/2021 1327   PLT 284 02/25/2021 1255   MCV 98.3 02/25/2021 1255   MCH 30.8 02/25/2021 1255   MCHC 31.4 02/25/2021 1255   RDW 14.2 02/25/2021 1255   LYMPHSABS 1.4 02/25/2021  1255   MONOABS 0.8 02/25/2021 1255   EOSABS 0.1 02/25/2021 1255   BASOSABS 0.1 02/25/2021 1255    CMP     Component Value Date/Time   NA 139 02/25/2021 1327   K 4.0 02/25/2021 1327   CL 106 02/25/2021 1327   CO2 22 02/25/2021 1255   GLUCOSE 110 (H) 02/25/2021 1327   BUN 15 02/25/2021 1327   CREATININE 0.70 02/25/2021 1327   CALCIUM 9.1 02/25/2021 1255   PROT 6.9 02/25/2021 1255   ALBUMIN 4.1 02/25/2021 1255   AST 27 02/25/2021 1255   ALT 23 02/25/2021 1255   ALKPHOS 58 02/25/2021 1255   BILITOT 1.1 02/25/2021 1255   GFRNONAA >60 02/25/2021 1255   GFRAA >60 11/26/2018 0522    Lipid Panel     Component Value Date/Time   CHOL 214 (H) 11/10/2014 0335   TRIG 172 (H) 11/10/2014 0335   HDL 62 11/10/2014 0335   CHOLHDL 3.5 11/10/2014 0335   VLDL 34 11/10/2014 0335   LDLCALC 118 (H) 11/10/2014 0335     Imaging I have reviewed the images obtained:  CT Head wo contrast IMPRESSION: 1. Motion degraded scan, limiting assessment. 2. Loss of gray-white differentiation throughout left parietal and occipital lobes with associated mild sulcal effacement, suspicious for acute left PCA territory infarct. 3. No acute intracranial hemorrhage.  No  midline shift. 4. Generalized cerebral volume loss and moderate chronic small vessel ischemic changes in the cerebral white matter.   Impression: Mrs. Mervine is a 87 yo patient who has no previous hx of CVA but does have hx of seizures and per family has been taking her Keppra as prescribed. Patient received CT Head upon presentation is noted to have loss of gray-white differentiation throughout L parietal and occipital lobes suspicious for acute L PCA territory infarct. Will follow with CTA but continue to treat patient for possible breakthrough seizures as her hx from family reports similar episodes in the past. Patient was not able to participate in an NIHSS due to significant delirium, but was noted to have spontaneous movement and response to noxious stimuli in all 4 extremities, with eyes able to track midline, and pupils PERRLA. Patient WBC is WNL decreasing concern that patient AMS is 2/2 infectious etiology.   Recommendations: - Keppra load 200mg  followed by 1500mg  BID - F/u TSH - F/u B12 - F/u CTA Head and Neck - F/u UA -F/u Xray R shoulder - HgbA1c, fasting lipid panel - Consider continuing zetia 10mg  - Consider echo when patient is more stable - Continue home antihypertensive medications - PT and OT consult  - Risk factor modification - Telemetry monitoring - Frequent neuro checks - NPO until passes stroke swallow screen    Damita Dunnings, MD PGY-1

## 2021-02-25 NOTE — ED Notes (Signed)
Pt sleeping. 

## 2021-02-25 NOTE — Telephone Encounter (Signed)
FYI

## 2021-02-25 NOTE — Telephone Encounter (Signed)
Patient's daughter called and said the patient had a bowel accident in bed this morning and seemed to be having another seizure. She got the information second hand from her father since she is currently in [place].   She'd like a call back about this as soon as possible and will be calling EMS to have them assess the patient.

## 2021-02-25 NOTE — ED Notes (Signed)
Pt still wild valium gven a few minutes ago

## 2021-02-25 NOTE — H&P (Signed)
History and Physical    Renee Zuniga PZW:[id number] DOB: [birth year] DOA: 02/25/2021  PCP: Seward Carol, MD  Patient coming from: Home via EMS  I have personally briefly reviewed patient's old medical records in [place]  Chief Complaint: Altered mental status, seizure-like activity  HPI: Renee Zuniga is a 87 y.o. female with medical history significant for seizure disorder, hypertension, hyperlipidemia, history of TIA, hypothyroidism, MCI/dementia who presents to the ED for evaluation of suspected seizure activity.  Patient unable to provide any history due to suspected prolonged postictal state and/or medication effect therefore entirety of history is obtained from EDP, chart review, and patient's daughter at bedside.  Daughter states that patient lives alone but has 24/7 care.  She was in her usual state of health yesterday.  This morning daughter found patient with a glazed over look and incontinent of urine and stool.  Daughter felt this was similar to her prior seizure spells.  EMS were called and patient was brought to the ED for further evaluation.  Daughter states that patient's PCP did call today to notify that she has a UTI, daughter believes this was reported as E. coli.  ED Course:  Initial vitals showed BP 160/78, pulse 85, RR 20, temp 98.1 F, SPO2 94% on room air.  Labs show sodium 138, potassium 3.9, bicarb 22, BUN 14, creatinine 0.79, serum glucose 112, LFTs within normal limits, WBC 7.1, hemoglobin 12.7, platelets 284,000.  Serum ethanol <10.  UDS negative.  Urinalysis shows negative protein, negative nitrites, small leukocytes, 6-10 RBC/hpf, 6-10 WBC/hpf, rare bacteria on microscopy.  CT head without contrast initially read as possible left PCA territory infarct however it was determined there was artifact with the CT unit and radiology has documented that they believe there is no acute abnormality affecting the left hemisphere.  CTA head and neck is a motion  degraded exam without definite acute intracranial abnormality.  Remote left cerebellar infarct noted.  Negative for LVO.  Right shoulder x-ray is negative for acute abnormality.  Moderate AC osteoarthritis noted.  Neurology were consulted and have loaded patient with 2000 mg IV Keppra to be followed by 1500 mg IV Keppra every 12 hours.  Patient received IV Ativan 0.5 mg and 1 mg, IV Valium 5 mg, and IV ceftriaxone 1 g.  The hospitalist service was consulted to admit for further evaluation and management.  Review of Systems:  Unable to obtain full review of systems due to encephalopathy.   Past Medical History:  Diagnosis Date  . Arthritis   . Heart murmur   . High blood pressure   . HTN (hypertension) 11/09/2014  . Hypercholesteremia   . Insomnia   . Localization-related idiopathic epilepsy and epileptic syndromes with seizures of localized onset, not intractable, without status epilepticus ([place]) 08/12/2015  . Migraine headache   . Mild neurocognitive disorder, amnestic type 08/22/2017  . Mitral valve prolapse   . Osteoarthritis   . Osteopenia   . Pelvis fracture ([place]) 11/26/2018  . Seizures ([place])   . TIA (transient ischemic attack) 11/09/2014  . Tinnitus   . Vitamin D deficiency     Past Surgical History:  Procedure Laterality Date  . CHOLECYSTECTOMY    . CORRECTION HAMMER TOE Bilateral   . GALLBLADDER SURGERY    . OOPHORECTOMY     benign tumor, per patient  . PARTIAL HYSTERECTOMY  1974   due to uterine prolaspe    Social History:  reports that she has never smoked. She has never  used smokeless tobacco. She reports current alcohol use of about 7.0 standard drinks of alcohol per week. She reports that she does not use drugs.  Allergies  Allergen Reactions  . Ambien [Zolpidem] Nausea And Vomiting  . Demerol [Meperidine] Nausea And Vomiting       . Lunesta [Eszopiclone] Other (See Comments)    Affected speech  . Tramadol     Family History  Problem Relation Age  of Onset  . Mitral valve prolapse Father   . Heart Problems Mother   . Hypertension Mother      Prior to Admission medications   Medication Sig Start Date End Date Taking? Authorizing Provider  acetaminophen (TYLENOL) 325 MG tablet Take 2 tablets (650 mg total) by mouth every 6 (six) hours as needed for mild pain (or Fever >/= 101). 11/29/18   Elgergawy, Silver Huguenin, MD  Calcium Carb-Cholecalciferol (OYSTER SHELL CALCIUM/VITAMIN D) 500-200 MG-UNIT TABS Take 1 tablet by mouth 2 (two) times daily. Patient not taking: Reported on 09/15/2020    [provider]  cyclobenzaprine (FLEXERIL) 5 MG tablet Take 1 tablet (5 mg total) by mouth 3 (three) times daily as needed for muscle spasms. 11/29/18   Elgergawy, Silver Huguenin, MD  diclofenac sodium (VOLTAREN) 1 % GEL Apply 2 g topically daily. For knee pain    [provider]  escitalopram (LEXAPRO) 10 MG tablet Take 1 tablet (10 mg total) by mouth daily. 09/15/20   Cameron Sprang, MD  ezetimibe (ZETIA) 10 MG tablet Take 10 mg by mouth at bedtime.     [provider]  furosemide (LASIX) 20 MG tablet Take 20 mg by mouth as needed.  08/21/20   [provider]  levETIRAcetam (KEPPRA) 750 MG tablet Take 2 tablets twice a day 09/15/20   Cameron Sprang, MD  levothyroxine (SYNTHROID, LEVOTHROID) 50 MCG tablet Take 50 mcg by mouth daily before breakfast.    [provider]  lisinopril (PRINIVIL,ZESTRIL) 10 MG tablet Take 10 mg by mouth daily.    [provider]  LORazepam (ATIVAN) 1 MG tablet TAKE 1 TABLET AS NEEDED FOR SEIZURE. DO NOT TAKE MORE THAN 2 IN 24 HRS. 09/15/20   Cameron Sprang, MD  Multiple Vitamins-Minerals (MULTIVITAMIN WITH MINERALS) tablet Take 1 tablet by mouth daily.    [provider]  pentoxifylline (TRENTAL) 400 MG CR tablet Take 1 tablet (400 mg total) by mouth 3 (three) times daily with meals. 12/30/20   Newt Minion, MD  rivastigmine (EXELON) 1.5 MG capsule TAKE (1) CAPSULE TWICE  DAILY. 12/27/20   Cameron Sprang, MD    Physical Exam: Vitals:   02/25/21 2111 02/25/21 2130 02/25/21 2200 02/25/21 2230  BP: (!) 137/112 (!) 151/113 (!) 135/101 (!) 147/130  Pulse: 87 79 96 93  Resp: 17 15 (!) 21 15  Temp:      TempSrc:      SpO2: 92% 95% 96% 94%  Weight:      Height:      Exam limited due to somnolence, cooperation Constitutional: Elderly woman resting in the right lateral decubitus position in bed, somnolent and not interactive, appears comfortable.  Safety mittens in place both hands. Eyes: lids and conjunctivae normal ENMT: Mucous membranes are moist. Posterior pharynx clear of any exudate or lesions. Neck: normal, supple, no masses. Respiratory: clear to auscultation bilaterally, no wheezing, no crackles. Normal respiratory effort. No accessory muscle use.  Cardiovascular: Regular rate and rhythm, no murmurs / rubs / gallops. No extremity edema.  2+ pedal pulses. Abdomen: no tenderness, no masses palpated. Bowel sounds positive.  Musculoskeletal: no clubbing / cyanosis. No joint deformity upper and lower extremities. Good ROM, no contractures. Normal muscle tone.  Skin: no rashes, lesions, ulcers. No induration Neurologic: Limited exam, moving all extremities equally Psychiatric: Somnolent from postictal state and/or medication  Labs on Admission: I have personally reviewed following labs and imaging studies  CBC: Recent Labs  Lab 02/25/21 1255 02/25/21 1327  WBC 7.1  --   NEUTROABS 4.7  --   HGB 12.7 12.6  HCT 40.5 37.0  MCV 98.3  --   PLT 284  --    Basic Metabolic Panel: Recent Labs  Lab 02/25/21 1255 02/25/21 1327  NA 138 139  K 3.9 4.0  CL 107 106  CO2 22  --   GLUCOSE 112* 110*  BUN 14 15  CREATININE 0.79 0.70  CALCIUM 9.1  --    GFR: Estimated Creatinine Clearance: 49.4 mL/min (by C-G formula based on SCr of 0.7 mg/dL). Liver Function Tests: Recent Labs  Lab 02/25/21 1255  AST 27  ALT 23  ALKPHOS 58  BILITOT 1.1  PROT 6.9   ALBUMIN 4.1   No results for input(s): LIPASE, AMYLASE in the last 168 hours. No results for input(s): AMMONIA in the last 168 hours. Coagulation Profile: Recent Labs  Lab 02/25/21 1255  INR 1.1   Cardiac Enzymes: No results for input(s): CKTOTAL, CKMB, CKMBINDEX, TROPONINI in the last 168 hours. BNP (last 3 results) No results for input(s): PROBNP in the last 8760 hours. HbA1C: No results for input(s): HGBA1C in the last 72 hours. CBG: No results for input(s): GLUCAP in the last 168 hours. Lipid Profile: No results for input(s): CHOL, HDL, LDLCALC, TRIG, CHOLHDL, LDLDIRECT in the last 72 hours. Thyroid Function Tests: No results for input(s): TSH, T4TOTAL, FREET4, T3FREE, THYROIDAB in the last 72 hours. Anemia Panel: No results for input(s): VITAMINB12, FOLATE, FERRITIN, TIBC, IRON, RETICCTPCT in the last 72 hours. Urine analysis:    Component Value Date/Time   COLORURINE STRAW (A) 02/25/2021 1840   APPEARANCEUR CLEAR 02/25/2021 1840   LABSPEC 1.012 02/25/2021 1840   PHURINE 8.0 02/25/2021 1840   GLUCOSEU NEGATIVE 02/25/2021 1840   HGBUR SMALL (A) 02/25/2021 1840   BILIRUBINUR NEGATIVE 02/25/2021 1840   BILIRUBINUR neg 10/06/2012 0825   KETONESUR 5 (A) 02/25/2021 1840   PROTEINUR NEGATIVE 02/25/2021 1840   UROBILINOGEN 0.2 05/06/2015 1825   NITRITE NEGATIVE 02/25/2021 1840   LEUKOCYTESUR SMALL (A) 02/25/2021 1840    Radiological Exams on Admission: CT ANGIOGRAM HEAD NECK W WO CONTRAST  Result Date: 02/25/2021 CLINICAL DATA:  Initial evaluation for acute stroke. EXAM: CT ANGIOGRAPHY HEAD AND NECK TECHNIQUE: Multidetector CT imaging of the head and neck was performed using the standard protocol during bolus administration of intravenous contrast. Multiplanar CT image reconstructions and MIPs were obtained to evaluate the vascular anatomy. Carotid stenosis measurements (when applicable) are obtained utilizing NASCET criteria, using the distal internal carotid diameter as  the denominator. CONTRAST:  59mL OMNIPAQUE IOHEXOL 350 MG/ML SOLN COMPARISON:  Prior head CT from earlier the same day. FINDINGS: CT HEAD FINDINGS Brain: Examination degraded by motion and patient positioning. Age-related cerebral atrophy with chronic small vessel ischemic disease. Small remote left cerebellar infarct noted. No visible acute large vessel territory infarct. No visible intracranial hemorrhage. No mass lesion, midline shift or mass effect. No hydrocephalus or visible extra-axial fluid collection. Vascular: No hyperdense vessel. Scattered vascular calcifications noted within the carotid siphons.  Skull: Scalp soft tissues and calvarium within normal limits. Sinuses: Visualized paranasal sinuses are clear. No mastoid effusion. Orbits: Globes and orbital soft tissues within normal limits. Review of the MIP images confirms the above findings CTA NECK FINDINGS Aortic arch: Visualized aortic arch of normal caliber with normal branch pattern. No hemodynamically significant stenosis seen about the origin of the great vessels. Right carotid system: Right common and internal carotid arteries widely patent without stenosis, dissection or occlusion. Left carotid system: Left CCA patent from its origin to the bifurcation without stenosis. Mild calcified plaque about the left bifurcation without significant stenosis. Left ICA patent distally without stenosis, dissection or occlusion. Vertebral arteries: Both vertebral arteries arise from the subclavian arteries. No proximal subclavian artery stenosis. Both vertebral arteries widely patent without stenosis, dissection or occlusion. Skeleton: No acute osseous finding. No discrete or worrisome osseous lesions. Other neck: No other acute soft tissue abnormality within the neck. No mass or adenopathy. Upper chest: Visualized upper chest demonstrates no acute finding. Calcified granuloma noted within the left lower lobe. Review of the MIP images confirms the above findings  CTA HEAD FINDINGS Anterior circulation: Petrous segments patent bilaterally. Atheromatous change within the carotid siphons without hemodynamically significant stenosis. A1 segments widely patent. Right A1 hypoplastic. Normal anterior communicating artery complex. Anterior cerebral arteries patent to their distal aspects without stenosis. No M1 stenosis or occlusion. Normal MCA bifurcations. Distal MCA branches well perfused and symmetric. Posterior circulation: Both V4 segments widely patent to the vertebrobasilar junction without stenosis. Neither PICA origin well visualized. Basilar widely patent to its distal aspect without stenosis. Superior cerebellar arteries patent bilaterally. Both PCAs primarily supplied via the basilar well perfused to their distal aspects. Venous sinuses: Patent. Anatomic variants: None significant.  No aneurysm. Review of the MIP images confirms the above findings IMPRESSION: CT HEAD IMPRESSION: 1. Motion degraded exam. 2. No definite acute intracranial abnormality. 3. Age-related cerebral atrophy with chronic small vessel ischemic disease. Small remote left cerebellar infarct. CTA HEAD AND NECK IMPRESSION: 1. Negative CTA for large vessel occlusion. 2. Mild for age atheromatous change about the major arterial vasculature of the head and neck. No hemodynamically significant or correctable stenosis. No other acute vascular abnormality. Electronically Signed   By: Jeannine Boga M.D.   On: 02/25/2021 20:05   DG Shoulder Right  Result Date: 02/25/2021 CLINICAL DATA:  Patient found down today. Right shoulder pain. Initial encounter. EXAM: RIGHT SHOULDER - 2+ VIEW COMPARISON:  None. FINDINGS: No acute bony or joint abnormality is identified. Moderate acromioclavicular osteoarthritis is noted. The glenohumeral joint appears normal. Soft tissues are negative. IMPRESSION: No acute abnormality. Moderate acromioclavicular osteoarthritis. Electronically Signed   By: Inge Rise M.D.    On: 02/25/2021 20:12   CT HEAD WO CONTRAST  Addendum Date: 02/25/2021   ADDENDUM REPORT: 02/25/2021 20:05 ADDENDUM: It has come to our attention that this particular CT unit has developed an artifact today which causes this low-density in this same location of the left hemisphere. In all cases to this point, this has been artifactual and not indicative of true pathology. If there is clinical question, the exam could be repeated, but I believe that there is no acute abnormality affecting the left hemisphere. These results will be called to the ordering clinician or representative by the Radiologist Assistant, and communication documented in the PACS or [employer]. Electronically Signed   By: Nelson Chimes M.D.   On: 02/25/2021 20:05   Result Date: 02/25/2021 CLINICAL DATA:  Seizure. Acute CVA  suspected. Neuro deficit. Last normal yesterday. EXAM: CT HEAD WITHOUT CONTRAST TECHNIQUE: Contiguous axial images were obtained from the base of the skull through the vertex without intravenous contrast. COMPARISON:  08/19/2016 MRI brain. 08/20/2016 CT angiogram of the head neck. FINDINGS: Brain: Motion degraded scan, limiting assessment. Loss of gray-white differentiation throughout left parietal and occipital lobes with associated mild sulcal effacement. No extra-axial collection or parenchymal hemorrhage. No midline shift or mass lesion. Moderate periventricular and subcortical white matter hypodensity. Generalized cerebral volume loss. Generalized ventricular dilatation is not substantially changed and compatible with cerebral volume loss. Vascular: No acute abnormality. Skull: No evidence of calvarial fracture. Sinuses/Orbits: The visualized paranasal sinuses are essentially clear. Other:  The mastoid air cells are unopacified. IMPRESSION: 1. Motion degraded scan, limiting assessment. 2. Loss of gray-white differentiation throughout left parietal and occipital lobes with associated mild sulcal effacement,  suspicious for acute left PCA territory infarct. 3. No acute intracranial hemorrhage.  No midline shift. 4. Generalized cerebral volume loss and moderate chronic small vessel ischemic changes in the cerebral white matter. Critical Value/emergent results were called by telephone at the time of interpretation on 02/25/2021 at 2:55 pm to provider [facility], who verbally acknowledged these results. Electronically Signed: By: Ilona Sorrel M.D. On: 02/25/2021 15:43    EKG: Personally reviewed. Sinus rhythm without acute ischemic changes, QTC 487.  When compared to prior, PVC no longer present.  Assessment/Plan Principal Problem:   Seizure-like activity (HCC) Active Problems:   Hypercholesteremia   HTN (hypertension)   Mild neurocognitive disorder, amnestic type   Hypothyroidism   Renee Zuniga is a 87 y.o. female with medical history significant for seizure disorder, hypertension, hyperlipidemia, history of TIA, hypothyroidism, MCI who is admitted for seizure activity.  Suspected seizure-like activity with postictal state in history of seizure disorder: Seen by neurology, patient loaded with IV Keppra.  Initial CT imaging artifactual. -Loaded IV Keppra 2000 mg followed by 1500 mg twice daily -Seizure precautions -Follow EEG -Continue neurochecks -N.p.o. until mental status improves -MRI brain and further stroke work-up per neurology when patient is more cooperative  Reported E. coli UTI: Daughter states that patient's PCP notified them today that she has a UTI, reported as E. coli. -Continue empiric IV ceftriaxone -Add on urine culture  Hypertension: Resume lisinopril when able to take oral medications.  Hypothyroidism: Resume Synthroid when able.  Hyperlipidemia: Resume Zetia when able.  MCI/dementia: Holding rivastigmine for now.  DVT prophylaxis: Lovenox Code Status: DNR, confirmed with patient's daughter at bedside Family Communication: Discussed with patient's daughter at  bedside on admission Disposition Plan: From home and likely discharge home pending further neuro work-up, improvement in mental status Consults called: Neurology Level of care: Telemetry Medical Admission status:  Status is: Observation  The patient remains OBS appropriate and will d/c before 2 midnights.  Dispo: The patient is from: Home              Anticipated d/c is to: Home              Patient currently is not medically stable to d/c.   Difficult to place patient No  Zada Finders MD [facility]  If 7PM-7AM, please contact night-coverage [email or web address]  02/25/2021, 10:57 PM

## 2021-02-25 NOTE — Telephone Encounter (Signed)
Patient's daughter called and said the patient is now at [facility] by ambulance. She is not recognizing her sister.   [facility] has a rehab bed open for her when/if she needs it. They a current FL2 form sent to [facility] there. Phone: [phone]

## 2021-02-25 NOTE — ED Notes (Signed)
Patient transported to CT 

## 2021-02-26 ENCOUNTER — Observation Stay (HOSPITAL_COMMUNITY): Payer: Medicare Other

## 2021-02-26 ENCOUNTER — Encounter (HOSPITAL_COMMUNITY): Payer: Self-pay | Admitting: Internal Medicine

## 2021-02-26 DIAGNOSIS — E785 Hyperlipidemia, unspecified: Secondary | ICD-10-CM | POA: Diagnosis present

## 2021-02-26 DIAGNOSIS — D649 Anemia, unspecified: Secondary | ICD-10-CM | POA: Diagnosis present

## 2021-02-26 DIAGNOSIS — E7800 Pure hypercholesterolemia, unspecified: Secondary | ICD-10-CM

## 2021-02-26 DIAGNOSIS — M2550 Pain in unspecified joint: Secondary | ICD-10-CM | POA: Diagnosis not present

## 2021-02-26 DIAGNOSIS — Z885 Allergy status to narcotic agent status: Secondary | ICD-10-CM | POA: Diagnosis not present

## 2021-02-26 DIAGNOSIS — I6782 Cerebral ischemia: Secondary | ICD-10-CM | POA: Diagnosis not present

## 2021-02-26 DIAGNOSIS — Z8673 Personal history of transient ischemic attack (TIA), and cerebral infarction without residual deficits: Secondary | ICD-10-CM | POA: Diagnosis not present

## 2021-02-26 DIAGNOSIS — Z8249 Family history of ischemic heart disease and other diseases of the circulatory system: Secondary | ICD-10-CM | POA: Diagnosis not present

## 2021-02-26 DIAGNOSIS — M1990 Unspecified osteoarthritis, unspecified site: Secondary | ICD-10-CM | POA: Diagnosis not present

## 2021-02-26 DIAGNOSIS — R569 Unspecified convulsions: Secondary | ICD-10-CM | POA: Diagnosis not present

## 2021-02-26 DIAGNOSIS — E039 Hypothyroidism, unspecified: Secondary | ICD-10-CM | POA: Diagnosis present

## 2021-02-26 DIAGNOSIS — Z66 Do not resuscitate: Secondary | ICD-10-CM | POA: Diagnosis present

## 2021-02-26 DIAGNOSIS — Z888 Allergy status to other drugs, medicaments and biological substances status: Secondary | ICD-10-CM | POA: Diagnosis not present

## 2021-02-26 DIAGNOSIS — I341 Nonrheumatic mitral (valve) prolapse: Secondary | ICD-10-CM | POA: Diagnosis present

## 2021-02-26 DIAGNOSIS — N390 Urinary tract infection, site not specified: Secondary | ICD-10-CM | POA: Diagnosis present

## 2021-02-26 DIAGNOSIS — G40909 Epilepsy, unspecified, not intractable, without status epilepticus: Secondary | ICD-10-CM | POA: Diagnosis not present

## 2021-02-26 DIAGNOSIS — Z79890 Hormone replacement therapy (postmenopausal): Secondary | ICD-10-CM | POA: Diagnosis not present

## 2021-02-26 DIAGNOSIS — I10 Essential (primary) hypertension: Secondary | ICD-10-CM | POA: Diagnosis present

## 2021-02-26 DIAGNOSIS — Z9049 Acquired absence of other specified parts of digestive tract: Secondary | ICD-10-CM | POA: Diagnosis not present

## 2021-02-26 DIAGNOSIS — Z515 Encounter for palliative care: Secondary | ICD-10-CM | POA: Diagnosis not present

## 2021-02-26 DIAGNOSIS — B952 Enterococcus as the cause of diseases classified elsewhere: Secondary | ICD-10-CM | POA: Diagnosis not present

## 2021-02-26 DIAGNOSIS — Z7401 Bed confinement status: Secondary | ICD-10-CM | POA: Diagnosis not present

## 2021-02-26 DIAGNOSIS — F0281 Dementia in other diseases classified elsewhere with behavioral disturbance: Secondary | ICD-10-CM | POA: Diagnosis present

## 2021-02-26 DIAGNOSIS — M19011 Primary osteoarthritis, right shoulder: Secondary | ICD-10-CM | POA: Diagnosis present

## 2021-02-26 DIAGNOSIS — R509 Fever, unspecified: Secondary | ICD-10-CM | POA: Diagnosis not present

## 2021-02-26 DIAGNOSIS — F0391 Unspecified dementia with behavioral disturbance: Secondary | ICD-10-CM | POA: Diagnosis not present

## 2021-02-26 DIAGNOSIS — Z79899 Other long term (current) drug therapy: Secondary | ICD-10-CM | POA: Diagnosis not present

## 2021-02-26 DIAGNOSIS — J690 Pneumonitis due to inhalation of food and vomit: Secondary | ICD-10-CM | POA: Diagnosis not present

## 2021-02-26 DIAGNOSIS — G9349 Other encephalopathy: Secondary | ICD-10-CM | POA: Diagnosis present

## 2021-02-26 DIAGNOSIS — R404 Transient alteration of awareness: Secondary | ICD-10-CM | POA: Diagnosis not present

## 2021-02-26 DIAGNOSIS — Z90711 Acquired absence of uterus with remaining cervical stump: Secondary | ICD-10-CM | POA: Diagnosis not present

## 2021-02-26 DIAGNOSIS — B9689 Other specified bacterial agents as the cause of diseases classified elsewhere: Secondary | ICD-10-CM | POA: Diagnosis present

## 2021-02-26 DIAGNOSIS — B9620 Unspecified Escherichia coli [E. coli] as the cause of diseases classified elsewhere: Secondary | ICD-10-CM | POA: Diagnosis present

## 2021-02-26 DIAGNOSIS — Z20822 Contact with and (suspected) exposure to covid-19: Secondary | ICD-10-CM | POA: Diagnosis present

## 2021-02-26 DIAGNOSIS — N8110 Cystocele, unspecified: Secondary | ICD-10-CM | POA: Diagnosis not present

## 2021-02-26 DIAGNOSIS — G40009 Localization-related (focal) (partial) idiopathic epilepsy and epileptic syndromes with seizures of localized onset, not intractable, without status epilepticus: Secondary | ICD-10-CM | POA: Diagnosis present

## 2021-02-26 LAB — CBC
HCT: 38.8 % (ref 36.0–46.0)
Hemoglobin: 12.6 g/dL (ref 12.0–15.0)
MCH: 31.4 pg (ref 26.0–34.0)
MCHC: 32.5 g/dL (ref 30.0–36.0)
MCV: 96.8 fL (ref 80.0–100.0)
Platelets: 270 10*3/uL (ref 150–400)
RBC: 4.01 MIL/uL (ref 3.87–5.11)
RDW: 13.8 % (ref 11.5–15.5)
WBC: 8.6 10*3/uL (ref 4.0–10.5)
nRBC: 0 % (ref 0.0–0.2)

## 2021-02-26 LAB — LIPID PANEL
Cholesterol: 211 mg/dL — ABNORMAL HIGH (ref 0–200)
HDL: 65 mg/dL (ref >40)
LDL Cholesterol: 123 mg/dL — ABNORMAL HIGH (ref 0–99)
Total CHOL/HDL Ratio: 3.2 RATIO
Triglycerides: 115 mg/dL (ref <150)
VLDL: 23 mg/dL (ref 0–40)

## 2021-02-26 LAB — BASIC METABOLIC PANEL
Anion gap: 10 (ref 5–15)
BUN: 8 mg/dL (ref 8–23)
CO2: 22 mmol/L (ref 22–32)
Calcium: 8.7 mg/dL — ABNORMAL LOW (ref 8.9–10.3)
Chloride: 101 mmol/L (ref 98–111)
Creatinine, Ser: 0.84 mg/dL (ref 0.44–1.00)
GFR, Estimated: >60 mL/min (ref >60)
Glucose, Bld: 123 mg/dL — ABNORMAL HIGH (ref 70–99)
Potassium: 3.9 mmol/L (ref 3.5–5.1)
Sodium: 133 mmol/L — ABNORMAL LOW (ref 135–145)

## 2021-02-26 LAB — HEMOGLOBIN A1C
Hgb A1c MFr Bld: 5.5 % (ref 4.8–5.6)
Mean Plasma Glucose: 111.15 mg/dL

## 2021-02-26 LAB — VITAMIN B12: Vitamin B-12: 402 pg/mL (ref 180–914)

## 2021-02-26 LAB — TSH: TSH: 3.055 u[IU]/mL (ref 0.350–4.500)

## 2021-02-26 MED ORDER — SODIUM CHLORIDE 0.9 % IV SOLN
INTRAVENOUS | Status: DC | PRN
  Administered 2021-02-26: 1000 mL via INTRAVENOUS

## 2021-02-26 MED ORDER — HALOPERIDOL LACTATE 5 MG/ML IJ SOLN
2.0000 mg | Freq: Four times a day (QID) | INTRAMUSCULAR | Status: DC | PRN
  Administered 2021-02-28: 2 mg via INTRAVENOUS
  Filled 2021-02-26: qty 1

## 2021-02-26 MED ORDER — PIPERACILLIN-TAZOBACTAM 3.375 G IVPB
3.3750 g | Freq: Three times a day (TID) | INTRAVENOUS | Status: DC
  Administered 2021-02-26 – 2021-02-28 (×5): 3.375 g via INTRAVENOUS
  Filled 2021-02-26 (×5): qty 50

## 2021-02-26 MED ORDER — HALOPERIDOL LACTATE 5 MG/ML IJ SOLN
1.0000 mg | Freq: Once | INTRAMUSCULAR | Status: DC | PRN
  Filled 2021-02-26: qty 1

## 2021-02-26 MED ORDER — SODIUM CHLORIDE 0.9 % IV SOLN: INTRAVENOUS | Status: DC

## 2021-02-26 MED ORDER — PIPERACILLIN-TAZOBACTAM 3.375 G IVPB 30 MIN
3.3750 g | Freq: Once | INTRAVENOUS | Status: AC
  Administered 2021-02-26: 3.375 g via INTRAVENOUS
  Filled 2021-02-26 (×2): qty 50

## 2021-02-26 NOTE — Progress Notes (Signed)
    02/26/21 0836  Assess: MEWS Score  Temp (!) 100.9 F (38.3 C)  BP (!) 178/75  ECG Heart Rate (!) 114  Level of Consciousness Responds to Voice  SpO2 94 %  O2 Device Room Air  Assess: MEWS Score  MEWS Temp 1  MEWS Systolic 0  MEWS Pulse 2  MEWS RR 1  MEWS LOC 1  MEWS Score 5  MEWS Score Color Red   MD informed via secure chat

## 2021-02-26 NOTE — ED Notes (Signed)
Report attempted unsuccessful  Charge nurse has not approved the admit on3w

## 2021-02-26 NOTE — ED Notes (Signed)
The pt  Is not any more responsive all shift the pt has been tossing and turning pulling all things off no verbal response except to moaning no attempts to answer questions.  She still does not know her daughter who is at  The bedside

## 2021-02-26 NOTE — Progress Notes (Signed)
Pharmacy Antibiotic Note  Renee Zuniga is a 87 y.o. female admitted on 02/25/2021 with aspiration pneumonia and UTI.  Pharmacy has been consulted for Zosyn dosing.  Plan: Zosyn 3.375g IV q8h (4 hour infusion).  Monitor clinical progress, cultures/sensitivities, renal function, abx plan   Height: 5\' 5"  (165.1 cm) Weight: 72.6 kg (160 lb) IBW/kg (Calculated) : 57  Temp (24hrs), Avg:99.8 F (37.7 C), Min:98.3 F (36.8 C), Max:100.9 F (38.3 C)  Recent Labs  Lab 02/25/21 1255 02/25/21 1327 02/26/21 0221  WBC 7.1  --  8.6  CREATININE 0.79 0.70 0.84    Estimated Creatinine Clearance: 47.1 mL/min (by C-G formula based on SCr of 0.84 mg/dL).    Allergies  Allergen Reactions  . Ambien [Zolpidem] Nausea And Vomiting  . Demerol [Meperidine] Nausea And Vomiting       . Lunesta [Eszopiclone] Other (See Comments)    Affected speech  . Tramadol     Antimicrobials this admission: 3/19 Zosyn >>     Dose adjustments this admission:  Microbiology results: 3/18 BCx: sent 3/18 UCx: sent  3/18 Cov/Flu: negative     Thank you for allowing [place] to participate in this patients care. Jens Som, PharmD 02/26/2021 2:08 PM  Please check [email or web address] for unit-specific pharmacy phone numbers.

## 2021-02-26 NOTE — ED Notes (Signed)
Pt cannot pass her swallow screen she is not alert she does not follow commands she keeps both her eyes closed almost constantly

## 2021-02-26 NOTE — ED Notes (Incomplete)
The pt has removed her clothes pulled off her wires again  And is trying her best to remove the mittens that have been placed for hours  Her daughter remains at  The bedside

## 2021-02-26 NOTE — Progress Notes (Signed)
PROGRESS NOTE    Renee Zuniga  OEU:[id number] DOB: 05/10/1931 DOA: 02/25/2021 PCP: Seward Carol, MD   Brief Narrative:  Per admitting HPI: Renee Zuniga is a 87 y.o. female with medical history significant for seizure disorder, hypertension, hyperlipidemia, history of TIA, hypothyroidism, MCI/dementia who presents to the ED for evaluation of suspected seizure activity.  Patient unable to provide any history due to suspected prolonged postictal state and/or medication effect therefore entirety of history is obtained from EDP, chart review, and patient's daughter at bedside.  Daughter states that patient lives alone but has 24/7 care.  She was in her usual state of health yesterday.  This morning daughter found patient with a glazed over look and incontinent of urine and stool.  Daughter felt this was similar to her prior seizure spells.  EMS were called and patient was brought to the ED for further evaluation.  Daughter states that patient's PCP did call today to notify that she has a UTI, daughter believes this was reported as E. coli.    Assessment & Plan:   Principal Problem:   Seizure-like activity ([place]) Active Problems:   Hypercholesteremia   HTN (hypertension)   Mild neurocognitive disorder, amnestic type   Hypothyroidism   Renee Zuniga is a 87 y.o. female with medical history significant for seizure disorder, hypertension, hyperlipidemia, history of TIA, hypothyroidism, MCI who is admitted for seizure activity.  Suspected seizure-like activity with postictal state in history of seizure disorder: Seen by neurology, patient loaded with IV Keppra.  Initial CT imaging artifactual. -Loaded IV Keppra 2000 mg followed by 1500 mg twice daily -Seizure precautions -EEG WITHOUT ACUTE SEIZURE ACTIVITY -Continue neurochecks -N.p.o. until mental status improves -Further work-up per neurology when patient is more alert  FEVER: -cxr with edema vs viral -broaden abx for possible  aspiration -checking blood cultures -tx for uti as below  Reported E. coli UTI: Daughter states that patient's PCP notified them today that she has a UTI, reported as E. coli. -changed abx as above -f/up urine culture  Hypertension: Resume lisinopril when able to take oral medications.  Hypothyroidism: Resume Synthroid when able.  Hyperlipidemia: Resume Zetia when able.  MCI/dementia: Holding rivastigmine for now.  DVT prophylaxis: Lovenox SQ  Code Status: DNR    Code Status Orders  (From admission, onward)         Start     Ordered   02/25/21 2324  Do not attempt resuscitation (DNR)  Continuous       Question Answer Comment  In the event of cardiac or respiratory ARREST Do not call a "code blue"   In the event of cardiac or respiratory ARREST Do not perform Intubation, CPR, defibrillation or ACLS   In the event of cardiac or respiratory ARREST Use medication by any route, position, wound care, and other measures to relive pain and suffering. May use oxygen, suction and manual treatment of airway obstruction as needed for comfort.      02/25/21 2324        Code Status History    Date Active Date Inactive Code Status Order ID Comments User Context   11/26/2018 0434 11/29/2018 1627 Full Code [id number]  Rise Patience, MD ED   08/19/2016 1943 08/20/2016 1620 Full Code [id number]  Florencia Reasons, MD Inpatient   11/09/2014 2353 11/10/2014 1930 Full Code [id number]  Ivor Costa, MD Inpatient   Advance Care Planning Activity     Family Communication: DAUGHTER AT BEDSIDE  Disposition Plan:   change  to inpt with [facility]  Remains inpatient appropriate because:, IV treatments appropriate due to intensity of illness or inability to take PO and Inpatient level of care appropriate due to severity of illness,  Dispo: Patient From: Home Planned Disposition: Home with Health Care Svc medically stable for discharge: No  Consults called:  None Admission status: inpt  Consultants:   neuro  Procedures:  CT ANGIOGRAM HEAD NECK W WO CONTRAST  Result Date: 02/25/2021 CLINICAL DATA:  Initial evaluation for acute stroke. EXAM: CT ANGIOGRAPHY HEAD AND NECK TECHNIQUE: Multidetector CT imaging of the head and neck was performed using the standard protocol during bolus administration of intravenous contrast. Multiplanar CT image reconstructions and MIPs were obtained to evaluate the vascular anatomy. Carotid stenosis measurements (when applicable) are obtained utilizing NASCET criteria, using the distal internal carotid diameter as the denominator. CONTRAST:  49mL OMNIPAQUE IOHEXOL 350 MG/ML SOLN COMPARISON:  Prior head CT from earlier the same day. FINDINGS: CT HEAD FINDINGS Brain: Examination degraded by motion and patient positioning. Age-related cerebral atrophy with chronic small vessel ischemic disease. Small remote left cerebellar infarct noted. No visible acute large vessel territory infarct. No visible intracranial hemorrhage. No mass lesion, midline shift or mass effect. No hydrocephalus or visible extra-axial fluid collection. Vascular: No hyperdense vessel. Scattered vascular calcifications noted within the carotid siphons. Skull: Scalp soft tissues and calvarium within normal limits. Sinuses: Visualized paranasal sinuses are clear. No mastoid effusion. Orbits: Globes and orbital soft tissues within normal limits. Review of the MIP images confirms the above findings CTA NECK FINDINGS Aortic arch: Visualized aortic arch of normal caliber with normal branch pattern. No hemodynamically significant stenosis seen about the origin of the great vessels. Right carotid system: Right common and internal carotid arteries widely patent without stenosis, dissection or occlusion. Left carotid system: Left CCA patent from its origin to the bifurcation without stenosis. Mild calcified plaque about the left bifurcation without significant stenosis. Left  ICA patent distally without stenosis, dissection or occlusion. Vertebral arteries: Both vertebral arteries arise from the subclavian arteries. No proximal subclavian artery stenosis. Both vertebral arteries widely patent without stenosis, dissection or occlusion. Skeleton: No acute osseous finding. No discrete or worrisome osseous lesions. Other neck: No other acute soft tissue abnormality within the neck. No mass or adenopathy. Upper chest: Visualized upper chest demonstrates no acute finding. Calcified granuloma noted within the left lower lobe. Review of the MIP images confirms the above findings CTA HEAD FINDINGS Anterior circulation: Petrous segments patent bilaterally. Atheromatous change within the carotid siphons without hemodynamically significant stenosis. A1 segments widely patent. Right A1 hypoplastic. Normal anterior communicating artery complex. Anterior cerebral arteries patent to their distal aspects without stenosis. No M1 stenosis or occlusion. Normal MCA bifurcations. Distal MCA branches well perfused and symmetric. Posterior circulation: Both V4 segments widely patent to the vertebrobasilar junction without stenosis. Neither PICA origin well visualized. Basilar widely patent to its distal aspect without stenosis. Superior cerebellar arteries patent bilaterally. Both PCAs primarily supplied via the basilar well perfused to their distal aspects. Venous sinuses: Patent. Anatomic variants: None significant.  No aneurysm. Review of the MIP images confirms the above findings IMPRESSION: CT HEAD IMPRESSION: 1. Motion degraded exam. 2. No definite acute intracranial abnormality. 3. Age-related cerebral atrophy with chronic small vessel ischemic disease. Small remote left cerebellar infarct. CTA HEAD AND NECK IMPRESSION: 1. Negative CTA for large vessel occlusion. 2. Mild for age atheromatous change about the major arterial vasculature of the head and neck. No hemodynamically significant or correctable  stenosis. No other acute vascular abnormality. Electronically Signed   By: Jeannine Boga M.D.   On: 02/25/2021 20:05   DG Shoulder Right  Result Date: 02/25/2021 CLINICAL DATA:  Patient found down today. Right shoulder pain. Initial encounter. EXAM: RIGHT SHOULDER - 2+ VIEW COMPARISON:  None. FINDINGS: No acute bony or joint abnormality is identified. Moderate acromioclavicular osteoarthritis is noted. The glenohumeral joint appears normal. Soft tissues are negative. IMPRESSION: No acute abnormality. Moderate acromioclavicular osteoarthritis. Electronically Signed   By: Inge Rise M.D.   On: 02/25/2021 20:12   CT HEAD WO CONTRAST  Addendum Date: 02/25/2021   ADDENDUM REPORT: 02/25/2021 20:05 ADDENDUM: It has come to our attention that this particular CT unit has developed an artifact today which causes this low-density in this same location of the left hemisphere. In all cases to this point, this has been artifactual and not indicative of true pathology. If there is clinical question, the exam could be repeated, but I believe that there is no acute abnormality affecting the left hemisphere. These results will be called to the ordering clinician or representative by the Radiologist Assistant, and communication documented in the PACS or [employer]. Electronically Signed   By: Nelson Chimes M.D.   On: 02/25/2021 20:05   Result Date: 02/25/2021 CLINICAL DATA:  Seizure. Acute CVA suspected. Neuro deficit. Last normal yesterday. EXAM: CT HEAD WITHOUT CONTRAST TECHNIQUE: Contiguous axial images were obtained from the base of the skull through the vertex without intravenous contrast. COMPARISON:  08/19/2016 MRI brain. 08/20/2016 CT angiogram of the head neck. FINDINGS: Brain: Motion degraded scan, limiting assessment. Loss of gray-white differentiation throughout left parietal and occipital lobes with associated mild sulcal effacement. No extra-axial collection or parenchymal hemorrhage. No  midline shift or mass lesion. Moderate periventricular and subcortical white matter hypodensity. Generalized cerebral volume loss. Generalized ventricular dilatation is not substantially changed and compatible with cerebral volume loss. Vascular: No acute abnormality. Skull: No evidence of calvarial fracture. Sinuses/Orbits: The visualized paranasal sinuses are essentially clear. Other:  The mastoid air cells are unopacified. IMPRESSION: 1. Motion degraded scan, limiting assessment. 2. Loss of gray-white differentiation throughout left parietal and occipital lobes with associated mild sulcal effacement, suspicious for acute left PCA territory infarct. 3. No acute intracranial hemorrhage.  No midline shift. 4. Generalized cerebral volume loss and moderate chronic small vessel ischemic changes in the cerebral white matter. Critical Value/emergent results were called by telephone at the time of interpretation on 02/25/2021 at 2:55 pm to provider [facility], who verbally acknowledged these results. Electronically Signed: By: Ilona Sorrel M.D. On: 02/25/2021 15:43   DG CHEST PORT 1 VIEW  Result Date: 02/26/2021 CLINICAL DATA:  87 year old female with fever.  Seizure. EXAM: PORTABLE CHEST 1 VIEW COMPARISON:  Portable chest 08/19/2016. FINDINGS: Portable AP semi upright view at 0907 hours. Lung volumes and mediastinal contours have not significantly changed. Visualized tracheal air column is within normal limits. Mild increased interstitial markings diffusely compared to 2017. No overt edema. No pneumothorax, pleural effusion or confluent opacity. Mild levoconvex upper thoracic scoliosis. No acute osseous abnormality identified. Cholecystectomy clips in the right upper quadrant with a paucity of upper abdominal bowel gas. IMPRESSION: 1. Mild diffuse increased pulmonary interstitial opacity. This might be vascular congestion without overt edema, although viral/atypical respiratory infection is difficult to exclude in  the setting of fever. 2. No other acute cardiopulmonary abnormality. Electronically Signed   By: Genevie Ann M.D.   On: 02/26/2021 09:42   EEG adult  Result  Date: 02/26/2021 Lora Havens, MD     02/26/2021  7:03 AM Patient Name: AMATULLAH CHRISTY MRN: [id number] Epilepsy Attending: Lora Havens Referring Physician/Provider: Dr Su Monks Date: 02/26/2021 Duration: 24.02 mins Patient history: 87yo F with seizure like activity. EEG to evaluate for seizure Level of alertness: Awake AEDs during EEG study: LEV Technical aspects: This EEG study was done with scalp electrodes positioned according to the 10-20 International system of electrode placement. Electrical activity was acquired at a sampling rate of 500Hz  and reviewed with a high frequency filter of 70Hz  and a low frequency filter of 1Hz . EEG data were recorded continuously and digitally stored. Description: No posterior dominant rhythm was seen. EEG showed continuous generalized 3 to 6 Hz theta-delta slowing. Hyperventilation and photic stimulation were not performed.   Of note, study was technically difficult due to significant movement and electrode artifact. ABNORMALITY - Continuous slow, generalized IMPRESSION: This technically difficult study is suggestive of moderate diffuse encephalopathy, nonspecific etiology. No seizures or epileptiform discharges were seen throughout the recording. If suspicion for interictal activity remains a concern, a repeat or prolonged study can be considered. Priyanka O Yadav   VAS [place] LOWER EXTREMITY VENOUS (DVT)  Result Date: 02/10/2021  Lower Venous DVT Study Indications: Pain, and Swelling.  Risk Factors: None identified. Limitations: Poor ultrasound/tissue interface. Comparison Study: No prior studies. Performing Technologist: Oliver Hum RVT  Examination Guidelines: A complete evaluation includes B-mode imaging, spectral Doppler, color Doppler, and power Doppler as needed of all accessible portions of each vessel.  Bilateral testing is considered an integral part of a complete examination. Limited examinations for reoccurring indications may be performed as noted. The reflux portion of the exam is performed with the patient in reverse Trendelenburg.  +-----+---------------+---------+-----------+----------+--------------+ RIGHTCompressibilityPhasicitySpontaneityPropertiesThrombus Aging +-----+---------------+---------+-----------+----------+--------------+ CFV  Full           Yes      Yes                                 +-----+---------------+---------+-----------+----------+--------------+   +---------+---------------+---------+-----------+----------+--------------+ LEFT     CompressibilityPhasicitySpontaneityPropertiesThrombus Aging +---------+---------------+---------+-----------+----------+--------------+ CFV      Full           Yes      Yes                                 +---------+---------------+---------+-----------+----------+--------------+ SFJ      Full                                                        +---------+---------------+---------+-----------+----------+--------------+ FV Prox  Full                                                        +---------+---------------+---------+-----------+----------+--------------+ FV Mid   Full                                                        +---------+---------------+---------+-----------+----------+--------------+  FV DistalFull                                                        +---------+---------------+---------+-----------+----------+--------------+ PFV      Full                                                        +---------+---------------+---------+-----------+----------+--------------+ POP      Full           Yes      Yes                                 +---------+---------------+---------+-----------+----------+--------------+ PTV      Full                                                         +---------+---------------+---------+-----------+----------+--------------+ PERO     Full                                                        +---------+---------------+---------+-----------+----------+--------------+     Summary: RIGHT: - No evidence of common femoral vein obstruction.  LEFT: - There is no evidence of deep vein thrombosis in the lower extremity.  - No cystic structure found in the popliteal fossa.  *See table(s) above for measurements and observations. Electronically signed by Monica Martinez MD on 02/10/2021 at 3:48:04 PM.    Final      Antimicrobials:   Zosyn day 1, received one day of cefriaxone    Subjective: Still ams, awake but no answering or following questions  Objective: Vitals:   02/26/21 0730 02/26/21 0836 02/26/21 0936 02/26/21 1030  BP: (!) 155/79 (!) 178/75 (!) 157/89 (!) 160/84  Pulse: (!) 117     Resp: (!) 22     Temp:  (!) 100.9 F (38.3 C) 99.7 F (37.6 C) 99.9 F (37.7 C)  TempSrc:  Axillary Axillary Axillary  SpO2: 96% 94%    Weight:      Height:       No intake or output data in the 24 hours ending 02/26/21 1137 Filed Weights   02/25/21 1223  Weight: 72.6 kg    Examination:  General exam: altered  Respiratory system: mild rales, nl RR Cardiovascular system: sinus tach, no murmurs noted Gastrointestinal system: Abdomen is nondistended, soft and nontender. No organomegaly or masses felt. Normal bowel sounds heard. Central nervous system:. No focal neurological deficits. Extremities: WWP, NV INTACT Skin: No rashes, lesions or ulcers Psychiatry: Judgement and insight are impaired     Data Reviewed: I have personally reviewed following labs and imaging studies  CBC: Recent Labs  Lab 02/25/21 1255 02/25/21 1327 02/26/21 0221  WBC 7.1  --  8.6  NEUTROABS 4.7  --   --  HGB 12.7 12.6 12.6  HCT 40.5 37.0 38.8  MCV 98.3  --  96.8  PLT 284  --  665   Basic Metabolic Panel: Recent Labs  Lab  02/25/21 1255 02/25/21 1327 02/26/21 0221  NA 138 139 133*  K 3.9 4.0 3.9  CL 107 106 101  CO2 22  --  22  GLUCOSE 112* 110* 123*  BUN 14 15 8   CREATININE 0.79 0.70 0.84  CALCIUM 9.1  --  8.7*   GFR: Estimated Creatinine Clearance: 47.1 mL/min (by C-G formula based on SCr of 0.84 mg/dL). Liver Function Tests: Recent Labs  Lab 02/25/21 1255  AST 27  ALT 23  ALKPHOS 58  BILITOT 1.1  PROT 6.9  ALBUMIN 4.1   No results for input(s): LIPASE, AMYLASE in the last 168 hours. No results for input(s): AMMONIA in the last 168 hours. Coagulation Profile: Recent Labs  Lab 02/25/21 1255  INR 1.1   Cardiac Enzymes: No results for input(s): CKTOTAL, CKMB, CKMBINDEX, TROPONINI in the last 168 hours. BNP (last 3 results) No results for input(s): PROBNP in the last 8760 hours. HbA1C: Recent Labs    02/26/21 0901  HGBA1C 5.5   CBG: No results for input(s): GLUCAP in the last 168 hours. Lipid Profile: Recent Labs    02/26/21 0901  CHOL 211*  HDL 65  LDLCALC 123*  TRIG 115  CHOLHDL 3.2   Thyroid Function Tests: Recent Labs    02/26/21 0901  TSH 3.055   Anemia Panel: Recent Labs    02/26/21 0901  VITAMINB12 402   Sepsis Labs: No results for input(s): PROCALCITON, LATICACIDVEN in the last 168 hours.  Recent Results (from the past 240 hour(s))  Resp Panel by RT-PCR (Flu A&B, Covid) Nasopharyngeal Swab     Status: None   Collection Time: 02/25/21 12:56 PM   Specimen: Nasopharyngeal Swab; Nasopharyngeal(NP) swabs in vial transport medium  Result Value Ref Range Status   SARS Coronavirus 2 by RT PCR NEGATIVE NEGATIVE Final    Comment: (NOTE) SARS-CoV-2 target nucleic acids are NOT DETECTED.  The SARS-CoV-2 RNA is generally detectable in upper respiratory specimens during the acute phase of infection. The lowest concentration of SARS-CoV-2 viral copies this assay can detect is 138 copies/mL. A negative result does not preclude SARS-Cov-2 infection and should not  be used as the sole basis for treatment or other patient management decisions. A negative result may occur with  improper specimen collection/handling, submission of specimen other than nasopharyngeal swab, presence of viral mutation(s) within the areas targeted by this assay, and inadequate number of viral copies(<138 copies/mL). A negative result must be combined with clinical observations, patient history, and epidemiological information. The expected result is Negative.  Fact Sheet for Patients:  EntrepreneurPulse.com.au  Fact Sheet for Healthcare Providers:  IncredibleEmployment.be  This test is no t yet approved or cleared by the [place] FDA and  has been authorized for detection and/or diagnosis of SARS-CoV-2 by FDA under an Emergency Use Authorization (EUA). This EUA will remain  in effect (meaning this test can be used) for the duration of the COVID-19 declaration under Section 564(b)(1) of the Act, 21 U.S.C.section 360bbb-3(b)(1), unless the authorization is terminated  or revoked sooner.       Influenza A by PCR NEGATIVE NEGATIVE Final   Influenza B by PCR NEGATIVE NEGATIVE Final    Comment: (NOTE) The Xpert Xpress SARS-CoV-2/FLU/RSV plus assay is intended as an aid in the diagnosis of influenza from Nasopharyngeal swab specimens and  should not be used as a sole basis for treatment. Nasal washings and aspirates are unacceptable for Xpert Xpress SARS-CoV-2/FLU/RSV testing.  Fact Sheet for Patients: EntrepreneurPulse.com.au  Fact Sheet for Healthcare Providers: IncredibleEmployment.be  This test is not yet approved or cleared by the [place] FDA and has been authorized for detection and/or diagnosis of SARS-CoV-2 by FDA under an Emergency Use Authorization (EUA). This EUA will remain in effect (meaning this test can be used) for the duration of the COVID-19 declaration under Section  564(b)(1) of the Act, 21 U.S.C. section 360bbb-3(b)(1), unless the authorization is terminated or revoked.  Performed at [facility], [place] [address] [place], [place] [address]          Radiology Studies: CT [facility] HEAD NECK W WO CONTRAST  Result Date: 02/25/2021 CLINICAL DATA:  Initial evaluation for acute stroke. EXAM: CT ANGIOGRAPHY HEAD AND NECK TECHNIQUE: Multidetector CT imaging of the head and neck was performed using the standard protocol during bolus administration of intravenous contrast. Multiplanar CT image reconstructions and MIPs were obtained to evaluate the vascular anatomy. Carotid stenosis measurements (when applicable) are obtained utilizing NASCET criteria, using the distal internal carotid diameter as the denominator. CONTRAST:  72mL OMNIPAQUE IOHEXOL 350 MG/ML SOLN COMPARISON:  Prior head CT from earlier the same day. FINDINGS: CT HEAD FINDINGS Brain: Examination degraded by motion and patient positioning. Age-related cerebral atrophy with chronic small vessel ischemic disease. Small remote left cerebellar infarct noted. No visible acute large vessel territory infarct. No visible intracranial hemorrhage. No mass lesion, midline shift or mass effect. No hydrocephalus or visible extra-axial fluid collection. Vascular: No hyperdense vessel. Scattered vascular calcifications noted within the carotid siphons. Skull: Scalp soft tissues and calvarium within normal limits. Sinuses: Visualized paranasal sinuses are clear. No mastoid effusion. Orbits: Globes and orbital soft tissues within normal limits. Review of the MIP images confirms the above findings CTA NECK FINDINGS Aortic arch: Visualized aortic arch of normal caliber with normal branch pattern. No hemodynamically significant stenosis seen about the origin of the great vessels. Right carotid system: Right common and internal carotid arteries widely patent without stenosis, dissection or occlusion. Left carotid system:  Left CCA patent from its origin to the bifurcation without stenosis. Mild calcified plaque about the left bifurcation without significant stenosis. Left ICA patent distally without stenosis, dissection or occlusion. Vertebral arteries: Both vertebral arteries arise from the subclavian arteries. No proximal subclavian artery stenosis. Both vertebral arteries widely patent without stenosis, dissection or occlusion. Skeleton: No acute osseous finding. No discrete or worrisome osseous lesions. Other neck: No other acute soft tissue abnormality within the neck. No mass or adenopathy. Upper chest: Visualized upper chest demonstrates no acute finding. Calcified granuloma noted within the left lower lobe. Review of the MIP images confirms the above findings CTA HEAD FINDINGS Anterior circulation: Petrous segments patent bilaterally. Atheromatous change within the carotid siphons without hemodynamically significant stenosis. A1 segments widely patent. Right A1 hypoplastic. Normal anterior communicating artery complex. Anterior cerebral arteries patent to their distal aspects without stenosis. No M1 stenosis or occlusion. Normal MCA bifurcations. Distal MCA branches well perfused and symmetric. Posterior circulation: Both V4 segments widely patent to the vertebrobasilar junction without stenosis. Neither PICA origin well visualized. Basilar widely patent to its distal aspect without stenosis. Superior cerebellar arteries patent bilaterally. Both PCAs primarily supplied via the basilar well perfused to their distal aspects. Venous sinuses: Patent. Anatomic variants: None significant.  No aneurysm. Review of the MIP images confirms the above findings IMPRESSION:  CT HEAD IMPRESSION: 1. Motion degraded exam. 2. No definite acute intracranial abnormality. 3. Age-related cerebral atrophy with chronic small vessel ischemic disease. Small remote left cerebellar infarct. CTA HEAD AND NECK IMPRESSION: 1. Negative CTA for large vessel  occlusion. 2. Mild for age atheromatous change about the major arterial vasculature of the head and neck. No hemodynamically significant or correctable stenosis. No other acute vascular abnormality. Electronically Signed   By: Jeannine Boga M.D.   On: 02/25/2021 20:05   DG Shoulder Right  Result Date: 02/25/2021 CLINICAL DATA:  Patient found down today. Right shoulder pain. Initial encounter. EXAM: RIGHT SHOULDER - 2+ VIEW COMPARISON:  None. FINDINGS: No acute bony or joint abnormality is identified. Moderate acromioclavicular osteoarthritis is noted. The glenohumeral joint appears normal. Soft tissues are negative. IMPRESSION: No acute abnormality. Moderate acromioclavicular osteoarthritis. Electronically Signed   By: Inge Rise M.D.   On: 02/25/2021 20:12   CT HEAD WO CONTRAST  Addendum Date: 02/25/2021   ADDENDUM REPORT: 02/25/2021 20:05 ADDENDUM: It has come to our attention that this particular CT unit has developed an artifact today which causes this low-density in this same location of the left hemisphere. In all cases to this point, this has been artifactual and not indicative of true pathology. If there is clinical question, the exam could be repeated, but I believe that there is no acute abnormality affecting the left hemisphere. These results will be called to the ordering clinician or representative by the Radiologist Assistant, and communication documented in the PACS or [employer]. Electronically Signed   By: Nelson Chimes M.D.   On: 02/25/2021 20:05   Result Date: 02/25/2021 CLINICAL DATA:  Seizure. Acute CVA suspected. Neuro deficit. Last normal yesterday. EXAM: CT HEAD WITHOUT CONTRAST TECHNIQUE: Contiguous axial images were obtained from the base of the skull through the vertex without intravenous contrast. COMPARISON:  08/19/2016 MRI brain. 08/20/2016 CT angiogram of the head neck. FINDINGS: Brain: Motion degraded scan, limiting assessment. Loss of gray-white  differentiation throughout left parietal and occipital lobes with associated mild sulcal effacement. No extra-axial collection or parenchymal hemorrhage. No midline shift or mass lesion. Moderate periventricular and subcortical white matter hypodensity. Generalized cerebral volume loss. Generalized ventricular dilatation is not substantially changed and compatible with cerebral volume loss. Vascular: No acute abnormality. Skull: No evidence of calvarial fracture. Sinuses/Orbits: The visualized paranasal sinuses are essentially clear. Other:  The mastoid air cells are unopacified. IMPRESSION: 1. Motion degraded scan, limiting assessment. 2. Loss of gray-white differentiation throughout left parietal and occipital lobes with associated mild sulcal effacement, suspicious for acute left PCA territory infarct. 3. No acute intracranial hemorrhage.  No midline shift. 4. Generalized cerebral volume loss and moderate chronic small vessel ischemic changes in the cerebral white matter. Critical Value/emergent results were called by telephone at the time of interpretation on 02/25/2021 at 2:55 pm to provider [facility], who verbally acknowledged these results. Electronically Signed: By: Ilona Sorrel M.D. On: 02/25/2021 15:43   DG CHEST PORT 1 VIEW  Result Date: 02/26/2021 CLINICAL DATA:  87 year old female with fever.  Seizure. EXAM: PORTABLE CHEST 1 VIEW COMPARISON:  Portable chest 08/19/2016. FINDINGS: Portable AP semi upright view at 0907 hours. Lung volumes and mediastinal contours have not significantly changed. Visualized tracheal air column is within normal limits. Mild increased interstitial markings diffusely compared to 2017. No overt edema. No pneumothorax, pleural effusion or confluent opacity. Mild levoconvex upper thoracic scoliosis. No acute osseous abnormality identified. Cholecystectomy clips in the right upper quadrant with a  paucity of upper abdominal bowel gas. IMPRESSION: 1. Mild diffuse increased  pulmonary interstitial opacity. This might be vascular congestion without overt edema, although viral/atypical respiratory infection is difficult to exclude in the setting of fever. 2. No other acute cardiopulmonary abnormality. Electronically Signed   By: Genevie Ann M.D.   On: 02/26/2021 09:42   EEG adult  Result Date: 02/26/2021 Lora Havens, MD     02/26/2021  7:03 AM Patient Name: RAELA BOHL MRN: [id number] Epilepsy Attending: Lora Havens Referring Physician/Provider: Dr Su Monks Date: 02/26/2021 Duration: 24.02 mins Patient history: 87yo F with seizure like activity. EEG to evaluate for seizure Level of alertness: Awake AEDs during EEG study: LEV Technical aspects: This EEG study was done with scalp electrodes positioned according to the 10-20 International system of electrode placement. Electrical activity was acquired at a sampling rate of 500Hz  and reviewed with a high frequency filter of 70Hz  and a low frequency filter of 1Hz . EEG data were recorded continuously and digitally stored. Description: No posterior dominant rhythm was seen. EEG showed continuous generalized 3 to 6 Hz theta-delta slowing. Hyperventilation and photic stimulation were not performed.   Of note, study was technically difficult due to significant movement and electrode artifact. ABNORMALITY - Continuous slow, generalized IMPRESSION: This technically difficult study is suggestive of moderate diffuse encephalopathy, nonspecific etiology. No seizures or epileptiform discharges were seen throughout the recording. If suspicion for interictal activity remains a concern, a repeat or prolonged study can be considered. Priyanka [name]        Scheduled Meds: . enoxaparin (LOVENOX) injection  40 mg Subcutaneous Q24H   Continuous Infusions: . cefTRIAXone (ROCEPHIN)  IV    . levETIRAcetam Stopped (02/26/21 0656)     LOS: 0 days    Time spent: 61 min    Nicolette Bang, MD [facility]  If  7PM-7AM, please contact night-coverage  02/26/2021, 11:37 AM

## 2021-02-26 NOTE — ED Notes (Signed)
Pt continues to cry  With movement  She still does not know her daughter.  Does not answer any questions asked

## 2021-02-26 NOTE — Progress Notes (Signed)
Called by the RN who wanted to inform rapid response of a red MEWS score.  The patient is febrile, hypertensive, altered mental, and tachycardic.  The patient is resting comfortably in bed and does not appear to be in any acute distress.  The patient does not appear to be seizing currently.  Keppra 2,000 mg ordered for 1815 this evening. The patient is currently being treated for UTI.  Rocephin 1 g is ordered for 1700 02/26/21. Chest xray ordered and PR tylenol ordered from hospitalist.

## 2021-02-26 NOTE — ED Notes (Signed)
The pt has a wet bed  She has been lying on her rt forearm  Iv site and arm red and swollen  Iv came out while attempting to re dress the iv site.  Pt has a wet bed changed diaper placed and pure wick also placed again.  Iv team requested

## 2021-02-26 NOTE — Progress Notes (Signed)
EEG complete - results pending. Patient not able to stay still. Quality of EEG diminished
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
## 2021-02-26 NOTE — Procedures (Signed)
Patient Name: Renee Zuniga  MRN: [id number]  Epilepsy Attending: Lora Havens  Referring Physician/Provider: Dr Su Monks Date: 02/26/2021 Duration: 24.02 mins  Patient history: 87yo F with seizure like activity. EEG to evaluate for seizure  Level of alertness: Awake  AEDs during EEG study: LEV  Technical aspects: This EEG study was done with scalp electrodes positioned according to the 10-20 International system of electrode placement. Electrical activity was acquired at a sampling rate of 500Hz  and reviewed with a high frequency filter of 70Hz  and a low frequency filter of 1Hz . EEG data were recorded continuously and digitally stored.   Description: No posterior dominant rhythm was seen. EEG showed continuous generalized 3 to 6 Hz theta-delta slowing. Hyperventilation and photic stimulation were not performed.     Of note, study was technically difficult due to significant movement and electrode artifact.  ABNORMALITY - Continuous slow, generalized  IMPRESSION: This technically difficult study is suggestive of moderate diffuse encephalopathy, nonspecific etiology. No seizures or epileptiform discharges were seen throughout the recording.  If suspicion for interictal activity remains a concern, a repeat or prolonged study can be considered.    Renee Zuniga [name]

## 2021-02-27 DIAGNOSIS — E7800 Pure hypercholesterolemia, unspecified: Secondary | ICD-10-CM | POA: Diagnosis not present

## 2021-02-27 DIAGNOSIS — I10 Essential (primary) hypertension: Secondary | ICD-10-CM | POA: Diagnosis not present

## 2021-02-27 DIAGNOSIS — E039 Hypothyroidism, unspecified: Secondary | ICD-10-CM | POA: Diagnosis not present

## 2021-02-27 DIAGNOSIS — N390 Urinary tract infection, site not specified: Secondary | ICD-10-CM | POA: Diagnosis not present

## 2021-02-27 NOTE — Plan of Care (Signed)
  Problem: Clinical Measurements: Goal: Respiratory complications will improve Outcome: Progressing  Patient remains on room air.Sats good

## 2021-02-27 NOTE — Progress Notes (Signed)
Neurology Progress Note  87 yo w hx seizures admitted for suspected clinical sz and subsequent AMS in setting of UTI.  S: Per chart review, found to have e coli UTI, now on zosyn. Letahrgic after receiving ativan yesterday evening for agitation.  Patient was not examined today.   Vitals:   02/27/21 0743 02/27/21 1215  BP: 135/70 137/70  Pulse: (!) 104 (!) 108  Resp: 20 18  Temp: 99.2 F (37.3 C) 98.8 F (37.1 C)  SpO2: 94% 93%   Data:  CT HEAD IMPRESSION:  1. Motion degraded exam. 2. No definite acute intracranial abnormality. 3. Age-related cerebral atrophy with chronic small vessel ischemic disease. Small remote left cerebellar infarct.  CTA HEAD AND NECK IMPRESSION:  1. Negative CTA for large vessel occlusion. 2. Mild for age atheromatous change about the major arterial vasculature of the head and neck. No hemodynamically significant or correctable stenosis. No other acute vascular abnormality. CTH abnl favored to be artifactual, regardless will not currently tolerate rescan.  CTA H&N WNL rEEG showed mild diffuse slowing with no epileptiform abnormalities and no seizures She continues on keppra 1500mg  q 12 hrs  CNS imaging personally reviewed  A/P: Favor presentation 2/2 seizure in setting of UTI f/b prolonged post-ictal state and medication effect from yesterday administered for agitation.  - Continue keppra 1500mg  q 12 hrs - Avoid sedating or deliriogenic medications when possible - On zosyn for tx UTI - Will reassess tmrw 3/21 - MRI brain wwo when able to tolerate, otherwise noncon head CT - Consider cEEG if mental status does not improve in 24-48 hrs - Will continue to follow   Su Monks, MD [facility] [phone]  If 7pm- 7am, please page neurology on call as listed in [place].

## 2021-02-27 NOTE — Progress Notes (Signed)
PROGRESS NOTE    Renee Zuniga  MPN:[id number] DOB: 1931/05/12 DOA: 02/25/2021 PCP: Seward Carol, MD   Brief Narrative:  Per admitting HPI: Renee Zuniga a 87 y.o.femalewith medical history significant forseizure disorder, hypertension, hyperlipidemia, history of TIA, hypothyroidism, MCI/dementia who presents to the ED for evaluation of suspected seizure activity. Patient unable to provide any history due to suspected prolonged postictal state and/or medication effect therefore entirety of history is obtained from EDP, chart review, and patient's daughter at bedside.  Daughter states that patient lives alone but has 24/7 care. She was in her usual state of health yesterday. This morning daughter found patient with a glazed over look and incontinent of urine and stool. Daughter felt this was similar to her prior seizure spells. EMS were called and patient was brought to the ED for further evaluation.  Daughter states that patient's PCP did call today to notify that she has a UTI, daughter believes this was reported as E. coli.   Assessment & Plan:   Principal Problem:   Seizure-like activity ([place]) Active Problems:   Hypercholesteremia   HTN (hypertension)   Mild neurocognitive disorder, amnestic type   Hypothyroidism   Seizure disorder ([place])   Renee Zuniga a 87 y.o.femalewith medical history significant forseizure disorder, hypertension, hyperlipidemia, history of TIA, hypothyroidism, MCI whois admitted for seizure activity.  Suspected seizure-like activity with postictal stateinhistory of seizure disorder: Seen by neurology, patient loaded with IV Keppra. Initial CT imaging artifactual. -Loaded IV Keppra 2000 mg followed by 1500 mg twice daily -Seizure precautions -EEG WITHOUT ACUTE SEIZURE ACTIVITY -Continue neurochecks -N.p.o. until mental status improves, confused and min interactive, but much improved from yesterday where patient was incoherent -Further  work-up per neurology when patient is more alert complete given patient's mental status, MRI not yet completed  FEVER: -cxr with edema vs viral -broadened abx for possible aspiration, Zosyn day #2 -checking blood cultures are pending -tx for uti as below  Reported E. coli UTI: Daughter states that patient's PCP notified them today that she has a UTI, reported as E. coli. -changed abx as above -f/up urine culture remains pending  Hypertension: Resume lisinopril when able to take oral medications.  Hypothyroidism: Resume Synthroid when able.  Hyperlipidemia: Resume Zetia when able.  MCI/dementia: Holding rivastigmine for now.  DVT prophylaxis: Lovenox SQ  Code Status: DNR    Code Status Orders  (From admission, onward)         Start     Ordered   02/25/21 2324  Do not attempt resuscitation (DNR)  Continuous       Question Answer Comment  In the event of cardiac or respiratory ARREST Do not call a "code blue"   In the event of cardiac or respiratory ARREST Do not perform Intubation, CPR, defibrillation or ACLS   In the event of cardiac or respiratory ARREST Use medication by any route, position, wound care, and other measures to relive pain and suffering. May use oxygen, suction and manual treatment of airway obstruction as needed for comfort.      02/25/21 2324        Code Status History    Date Active Date Inactive Code Status Order ID Comments User Context   11/26/2018 0434 11/29/2018 1627 Full Code [id number]  Rise Patience, MD ED   08/19/2016 1943 08/20/2016 1620 Full Code [id number]  Florencia Reasons, MD Inpatient   11/09/2014 2353 11/10/2014 1930 Full Code [id number]  Ivor Costa, MD Inpatient   Advance Care Planning  Activity     Family Communication: daughters at bedside  Disposition Plan:    Inpt Remains inpatient appropriate because:, IV treatments appropriate due to intensity of illness or inability to take PO and Inpatient level of care appropriate due to  severity of illness,  Dispo: Patient From: Home Planned Disposition: Home with Health Care Svc medically stable for discharge: No   Consults called: None Admission status: Inpatient   Consultants:   neuro  Procedures:  CT ANGIOGRAM HEAD NECK W WO CONTRAST  Result Date: 02/25/2021 CLINICAL DATA:  Initial evaluation for acute stroke. EXAM: CT ANGIOGRAPHY HEAD AND NECK TECHNIQUE: Multidetector CT imaging of the head and neck was performed using the standard protocol during bolus administration of intravenous contrast. Multiplanar CT image reconstructions and MIPs were obtained to evaluate the vascular anatomy. Carotid stenosis measurements (when applicable) are obtained utilizing NASCET criteria, using the distal internal carotid diameter as the denominator. CONTRAST:  49mL OMNIPAQUE IOHEXOL 350 MG/ML SOLN COMPARISON:  Prior head CT from earlier the same day. FINDINGS: CT HEAD FINDINGS Brain: Examination degraded by motion and patient positioning. Age-related cerebral atrophy with chronic small vessel ischemic disease. Small remote left cerebellar infarct noted. No visible acute large vessel territory infarct. No visible intracranial hemorrhage. No mass lesion, midline shift or mass effect. No hydrocephalus or visible extra-axial fluid collection. Vascular: No hyperdense vessel. Scattered vascular calcifications noted within the carotid siphons. Skull: Scalp soft tissues and calvarium within normal limits. Sinuses: Visualized paranasal sinuses are clear. No mastoid effusion. Orbits: Globes and orbital soft tissues within normal limits. Review of the MIP images confirms the above findings CTA NECK FINDINGS Aortic arch: Visualized aortic arch of normal caliber with normal branch pattern. No hemodynamically significant stenosis seen about the origin of the great vessels. Right carotid system: Right common and internal carotid arteries widely patent without stenosis,  dissection or occlusion. Left carotid system: Left CCA patent from its origin to the bifurcation without stenosis. Mild calcified plaque about the left bifurcation without significant stenosis. Left ICA patent distally without stenosis, dissection or occlusion. Vertebral arteries: Both vertebral arteries arise from the subclavian arteries. No proximal subclavian artery stenosis. Both vertebral arteries widely patent without stenosis, dissection or occlusion. Skeleton: No acute osseous finding. No discrete or worrisome osseous lesions. Other neck: No other acute soft tissue abnormality within the neck. No mass or adenopathy. Upper chest: Visualized upper chest demonstrates no acute finding. Calcified granuloma noted within the left lower lobe. Review of the MIP images confirms the above findings CTA HEAD FINDINGS Anterior circulation: Petrous segments patent bilaterally. Atheromatous change within the carotid siphons without hemodynamically significant stenosis. A1 segments widely patent. Right A1 hypoplastic. Normal anterior communicating artery complex. Anterior cerebral arteries patent to their distal aspects without stenosis. No M1 stenosis or occlusion. Normal MCA bifurcations. Distal MCA branches well perfused and symmetric. Posterior circulation: Both V4 segments widely patent to the vertebrobasilar junction without stenosis. Neither PICA origin well visualized. Basilar widely patent to its distal aspect without stenosis. Superior cerebellar arteries patent bilaterally. Both PCAs primarily supplied via the basilar well perfused to their distal aspects. Venous sinuses: Patent. Anatomic variants: None significant.  No aneurysm. Review of the MIP images confirms the above findings IMPRESSION: CT HEAD IMPRESSION: 1. Motion degraded exam. 2. No definite acute intracranial abnormality. 3. Age-related cerebral atrophy with chronic small vessel ischemic disease. Small remote left cerebellar infarct. CTA HEAD AND NECK  IMPRESSION: 1. Negative CTA for large vessel occlusion. 2. Mild for age atheromatous change about  the major arterial vasculature of the head and neck. No hemodynamically significant or correctable stenosis. No other acute vascular abnormality. Electronically Signed   By: Jeannine Boga M.D.   On: 02/25/2021 20:05   DG Shoulder Right  Result Date: 02/25/2021 CLINICAL DATA:  Patient found down today. Right shoulder pain. Initial encounter. EXAM: RIGHT SHOULDER - 2+ VIEW COMPARISON:  None. FINDINGS: No acute bony or joint abnormality is identified. Moderate acromioclavicular osteoarthritis is noted. The glenohumeral joint appears normal. Soft tissues are negative. IMPRESSION: No acute abnormality. Moderate acromioclavicular osteoarthritis. Electronically Signed   By: Inge Rise M.D.   On: 02/25/2021 20:12   CT HEAD WO CONTRAST  Addendum Date: 02/25/2021   ADDENDUM REPORT: 02/25/2021 20:05 ADDENDUM: It has come to our attention that this particular CT unit has developed an artifact today which causes this low-density in this same location of the left hemisphere. In all cases to this point, this has been artifactual and not indicative of true pathology. If there is clinical question, the exam could be repeated, but I believe that there is no acute abnormality affecting the left hemisphere. These results will be called to the ordering clinician or representative by the Radiologist Assistant, and communication documented in the PACS or [employer]. Electronically Signed   By: Nelson Chimes M.D.   On: 02/25/2021 20:05   Result Date: 02/25/2021 CLINICAL DATA:  Seizure. Acute CVA suspected. Neuro deficit. Last normal yesterday. EXAM: CT HEAD WITHOUT CONTRAST TECHNIQUE: Contiguous axial images were obtained from the base of the skull through the vertex without intravenous contrast. COMPARISON:  08/19/2016 MRI brain. 08/20/2016 CT angiogram of the head neck. FINDINGS: Brain: Motion degraded scan,  limiting assessment. Loss of gray-white differentiation throughout left parietal and occipital lobes with associated mild sulcal effacement. No extra-axial collection or parenchymal hemorrhage. No midline shift or mass lesion. Moderate periventricular and subcortical white matter hypodensity. Generalized cerebral volume loss. Generalized ventricular dilatation is not substantially changed and compatible with cerebral volume loss. Vascular: No acute abnormality. Skull: No evidence of calvarial fracture. Sinuses/Orbits: The visualized paranasal sinuses are essentially clear. Other:  The mastoid air cells are unopacified. IMPRESSION: 1. Motion degraded scan, limiting assessment. 2. Loss of gray-white differentiation throughout left parietal and occipital lobes with associated mild sulcal effacement, suspicious for acute left PCA territory infarct. 3. No acute intracranial hemorrhage.  No midline shift. 4. Generalized cerebral volume loss and moderate chronic small vessel ischemic changes in the cerebral white matter. Critical Value/emergent results were called by telephone at the time of interpretation on 02/25/2021 at 2:55 pm to provider [facility], who verbally acknowledged these results. Electronically Signed: By: Ilona Sorrel M.D. On: 02/25/2021 15:43   DG CHEST PORT 1 VIEW  Result Date: 02/26/2021 CLINICAL DATA:  87 year old female with fever.  Seizure. EXAM: PORTABLE CHEST 1 VIEW COMPARISON:  Portable chest 08/19/2016. FINDINGS: Portable AP semi upright view at 0907 hours. Lung volumes and mediastinal contours have not significantly changed. Visualized tracheal air column is within normal limits. Mild increased interstitial markings diffusely compared to 2017. No overt edema. No pneumothorax, pleural effusion or confluent opacity. Mild levoconvex upper thoracic scoliosis. No acute osseous abnormality identified. Cholecystectomy clips in the right upper quadrant with a paucity of upper abdominal bowel gas.  IMPRESSION: 1. Mild diffuse increased pulmonary interstitial opacity. This might be vascular congestion without overt edema, although viral/atypical respiratory infection is difficult to exclude in the setting of fever. 2. No other acute cardiopulmonary abnormality. Electronically Signed   By: Lemmie Evens  [place] Crane M.D.   On: 02/26/2021 09:42   EEG adult  Result Date: 02/26/2021 Lora Havens, MD     02/26/2021  7:03 AM Patient Name: DAURICE OVANDO MRN: [id number] Epilepsy Attending: Lora Havens Referring Physician/Provider: Dr Su Monks Date: 02/26/2021 Duration: 24.02 mins Patient history: 87yo F with seizure like activity. EEG to evaluate for seizure Level of alertness: Awake AEDs during EEG study: LEV Technical aspects: This EEG study was done with scalp electrodes positioned according to the 10-20 International system of electrode placement. Electrical activity was acquired at a sampling rate of 500Hz  and reviewed with a high frequency filter of 70Hz  and a low frequency filter of 1Hz . EEG data were recorded continuously and digitally stored. Description: No posterior dominant rhythm was seen. EEG showed continuous generalized 3 to 6 Hz theta-delta slowing. Hyperventilation and photic stimulation were not performed.   Of note, study was technically difficult due to significant movement and electrode artifact. ABNORMALITY - Continuous slow, generalized IMPRESSION: This technically difficult study is suggestive of moderate diffuse encephalopathy, nonspecific etiology. No seizures or epileptiform discharges were seen throughout the recording. If suspicion for interictal activity remains a concern, a repeat or prolonged study can be considered. Priyanka O Yadav   VAS [place] LOWER EXTREMITY VENOUS (DVT)  Result Date: 02/10/2021  Lower Venous DVT Study Indications: Pain, and Swelling.  Risk Factors: None identified. Limitations: Poor ultrasound/tissue interface. Comparison Study: No prior studies. Performing  Technologist: Oliver Hum RVT  Examination Guidelines: A complete evaluation includes B-mode imaging, spectral Doppler, color Doppler, and power Doppler as needed of all accessible portions of each vessel. Bilateral testing is considered an integral part of a complete examination. Limited examinations for reoccurring indications may be performed as noted. The reflux portion of the exam is performed with the patient in reverse Trendelenburg.  +-----+---------------+---------+-----------+----------+--------------+ RIGHTCompressibilityPhasicitySpontaneityPropertiesThrombus Aging +-----+---------------+---------+-----------+----------+--------------+ CFV  Full           Yes      Yes                                 +-----+---------------+---------+-----------+----------+--------------+   +---------+---------------+---------+-----------+----------+--------------+ LEFT     CompressibilityPhasicitySpontaneityPropertiesThrombus Aging +---------+---------------+---------+-----------+----------+--------------+ CFV      Full           Yes      Yes                                 +---------+---------------+---------+-----------+----------+--------------+ SFJ      Full                                                        +---------+---------------+---------+-----------+----------+--------------+ FV Prox  Full                                                        +---------+---------------+---------+-----------+----------+--------------+ FV Mid   Full                                                        +---------+---------------+---------+-----------+----------+--------------+  FV DistalFull                                                        +---------+---------------+---------+-----------+----------+--------------+ PFV      Full                                                         +---------+---------------+---------+-----------+----------+--------------+ POP      Full           Yes      Yes                                 +---------+---------------+---------+-----------+----------+--------------+ PTV      Full                                                        +---------+---------------+---------+-----------+----------+--------------+ PERO     Full                                                        +---------+---------------+---------+-----------+----------+--------------+     Summary: RIGHT: - No evidence of common femoral vein obstruction.  LEFT: - There is no evidence of deep vein thrombosis in the lower extremity.  - No cystic structure found in the popliteal fossa.  *See table(s) above for measurements and observations. Electronically signed by Monica Martinez MD on 02/10/2021 at 3:48:04 PM.    Final      Antimicrobials:   Zosyn day 2    Subjective: Patient's mental status still poor but significantly improved from yesterday No longer rambling incoherently Family reported she did recognize them at some point this morning although still remains confused  Objective: Vitals:   02/27/21 0023 02/27/21 0356 02/27/21 0743 02/27/21 1215  BP: 135/67 112/65 135/70 137/70  Pulse: (!) 106 (!) 110 (!) 104 (!) 108  Resp: 19 19 20 18   Temp: 98.9 F (37.2 C) 100.2 F (37.9 C) 99.2 F (37.3 C) 98.8 F (37.1 C)  TempSrc: Oral Oral Axillary Axillary  SpO2: 93% 93% 94% 93%  Weight:      Height:        Intake/Output Summary (Last 24 hours) at 02/27/2021 1259 Last data filed at 02/27/2021 0358 Gross per 24 hour  Intake 147.88 ml  Output 575 ml  Net -427.12 ml   Filed Weights   02/25/21 1223  Weight: 72.6 kg    Examination:  General exam: Somnolent, but arousable Respiratory system: Snoring, limited exam normal respiratory effort no accessory muscle use Cardiovascular system: Sinus tachycardia gastrointestinal system: Abdomen is  nondistended, soft and nontender. No organomegaly or masses felt. Normal bowel sounds heard. Central nervous system: No focal neurological deficits somnolent, confused Extremities: Warm well perfused, Skin: No rashes, lesions or ulcers Psychiatry: Judgement and insight severely  impaired.     Data Reviewed: I have personally reviewed following labs and imaging studies  CBC: Recent Labs  Lab 02/25/21 1255 02/25/21 1327 02/26/21 0221  WBC 7.1  --  8.6  NEUTROABS 4.7  --   --   HGB 12.7 12.6 12.6  HCT 40.5 37.0 38.8  MCV 98.3  --  96.8  PLT 284  --  007   Basic Metabolic Panel: Recent Labs  Lab 02/25/21 1255 02/25/21 1327 02/26/21 0221  NA 138 139 133*  K 3.9 4.0 3.9  CL 107 106 101  CO2 22  --  22  GLUCOSE 112* 110* 123*  BUN 14 15 8   CREATININE 0.79 0.70 0.84  CALCIUM 9.1  --  8.7*   GFR: Estimated Creatinine Clearance: 47.1 mL/min (by C-G formula based on SCr of 0.84 mg/dL). Liver Function Tests: Recent Labs  Lab 02/25/21 1255  AST 27  ALT 23  ALKPHOS 58  BILITOT 1.1  PROT 6.9  ALBUMIN 4.1   No results for input(s): LIPASE, AMYLASE in the last 168 hours. No results for input(s): AMMONIA in the last 168 hours. Coagulation Profile: Recent Labs  Lab 02/25/21 1255  INR 1.1   Cardiac Enzymes: No results for input(s): CKTOTAL, CKMB, CKMBINDEX, TROPONINI in the last 168 hours. BNP (last 3 results) No results for input(s): PROBNP in the last 8760 hours. HbA1C: Recent Labs    02/26/21 0901  HGBA1C 5.5   CBG: No results for input(s): GLUCAP in the last 168 hours. Lipid Profile: Recent Labs    02/26/21 0901  CHOL 211*  HDL 65  LDLCALC 123*  TRIG 115  CHOLHDL 3.2   Thyroid Function Tests: Recent Labs    02/26/21 0901  TSH 3.055   Anemia Panel: Recent Labs    02/26/21 0901  VITAMINB12 402   Sepsis Labs: No results for input(s): PROCALCITON, LATICACIDVEN in the last 168 hours.  Recent Results (from the past 240 hour(s))  Resp Panel by  RT-PCR (Flu A&B, Covid) Nasopharyngeal Swab     Status: None   Collection Time: 02/25/21 12:56 PM   Specimen: Nasopharyngeal Swab; Nasopharyngeal(NP) swabs in vial transport medium  Result Value Ref Range Status   SARS Coronavirus 2 by RT PCR NEGATIVE NEGATIVE Final    Comment: (NOTE) SARS-CoV-2 target nucleic acids are NOT DETECTED.  The SARS-CoV-2 RNA is generally detectable in upper respiratory specimens during the acute phase of infection. The lowest concentration of SARS-CoV-2 viral copies this assay can detect is 138 copies/mL. A negative result does not preclude SARS-Cov-2 infection and should not be used as the sole basis for treatment or other patient management decisions. A negative result may occur with  improper specimen collection/handling, submission of specimen other than nasopharyngeal swab, presence of viral mutation(s) within the areas targeted by this assay, and inadequate number of viral copies(<138 copies/mL). A negative result must be combined with clinical observations, patient history, and epidemiological information. The expected result is Negative.  Fact Sheet for Patients:  EntrepreneurPulse.com.au  Fact Sheet for Healthcare Providers:  IncredibleEmployment.be  This test is no t yet approved or cleared by the [place] FDA and  has been authorized for detection and/or diagnosis of SARS-CoV-2 by FDA under an Emergency Use Authorization (EUA). This EUA will remain  in effect (meaning this test can be used) for the duration of the COVID-19 declaration under Section 564(b)(1) of the Act, 21 U.S.C.section 360bbb-3(b)(1), unless the authorization is terminated  or revoked sooner.  Influenza A by PCR NEGATIVE NEGATIVE Final   Influenza B by PCR NEGATIVE NEGATIVE Final    Comment: (NOTE) The Xpert Xpress SARS-CoV-2/FLU/RSV plus assay is intended as an aid in the diagnosis of influenza from Nasopharyngeal swab  specimens and should not be used as a sole basis for treatment. Nasal washings and aspirates are unacceptable for Xpert Xpress SARS-CoV-2/FLU/RSV testing.  Fact Sheet for Patients: EntrepreneurPulse.com.au  Fact Sheet for Healthcare Providers: IncredibleEmployment.be  This test is not yet approved or cleared by the [place] FDA and has been authorized for detection and/or diagnosis of SARS-CoV-2 by FDA under an Emergency Use Authorization (EUA). This EUA will remain in effect (meaning this test can be used) for the duration of the COVID-19 declaration under Section 564(b)(1) of the Act, 21 U.S.C. section 360bbb-3(b)(1), unless the authorization is terminated or revoked.  Performed at [facility], [place] [address], [place], [place] [address]   Culture, Urine     Status: None (Preliminary result)   Collection Time: 02/25/21 11:07 PM   Specimen: Urine, Random  Result Value Ref Range Status   Specimen Description URINE, RANDOM  Final   Special Requests NONE  Final   Culture   Final    CULTURE REINCUBATED FOR BETTER GROWTH 30,000 COLONIES/mL GRAM NEGATIVE RODS SUSCEPTIBILITIES TO FOLLOW Performed at [facility], [place] [address], [place], [place] [address]    Report Status PENDING  Incomplete         Radiology Studies: CT ANGIOGRAM HEAD NECK W WO CONTRAST  Result Date: 02/25/2021 CLINICAL DATA:  Initial evaluation for acute stroke. EXAM: CT ANGIOGRAPHY HEAD AND NECK TECHNIQUE: Multidetector CT imaging of the head and neck was performed using the standard protocol during bolus administration of intravenous contrast. Multiplanar CT image reconstructions and MIPs were obtained to evaluate the vascular anatomy. Carotid stenosis measurements (when applicable) are obtained utilizing NASCET criteria, using the distal internal carotid diameter as the denominator. CONTRAST:  67mL OMNIPAQUE IOHEXOL 350 MG/ML SOLN COMPARISON:  Prior head  CT from earlier the same day. FINDINGS: CT HEAD FINDINGS Brain: Examination degraded by motion and patient positioning. Age-related cerebral atrophy with chronic small vessel ischemic disease. Small remote left cerebellar infarct noted. No visible acute large vessel territory infarct. No visible intracranial hemorrhage. No mass lesion, midline shift or mass effect. No hydrocephalus or visible extra-axial fluid collection. Vascular: No hyperdense vessel. Scattered vascular calcifications noted within the carotid siphons. Skull: Scalp soft tissues and calvarium within normal limits. Sinuses: Visualized paranasal sinuses are clear. No mastoid effusion. Orbits: Globes and orbital soft tissues within normal limits. Review of the MIP images confirms the above findings CTA NECK FINDINGS Aortic arch: Visualized aortic arch of normal caliber with normal branch pattern. No hemodynamically significant stenosis seen about the origin of the great vessels. Right carotid system: Right common and internal carotid arteries widely patent without stenosis, dissection or occlusion. Left carotid system: Left CCA patent from its origin to the bifurcation without stenosis. Mild calcified plaque about the left bifurcation without significant stenosis. Left ICA patent distally without stenosis, dissection or occlusion. Vertebral arteries: Both vertebral arteries arise from the subclavian arteries. No proximal subclavian artery stenosis. Both vertebral arteries widely patent without stenosis, dissection or occlusion. Skeleton: No acute osseous finding. No discrete or worrisome osseous lesions. Other neck: No other acute soft tissue abnormality within the neck. No mass or adenopathy. Upper chest: Visualized upper chest demonstrates no acute finding. Calcified granuloma noted within the left lower lobe. Review  of the MIP images confirms the above findings CTA HEAD FINDINGS Anterior circulation: Petrous segments patent bilaterally. Atheromatous  change within the carotid siphons without hemodynamically significant stenosis. A1 segments widely patent. Right A1 hypoplastic. Normal anterior communicating artery complex. Anterior cerebral arteries patent to their distal aspects without stenosis. No M1 stenosis or occlusion. Normal MCA bifurcations. Distal MCA branches well perfused and symmetric. Posterior circulation: Both V4 segments widely patent to the vertebrobasilar junction without stenosis. Neither PICA origin well visualized. Basilar widely patent to its distal aspect without stenosis. Superior cerebellar arteries patent bilaterally. Both PCAs primarily supplied via the basilar well perfused to their distal aspects. Venous sinuses: Patent. Anatomic variants: None significant.  No aneurysm. Review of the MIP images confirms the above findings IMPRESSION: CT HEAD IMPRESSION: 1. Motion degraded exam. 2. No definite acute intracranial abnormality. 3. Age-related cerebral atrophy with chronic small vessel ischemic disease. Small remote left cerebellar infarct. CTA HEAD AND NECK IMPRESSION: 1. Negative CTA for large vessel occlusion. 2. Mild for age atheromatous change about the major arterial vasculature of the head and neck. No hemodynamically significant or correctable stenosis. No other acute vascular abnormality. Electronically Signed   By: Jeannine Boga M.D.   On: 02/25/2021 20:05   DG Shoulder Right  Result Date: 02/25/2021 CLINICAL DATA:  Patient found down today. Right shoulder pain. Initial encounter. EXAM: RIGHT SHOULDER - 2+ VIEW COMPARISON:  None. FINDINGS: No acute bony or joint abnormality is identified. Moderate acromioclavicular osteoarthritis is noted. The glenohumeral joint appears normal. Soft tissues are negative. IMPRESSION: No acute abnormality. Moderate acromioclavicular osteoarthritis. Electronically Signed   By: Inge Rise M.D.   On: 02/25/2021 20:12   CT HEAD WO CONTRAST  Addendum Date: 02/25/2021   ADDENDUM  REPORT: 02/25/2021 20:05 ADDENDUM: It has come to our attention that this particular CT unit has developed an artifact today which causes this low-density in this same location of the left hemisphere. In all cases to this point, this has been artifactual and not indicative of true pathology. If there is clinical question, the exam could be repeated, but I believe that there is no acute abnormality affecting the left hemisphere. These results will be called to the ordering clinician or representative by the Radiologist Assistant, and communication documented in the PACS or [employer]. Electronically Signed   By: Nelson Chimes M.D.   On: 02/25/2021 20:05   Result Date: 02/25/2021 CLINICAL DATA:  Seizure. Acute CVA suspected. Neuro deficit. Last normal yesterday. EXAM: CT HEAD WITHOUT CONTRAST TECHNIQUE: Contiguous axial images were obtained from the base of the skull through the vertex without intravenous contrast. COMPARISON:  08/19/2016 MRI brain. 08/20/2016 CT angiogram of the head neck. FINDINGS: Brain: Motion degraded scan, limiting assessment. Loss of gray-white differentiation throughout left parietal and occipital lobes with associated mild sulcal effacement. No extra-axial collection or parenchymal hemorrhage. No midline shift or mass lesion. Moderate periventricular and subcortical white matter hypodensity. Generalized cerebral volume loss. Generalized ventricular dilatation is not substantially changed and compatible with cerebral volume loss. Vascular: No acute abnormality. Skull: No evidence of calvarial fracture. Sinuses/Orbits: The visualized paranasal sinuses are essentially clear. Other:  The mastoid air cells are unopacified. IMPRESSION: 1. Motion degraded scan, limiting assessment. 2. Loss of gray-white differentiation throughout left parietal and occipital lobes with associated mild sulcal effacement, suspicious for acute left PCA territory infarct. 3. No acute intracranial hemorrhage.  No  midline shift. 4. Generalized cerebral volume loss and moderate chronic small vessel ischemic changes in the cerebral white  matter. Critical Value/emergent results were called by telephone at the time of interpretation on 02/25/2021 at 2:55 pm to provider [facility], who verbally acknowledged these results. Electronically Signed: By: Ilona Sorrel M.D. On: 02/25/2021 15:43   DG CHEST PORT 1 VIEW  Result Date: 02/26/2021 CLINICAL DATA:  87 year old female with fever.  Seizure. EXAM: PORTABLE CHEST 1 VIEW COMPARISON:  Portable chest 08/19/2016. FINDINGS: Portable AP semi upright view at 0907 hours. Lung volumes and mediastinal contours have not significantly changed. Visualized tracheal air column is within normal limits. Mild increased interstitial markings diffusely compared to 2017. No overt edema. No pneumothorax, pleural effusion or confluent opacity. Mild levoconvex upper thoracic scoliosis. No acute osseous abnormality identified. Cholecystectomy clips in the right upper quadrant with a paucity of upper abdominal bowel gas. IMPRESSION: 1. Mild diffuse increased pulmonary interstitial opacity. This might be vascular congestion without overt edema, although viral/atypical respiratory infection is difficult to exclude in the setting of fever. 2. No other acute cardiopulmonary abnormality. Electronically Signed   By: Genevie Ann M.D.   On: 02/26/2021 09:42   EEG adult  Result Date: 02/26/2021 Lora Havens, MD     02/26/2021  7:03 AM Patient Name: WINDY DUDEK MRN: [id number] Epilepsy Attending: Lora Havens Referring Physician/Provider: Dr Su Monks Date: 02/26/2021 Duration: 24.02 mins Patient history: 87yo F with seizure like activity. EEG to evaluate for seizure Level of alertness: Awake AEDs during EEG study: LEV Technical aspects: This EEG study was done with scalp electrodes positioned according to the 10-20 International system of electrode placement. Electrical activity was acquired at a  sampling rate of 500Hz  and reviewed with a high frequency filter of 70Hz  and a low frequency filter of 1Hz . EEG data were recorded continuously and digitally stored. Description: No posterior dominant rhythm was seen. EEG showed continuous generalized 3 to 6 Hz theta-delta slowing. Hyperventilation and photic stimulation were not performed.   Of note, study was technically difficult due to significant movement and electrode artifact. ABNORMALITY - Continuous slow, generalized IMPRESSION: This technically difficult study is suggestive of moderate diffuse encephalopathy, nonspecific etiology. No seizures or epileptiform discharges were seen throughout the recording. If suspicion for interictal activity remains a concern, a repeat or prolonged study can be considered. Priyanka [name]        Scheduled Meds: . enoxaparin (LOVENOX) injection  40 mg Subcutaneous Q24H   Continuous Infusions: . sodium chloride 1,000 mL (02/26/21 1305)  . sodium chloride 75 mL/hr at 02/27/21 0721  . levETIRAcetam 1,500 mg (02/27/21 0657)  . piperacillin-tazobactam (ZOSYN)  IV 3.375 g (02/27/21 0500)     LOS: 1 day    Time spent: 84 min    Nicolette Bang, MD [facility]  If 7PM-7AM, please contact night-coverage  02/27/2021, 12:59 PM

## 2021-02-28 DIAGNOSIS — R569 Unspecified convulsions: Secondary | ICD-10-CM | POA: Diagnosis not present

## 2021-02-28 DIAGNOSIS — I10 Essential (primary) hypertension: Secondary | ICD-10-CM | POA: Diagnosis not present

## 2021-02-28 LAB — CBC WITH DIFFERENTIAL/PLATELET
Abs Immature Granulocytes: 0.05 10*3/uL (ref 0.00–0.07)
Basophils Absolute: 0 10*3/uL (ref 0.0–0.1)
Basophils Relative: 1 %
Eosinophils Absolute: 0.1 10*3/uL (ref 0.0–0.5)
Eosinophils Relative: 1 %
HCT: 35.7 % — ABNORMAL LOW (ref 36.0–46.0)
Hemoglobin: 11.5 g/dL — ABNORMAL LOW (ref 12.0–15.0)
Immature Granulocytes: 1 %
Lymphocytes Relative: 13 %
Lymphs Abs: 1.1 10*3/uL (ref 0.7–4.0)
MCH: 31.2 pg (ref 26.0–34.0)
MCHC: 32.2 g/dL (ref 30.0–36.0)
MCV: 96.7 fL (ref 80.0–100.0)
Monocytes Absolute: 0.9 10*3/uL (ref 0.1–1.0)
Monocytes Relative: 10 %
Neutro Abs: 6.4 10*3/uL (ref 1.7–7.7)
Neutrophils Relative %: 74 %
Platelets: 241 10*3/uL (ref 150–400)
RBC: 3.69 MIL/uL — ABNORMAL LOW (ref 3.87–5.11)
RDW: 13.7 % (ref 11.5–15.5)
WBC: 8.5 10*3/uL (ref 4.0–10.5)
nRBC: 0 % (ref 0.0–0.2)

## 2021-02-28 LAB — BASIC METABOLIC PANEL
Anion gap: 11 (ref 5–15)
BUN: 10 mg/dL (ref 8–23)
CO2: 17 mmol/L — ABNORMAL LOW (ref 22–32)
Calcium: 7.8 mg/dL — ABNORMAL LOW (ref 8.9–10.3)
Chloride: 111 mmol/L (ref 98–111)
Creatinine, Ser: 0.8 mg/dL (ref 0.44–1.00)
GFR, Estimated: >60 mL/min (ref >60)
Glucose, Bld: 100 mg/dL — ABNORMAL HIGH (ref 70–99)
Potassium: 3.3 mmol/L — ABNORMAL LOW (ref 3.5–5.1)
Sodium: 139 mmol/L (ref 135–145)

## 2021-02-28 LAB — URINE CULTURE: Culture: 70000 — AB

## 2021-02-28 LAB — C DIFFICILE (CDIFF) QUICK SCRN (NO PCR REFLEX)
C Diff antigen: NEGATIVE
C Diff interpretation: NOT DETECTED
C Diff toxin: NEGATIVE

## 2021-02-28 MED ORDER — SODIUM CHLORIDE 0.9 % IV SOLN
1.5000 g | Freq: Four times a day (QID) | INTRAVENOUS | Status: DC
  Administered 2021-02-28 – 2021-03-03 (×12): 1.5 g via INTRAVENOUS
  Filled 2021-02-28: qty 4
  Filled 2021-02-28: qty 1.5
  Filled 2021-02-28 (×3): qty 4
  Filled 2021-02-28: qty 1.5
  Filled 2021-02-28 (×9): qty 4

## 2021-02-28 MED ORDER — ZINC OXIDE 40 % EX OINT
1.0000 "application " | TOPICAL_OINTMENT | CUTANEOUS | Status: DC | PRN
  Filled 2021-02-28: qty 57

## 2021-02-28 MED ORDER — POTASSIUM CHLORIDE 10 MEQ/100ML IV SOLN
10.0000 meq | INTRAVENOUS | Status: AC
Start: 2021-02-28 — End: 2021-02-28
  Administered 2021-02-28 (×4): 10 meq via INTRAVENOUS
  Filled 2021-02-28 (×4): qty 100

## 2021-02-28 NOTE — Progress Notes (Signed)
Agitated . Irritable and continues to say, "Will you take me home." "Somebody take me home, I just live down the street." Attempts to re-orient unsuccessful. PRN Haldol given per order. See EMAR. Sitter at bedside and she attempt to squeeze sitters hands tight and take off medical devices. She was kicking and attempting to hit sitter while daughter was on the phone with patient. Continuing to attempt to redirect and sitter remains at bedside. Will continue to monitor.

## 2021-02-28 NOTE — Evaluation (Signed)
Physical Therapy Evaluation Patient Details Name: Renee Zuniga MRN: [id number] DOB: Apr 18, 1931 Today's Date: 02/28/2021   History of Present Illness  Pt is a 87 y.o. F admitted 3/18 for evaluation of suspected seizure activity with postictal state. EEG without acute seizure activity. Significant PMH: seizure disorder, HTN, HLD, TIA, MCI/dementia.  Clinical Impression  Prior to admission, pt lives alone, uses a walker for community ambulation and is independent with ADL's. Pt has a 24/7 caregiver who assists with all IADL's. Pt presents with weakness, decreased cognition, balance deficits. Requiring min-mod assist for stand pivot transfers to and from bedside commode. Further ambulation distance or transfer to chair deferred due to frequent bowel incontinence. Recommend SNF in order to maximize functional mobility and decrease caregiver burden.     Follow Up Recommendations SNF    Equipment Recommendations  None recommended by PT    Recommendations for Other Services       Precautions / Restrictions Precautions Precautions: Fall Restrictions Weight Bearing Restrictions: No      Mobility  Bed Mobility Overal bed mobility: Needs Assistance Bed Mobility: Supine to Sit;Sit to Supine     Supine to sit: Min guard Sit to supine: Mod assist   General bed mobility comments: Min guard to progress to edge of bed, modA for BLE negotiation back into bed    Transfers Overall transfer level: Needs assistance Equipment used: Rolling walker (2 wheeled);None Transfers: Sit to/from [facility] Sit to Stand: Min assist;Mod assist Stand pivot transfers: Min assist;Mod assist       General transfer comment: On first attempt, pt able to stand up to walker, but unable to initiate steps. Sat back down on side of bed and performed face to face transfer from bed to BSC, modA overall. With transfer back to bed, pt progressing to minA level using a walker and able to take pivotal  steps from [facility] to bed.  Ambulation/Gait                Stairs            Wheelchair Mobility    Modified Rankin (Stroke Patients Only)       Balance Overall balance assessment: Needs assistance Sitting-balance support: Feet supported Sitting balance-Leahy Scale: Fair     Standing balance support: Bilateral upper extremity supported Standing balance-Leahy Scale: Poor Standing balance comment: reliant on external support                             Pertinent Vitals/Pain Pain Assessment: Faces Faces Pain Scale: No hurt Pain Location: Burning at IV site Pain Descriptors / Indicators: Burning;Grimacing;Guarding Pain Intervention(s): Limited activity within patient's tolerance;Monitored during session;Repositioned    Home Living Family/patient expects to be discharged to:: Skilled nursing facility                 Additional Comments: [facility]    Prior Function Level of Independence: Needs assistance   Gait / Transfers Assistance Needed: Uses RW for community ambulation  ADL's / Homemaking Assistance Needed: Assist for IADL's, bathing intermittently but is able to perform herself  Comments: Has 24/7 caregiver     Hand Dominance        Extremity/Trunk Assessment   Upper Extremity Assessment Upper Extremity Assessment: Defer to OT evaluation    Lower Extremity Assessment Lower Extremity Assessment: Generalized weakness       Communication   Communication: No difficulties  Cognition Arousal/Alertness: Awake/alert Behavior During Therapy:  WFL for tasks assessed/performed Overall Cognitive Status: History of cognitive impairments - at baseline                                 General Comments: History of dementia. Pt smiling, pleasant, following one step commands. Unable to state her birthday; did note some word finding difficulty, but pt family reports it has improved since admission      General Comments       Exercises     Assessment/Plan    PT Assessment Patient needs continued PT services  PT Problem List Decreased strength;Decreased activity tolerance;Decreased balance;Decreased mobility;Decreased cognition;Decreased safety awareness       PT Treatment Interventions DME instruction;Gait training;Functional mobility training;Therapeutic exercise;Therapeutic activities;Balance training;Patient/family education    PT Goals (Current goals can be found in the Care Plan section)  Acute Rehab PT Goals Patient Stated Goal: pt daughters would like her to discharge to rehab PT Goal Formulation: With patient Time For Goal Achievement: 03/14/21 Potential to Achieve Goals: Good    Frequency Min 2X/week   Barriers to discharge        Co-evaluation               AM-PAC PT "6 Clicks" Mobility  Outcome Measure Help needed turning from your back to your side while in a flat bed without using bedrails?: A Little Help needed moving from lying on your back to sitting on the side of a flat bed without using bedrails?: A Little Help needed moving to and from a bed to a chair (including a wheelchair)?: A Lot Help needed standing up from a chair using your arms (e.g., wheelchair or bedside chair)?: A Lot Help needed to walk in hospital room?: A Lot Help needed climbing 3-5 steps with a railing? : Total 6 Click Score: 13    End of Session Equipment Utilized During Treatment: Gait belt Activity Tolerance: Patient tolerated treatment well Patient left: in bed;with call bell/phone within reach;with bed alarm set Nurse Communication: Mobility status PT Visit Diagnosis: Unsteadiness on feet (R26.81);Muscle weakness (generalized) (M62.81);Difficulty in walking, not elsewhere classified (R26.2)    Time: 5248-1859 PT Time Calculation (min) (ACUTE ONLY): 47 min   Charges:   PT Evaluation $PT Eval Moderate Complexity: 1 Mod PT Treatments $Therapeutic Activity: 23-37 mins        Wyona Almas, PT, DPT Acute Rehabilitation Services Pager [phone] Office [phone]   Deno Etienne 02/28/2021, 2:20 PM

## 2021-02-28 NOTE — Progress Notes (Addendum)
Neurology Progress Note Subjective: On exam today patient is less agitated and able to communicate her needs and wants. Patient remains disoriented to place, time, and situation. Daughter remains at bedside. Daughter reports that despite patient's dementia hx patient she normally can answer orientation questions at baseline. Another daughter was on the phone and reports that patient still seems not at her baseline and her recovery has been concerning from prior seizure episodes. RN notes that patient has had 6 episodes of diarrhea in the past 24 h and is concerned for C diff. Patient also passed her Swallow study this AM and received her first meal for lunch while MD was in room. Patient does not endorse pain today, but does report that it is difficult to relax her BLE and is very upset with the IV lines in her system.   Exam: Vitals:   02/28/21 0700 02/28/21 1100  BP: (!) 118/46 (!) 142/63  Pulse: 77 85  Resp: 17 18  Temp: 97.8 F (36.6 C) 97.9 F (36.6 C)  SpO2: 100% 100%   Gen: In bed, NAD Resp: non-labored breathing, no acute distress Extremities: Dry, no lesions Psych: Patient becomes tearful intermittently in the exam and fixates on her IV lines, but is redirectable  Neuro: Ment: Alert to person not time, place, or situation. Patient speech is clear and coherent with recognition and naming normal. Patient is unable to repeat basic phrases but is not able to comprehend certain questions or commands.  II: PERRL__2__mm/brisk. Patient tracks care providers.  III, IV, VI: EOMI, cross midline. Lid elevation symmetric and full.  V: Sensation is intact bilaterally. VII: Face is symmetric resting and smiling.  VIII: Hearing intact to voice IX, X: Palate elevation is symmetric. Phonation normal.  XI: Patient shrug and strength against resistance is symmetric XII: Tongue protrudes midline without fasciculations.   Motor: RUE: 4/5 LUE: 4/5 RLE:4/5 LLE: 4/5 good grip strength. Toes  downgoing. Tone is normal in BUE, but increased in BLE when patient is in bed. Bulk is normal.  Sensation- Intact bilaterally in all extremities to light touch.  Coordination: Patient able to follow commands for RUE (was normal with no dysmetria) but not LUE. Patient does recognize her left hand, but for some reason could not follow the same instructions. No pronator drift. DTRs: 2+ throughout.  Gait- deferred  Pertinent Labs: CBC Latest Ref Rng & Units 02/28/2021 02/26/2021 02/25/2021  WBC 4.0 - 10.5 K/uL 8.5 8.6 -  Hemoglobin 12.0 - 15.0 g/dL 11.5(L) 12.6 12.6  Hematocrit 36.0 - 46.0 % 35.7(L) 38.8 37.0  Platelets 150 - 400 K/uL 241 270 -   CMP     Component Value Date/Time   NA 139 02/28/2021 0456   K 3.3 (L) 02/28/2021 0456   CL 111 02/28/2021 0456   CO2 17 (L) 02/28/2021 0456   GLUCOSE 100 (H) 02/28/2021 0456   BUN 10 02/28/2021 0456   CREATININE 0.80 02/28/2021 0456   CALCIUM 7.8 (L) 02/28/2021 0456   PROT 6.9 02/25/2021 1255   ALBUMIN 4.1 02/25/2021 1255   AST 27 02/25/2021 1255   ALT 23 02/25/2021 1255   ALKPHOS 58 02/25/2021 1255   BILITOT 1.1 02/25/2021 1255   GFRNONAA >60 02/28/2021 0456   GFRAA >60 11/26/2018 0522    Assessment: 87 yo patient who presented w/ AMS and witnessed breakthrough seizures. Patient has not had repeat seizure activity while hospitalized but her mentation has been slow to recover back to baseline. Of note, she was diagnosed with a UTI  for which she began tx this admission.  - Patient has had mild improvement in cognitive function with abx treatment and poor cognitive function is likely 2/2 encephalopathy from UTI; however will get MRI as patient continues to remain disoriented with impairment in ability to understand commands and complex tasks. - Neurological exam also noted some increased tone in BLE.   Recommendations: - Continue Keppra 1500mg  q12 hrs - Avoid sedating or deliriogenic medications when possible - On unasyn for tx UTI (E coli  and E. Faecalis) - MRI brain wo contrast  Damita Dunnings, MD PGY-1  Electronically signed: Dr. Kerney Elbe

## 2021-02-28 NOTE — TOC Initial Note (Signed)
Transition of Care [facility]) - Initial/Assessment Note    Patient Details  Name: Renee Zuniga MRN: [id number] Date of Birth: [birth year]  [facility] CM/SW Contact:    Pollie Friar, RN Phone Number: 02/28/2021, 11:30 AM  Clinical Narrative:                 CM met with the patient and 2 of her daughters at the bedside. They are requesting pt d/c to [facility] SN[facility] when medically ready. They state pt has been there before and they have already spoken to [facility] at [place] and the facility will have a bed for the patient.  CM has requested MD place orders for PT/OT/ST for insurance needs.  CSW to complete FL2 and fax to [facility] per pt and family request.  TOC following.   Expected Discharge Plan: Skilled Nursing Facility Barriers to Discharge: Continued Medical Work up   Patient Goals and CMS Choice   CMS Medicare.gov Compare Post Acute Care list provided to:: Patient Choice offered to / list presented to : [facility]  Expected Discharge Plan and Services Expected Discharge Plan: [place] In-house Referral: Clinical Social Work Discharge Planning Services: CM Consult Post Acute Care Choice: [place] Living arrangements for the past 2 months: [place]                                      Prior Living Arrangements/Services Living arrangements for the past 2 months: Single Family Home Lives with:: Self Patient language and need for interpreter reviewed:: Yes Do you feel safe going back to the place where you live?: Yes      Need for Family Participation in Patient Care: Yes (Comment) Care giver support system in place?: Yes (comment) (pt has caregivers)   Criminal Activity/Legal Involvement Pertinent to Current Situation/Hospitalization: No - Comment as needed  Activities of Daily Living Home Assistive Devices/Equipment: None ADL Screening (condition at time of admission) Patient's cognitive ability  adequate to safely complete daily activities?: No Is the patient deaf or have difficulty hearing?: No Does the patient have difficulty seeing, even when wearing glasses/contacts?: No Does the patient have difficulty concentrating, remembering, or making decisions?: Yes Patient able to express need for assistance with ADLs?: No Does the patient have difficulty dressing or bathing?: Yes Independently performs ADLs?: No Communication: Dependent Is this a change from baseline?: Change from baseline, expected to last >3 days Dressing (OT): Dependent Is this a change from baseline?: Change from baseline, expected to last >3 days Grooming: Dependent Is this a change from baseline?: Change from baseline, expected to last >3 days Feeding: Dependent Is this a change from baseline?: Change from baseline, expected to last >3 days Bathing: Dependent Is this a change from baseline?: Change from baseline, expected to last >3 days Toileting: Dependent Is this a change from baseline?: Change from baseline, expected to last >3days In/Out Bed: Dependent Is this a change from baseline?: Change from baseline, expected to last >3 days Walks in Home: Needs assistance Is this a change from baseline?: Change from baseline, expected to last >3 days Does the patient have difficulty walking or climbing stairs?: Yes Weakness of Legs: Both Weakness of Arms/Hands: Both  Permission Sought/Granted                  Emotional Assessment Appearance:: Appears stated age Attitude/Demeanor/Rapport: Other (comment) Affect (typically observed): Accepting  Orientation: : Oriented to Self   Psych Involvement: No (comment)  Admission diagnosis:  Seizure-like activity ([place]) [R56.9] Cerebrovascular accident (CVA), unspecified mechanism ([place]) [I63.9] Seizure disorder ([place]) [M15.868] Patient Active Problem List   Diagnosis Date Noted  . Seizure disorder ([place]) 02/26/2021  . Seizure-like activity ([place]) 02/25/2021  .  Hypothyroidism 02/25/2021  . Cerebrovascular accident (CVA) ([place])   . Seizure ([place])   . Pelvis fracture ([place]) 11/26/2018  . Mild neurocognitive disorder, amnestic type 08/22/2017  . Aphasia 08/19/2016  . Localization-related idiopathic epilepsy and epileptic syndromes with seizures of localized onset, not intractable, without status epilepticus ([place]) 08/12/2015  . TIA (transient ischemic attack) 11/09/2014  . HTN (hypertension) 11/09/2014  . Bilateral foot pain 03/31/2014  . Pes planus 03/31/2014  . Subluxation of ankle joint 03/31/2014  . Arthritis   . Heart murmur   . Mitral valve prolapse   . Osteoarthritis   . Vitamin D deficiency   . Migraine headache   . Hypercholesteremia   . Tinnitus    PCP:  Seward Carol, MD Pharmacy:   [place], [place] [address] Phone: [phone] Fax: [phone]     Social Determinants of Health (SDOH) Interventions    Readmission Risk Interventions No flowsheet data found.

## 2021-02-28 NOTE — Progress Notes (Signed)
PROGRESS NOTE    Renee Zuniga  ZJI:[id number] DOB: 1931/10/17 DOA: 02/25/2021 PCP: Seward Carol, MD   Brief Narrative:  Per admitting HPI: Renee Zuniga a 87 y.o.femalewith medical history significant forseizure disorder, hypertension, hyperlipidemia, history of TIA, hypothyroidism, MCI/dementia who presents to the ED for evaluation of suspected seizure activity. Patient unable to provide any history due to suspected prolonged postictal state and/or medication effect therefore entirety of history is obtained from EDP, chart review, and patient's daughter at bedside.  Daughter states that patient lives alone but has 24/7 care. She was in her usual state of health yesterday. This morning daughter found patient with a glazed over look and incontinent of urine and stool. Daughter felt this was similar to her prior seizure spells. EMS were called and patient was brought to the ED for further evaluation.  Daughter states that patient's PCP did call today to notify that she has a UTI, daughter believes this was reported as E. coli.  Assessment & Plan:   Principal Problem:   Seizure-like activity ([place]) Active Problems:   Hypercholesteremia   HTN (hypertension)   Mild neurocognitive disorder, amnestic type   Hypothyroidism   Seizure disorder ([place])   Renee Zuniga a 87 y.o.femalewith medical history significant forseizure disorder, hypertension, hyperlipidemia, history of TIA, hypothyroidism, MCI whois admitted for seizure activity.  Suspected seizure-like activity with postictal stateinhistory of seizure disorder: Seen by neurology, patient loaded with IV Keppra. Initial CT imaging artifactual. -Loaded IV Keppra 2000 mg followed by 1500 mg twice daily -Seizure precautions -EEGWITHOUT ACUTE SEIZURE ACTIVITY -Continue neurochecks -N.p.o. until mental status improves, confused and min interactive, but much improved from yesterday where patient was incoherent, SLP eval  today and then plan to advance diet -Further work-up per neurology when patient is morealert complete given patient's mental status, MRI not yet completed  FEVER-Resolved -cxr with edema vs viral -Blood cultures negative -tx for uti as below  E. coli and Enterobacter UTI: Daughter states that patient's PCP notified them today that she has a UTI, reported as E. coli. -ABX to Unasyn starting 3/21  Hypertension: Resume lisinopril when able to take oral medications.  Hypothyroidism: Resume Synthroid when able.  Hyperlipidemia: Resume Zetia when able.  MCI/dementia: Holding rivastigmine for now.   DVT prophylaxis:Lovenox Code Status: DNR Family Communication: Daughter [name] at bedside Disposition Plan:  Status is: Inpatient  Remains inpatient appropriate because:IV treatments appropriate due to intensity of illness or inability to take PO and Inpatient level of care appropriate due to severity of illness   Dispo: The patient is from: Home              Anticipated d/c is to: Home              Patient currently is not medically stable to d/c.   Difficult to place patient No    Consultants:   Neurology  Procedures:   See below  Antimicrobials:  Anti-infectives (From admission, onward)   Start     Dose/Rate Route Frequency Ordered Stop   02/28/21 1200  ampicillin-sulbactam (UNASYN) 1.5 g in sodium chloride 0.9 % 100 mL IVPB        1.5 g 200 mL/hr over 30 Minutes Intravenous Every 6 hours 02/28/21 1053     02/26/21 2100  piperacillin-tazobactam (ZOSYN) IVPB 3.375 g  Status:  Discontinued        3.375 g 12.5 mL/hr over 240 Minutes Intravenous Every 8 hours 02/26/21 1410 02/28/21 1047   02/26/21 1700  cefTRIAXone (ROCEPHIN) 1 g in sodium chloride 0.9 % 100 mL IVPB  Status:  Discontinued        1 g 200 mL/hr over 30 Minutes Intravenous Every 24 hours 02/25/21 2321 02/26/21 1219   02/26/21 1315  piperacillin-tazobactam (ZOSYN) IVPB 3.375 g        3.375 g 100  mL/hr over 30 Minutes Intravenous  Once 02/26/21 1227 02/26/21 1336   02/25/21 1715  cefTRIAXone (ROCEPHIN) 1 g in sodium chloride 0.9 % 100 mL IVPB        1 g 200 mL/hr over 30 Minutes Intravenous  Once 02/25/21 1703 02/25/21 1759       Subjective: Patient seen and evaluated today with no new acute complaints or concerns. No acute concerns or events noted overnight.  She still appears somewhat confused and mumbles incoherently, however she was able to say her daughter's name upon seeing her.  Objective: Vitals:   02/27/21 2000 02/28/21 0025 02/28/21 0420 02/28/21 0700  BP: (!) 151/71 (!) 148/63 (!) 143/63 (!) 118/46  Pulse: 99 95 80 77  Resp: 20 18 20 17   Temp: 99.9 F (37.7 C) 99.2 F (37.3 C) 98.2 F (36.8 C) 97.8 F (36.6 C)  TempSrc: Axillary Axillary Oral Oral  SpO2: 97% 100% 96% 100%  Weight:      Height:        Intake/Output Summary (Last 24 hours) at 02/28/2021 1113 Last data filed at 02/27/2021 1840 Gross per 24 hour  Intake 1525.16 ml  Output 300 ml  Net 1225.16 ml   Filed Weights   02/25/21 1223  Weight: 72.6 kg    Examination:  General exam: Appears calm and comfortable, pleasant and incoherent Respiratory system: Clear to auscultation. Respiratory effort normal. Cardiovascular system: S1 & S2 heard, RRR.  Gastrointestinal system: Abdomen is soft Central nervous system: Alert and awake, confused Extremities: No edema Skin: No significant lesions noted Psychiatry: Flat affect.    Data Reviewed: I have personally reviewed following labs and imaging studies  CBC: Recent Labs  Lab 02/25/21 1255 02/25/21 1327 02/26/21 0221 02/28/21 0456  WBC 7.1  --  8.6 8.5  NEUTROABS 4.7  --   --  6.4  HGB 12.7 12.6 12.6 11.5*  HCT 40.5 37.0 38.8 35.7*  MCV 98.3  --  96.8 96.7  PLT 284  --  270 638   Basic Metabolic Panel: Recent Labs  Lab 02/25/21 1255 02/25/21 1327 02/26/21 0221 02/28/21 0456  NA 138 139 133* 139  K 3.9 4.0 3.9 3.3*  CL 107 106  101 111  CO2 22  --  22 17*  GLUCOSE 112* 110* 123* 100*  BUN 14 15 8 10   CREATININE 0.79 0.70 0.84 0.80  CALCIUM 9.1  --  8.7* 7.8*   GFR: Estimated Creatinine Clearance: 49.4 mL/min (by C-G formula based on SCr of 0.8 mg/dL). Liver Function Tests: Recent Labs  Lab 02/25/21 1255  AST 27  ALT 23  ALKPHOS 58  BILITOT 1.1  PROT 6.9  ALBUMIN 4.1   No results for input(s): LIPASE, AMYLASE in the last 168 hours. No results for input(s): AMMONIA in the last 168 hours. Coagulation Profile: Recent Labs  Lab 02/25/21 1255  INR 1.1   Cardiac Enzymes: No results for input(s): CKTOTAL, CKMB, CKMBINDEX, TROPONINI in the last 168 hours. BNP (last 3 results) No results for input(s): PROBNP in the last 8760 hours. HbA1C: Recent Labs    02/26/21 0901  HGBA1C 5.5   CBG: No results for  input(s): GLUCAP in the last 168 hours. Lipid Profile: Recent Labs    02/26/21 0901  CHOL 211*  HDL 65  LDLCALC 123*  TRIG 115  CHOLHDL 3.2   Thyroid Function Tests: Recent Labs    02/26/21 0901  TSH 3.055   Anemia Panel: Recent Labs    02/26/21 0901  VITAMINB12 402   Sepsis Labs: No results for input(s): PROCALCITON, LATICACIDVEN in the last 168 hours.  Recent Results (from the past 240 hour(s))  Resp Panel by RT-PCR (Flu A&B, Covid) Nasopharyngeal Swab     Status: None   Collection Time: 02/25/21 12:56 PM   Specimen: Nasopharyngeal Swab; Nasopharyngeal(NP) swabs in vial transport medium  Result Value Ref Range Status   SARS Coronavirus 2 by RT PCR NEGATIVE NEGATIVE Final    Comment: (NOTE) SARS-CoV-2 target nucleic acids are NOT DETECTED.  The SARS-CoV-2 RNA is generally detectable in upper respiratory specimens during the acute phase of infection. The lowest concentration of SARS-CoV-2 viral copies this assay can detect is 138 copies/mL. A negative result does not preclude SARS-Cov-2 infection and should not be used as the sole basis for treatment or other patient  management decisions. A negative result may occur with  improper specimen collection/handling, submission of specimen other than nasopharyngeal swab, presence of viral mutation(s) within the areas targeted by this assay, and inadequate number of viral copies(<138 copies/mL). A negative result must be combined with clinical observations, patient history, and epidemiological information. The expected result is Negative.  Fact Sheet for Patients:  EntrepreneurPulse.com.au  Fact Sheet for Healthcare Providers:  IncredibleEmployment.be  This test is no t yet approved or cleared by the [place] FDA and  has been authorized for detection and/or diagnosis of SARS-CoV-2 by FDA under an Emergency Use Authorization (EUA). This EUA will remain  in effect (meaning this test can be used) for the duration of the COVID-19 declaration under Section 564(b)(1) of the Act, 21 U.S.C.section 360bbb-3(b)(1), unless the authorization is terminated  or revoked sooner.       Influenza A by PCR NEGATIVE NEGATIVE Final   Influenza B by PCR NEGATIVE NEGATIVE Final    Comment: (NOTE) The Xpert Xpress SARS-CoV-2/FLU/RSV plus assay is intended as an aid in the diagnosis of influenza from Nasopharyngeal swab specimens and should not be used as a sole basis for treatment. Nasal washings and aspirates are unacceptable for Xpert Xpress SARS-CoV-2/FLU/RSV testing.  Fact Sheet for Patients: EntrepreneurPulse.com.au  Fact Sheet for Healthcare Providers: IncredibleEmployment.be  This test is not yet approved or cleared by the [place] FDA and has been authorized for detection and/or diagnosis of SARS-CoV-2 by FDA under an Emergency Use Authorization (EUA). This EUA will remain in effect (meaning this test can be used) for the duration of the COVID-19 declaration under Section 564(b)(1) of the Act, 21 U.S.C. section 360bbb-3(b)(1),  unless the authorization is terminated or revoked.  Performed at [facility], [place] [address], [place], [place] [address]   Culture, Urine     Status: Abnormal   Collection Time: 02/25/21 11:07 PM   Specimen: Urine, Random  Result Value Ref Range Status   Specimen Description URINE, RANDOM  Final   Special Requests   Final    NONE Performed at [facility], [place] [address], [place], [place] [address]    Culture (A)  Final    70,000 COLONIES/mL ENTEROCOCCUS FAECALIS 30,000 COLONIES/mL ESCHERICHIA COLI    Report Status 02/28/2021 FINAL  Final   Organism ID, Bacteria  ENTEROCOCCUS FAECALIS (A)  Final   Organism ID, Bacteria ESCHERICHIA COLI (A)  Final      Susceptibility   Escherichia coli - MIC*    AMPICILLIN <=2 SENSITIVE Sensitive     CEFAZOLIN <=4 SENSITIVE Sensitive     CEFEPIME <=0.12 SENSITIVE Sensitive     CEFTRIAXONE <=0.25 SENSITIVE Sensitive     CIPROFLOXACIN <=0.25 SENSITIVE Sensitive     GENTAMICIN <=1 SENSITIVE Sensitive     IMIPENEM <=0.25 SENSITIVE Sensitive     NITROFURANTOIN <=16 SENSITIVE Sensitive     TRIMETH/SULFA >=320 RESISTANT Resistant     AMPICILLIN/SULBACTAM <=2 SENSITIVE Sensitive     PIP/TAZO <=4 SENSITIVE Sensitive     * 30,000 COLONIES/mL ESCHERICHIA COLI   Enterococcus faecalis - MIC*    AMPICILLIN <=2 SENSITIVE Sensitive     NITROFURANTOIN <=16 SENSITIVE Sensitive     VANCOMYCIN 1 SENSITIVE Sensitive     * 70,000 COLONIES/mL ENTEROCOCCUS FAECALIS  Culture, blood (routine x 2)     Status: None (Preliminary result)   Collection Time: 02/26/21 12:34 PM   Specimen: BLOOD RIGHT HAND  Result Value Ref Range Status   Specimen Description BLOOD RIGHT HAND  Final   Special Requests   Final    BOTTLES DRAWN AEROBIC ONLY Blood Culture results may not be optimal due to an inadequate volume of blood received in culture bottles   Culture   Final    NO GROWTH 2 DAYS Performed at [facility], [address], [place], [place] [address]     Report Status PENDING  Incomplete  Culture, blood (routine x 2)     Status: None (Preliminary result)   Collection Time: 02/26/21 12:42 PM   Specimen: BLOOD LEFT HAND  Result Value Ref Range Status   Specimen Description BLOOD LEFT HAND  Final   Special Requests   Final    BOTTLES DRAWN AEROBIC ONLY Blood Culture results may not be optimal due to an inadequate volume of blood received in culture bottles   Culture   Final    NO GROWTH 2 DAYS Performed at [facility], [place] [address] [place], [place] [address]    Report Status PENDING  Incomplete         Radiology Studies: No results found.      Scheduled Meds: . enoxaparin (LOVENOX) injection  40 mg Subcutaneous Q24H   Continuous Infusions: . sodium chloride 1,000 mL (02/26/21 1305)  . sodium chloride 75 mL/hr at 02/28/21 0858  . ampicillin-sulbactam (UNASYN) IV    . levETIRAcetam 1,500 mg (02/28/21 0540)  . potassium chloride 10 mEq (02/28/21 1008)     LOS: 2 days    Time spent: 35 minutes    Pratik D Manuella Ghazi, DO [facility]  If 7PM-7AM, please contact night-coverage [email or web address] 02/28/2021, 11:13 AM

## 2021-02-28 NOTE — NC FL2 (Signed)
[place] LEVEL OF CARE SCREENING TOOL     IDENTIFICATION  Patient Name: Renee Zuniga Birthdate: 1931/10/28 Sex: female Admission Date (Current Location): 02/25/2021  [facility] and [place] Number:  Herbalist and Address:  [facility] [place], [place] [address] [place], [place] [address]      Provider Number: [id number]  Attending Physician Name and Address:  Rodena Goldmann, DO  Relative Name and Phone Number:       Current Level of Care: Hospital Recommended Level of Care: [place] Prior Approval Number:    Date Approved/Denied:   PASRR Number: [id number]  Discharge Plan: SNF    Current Diagnoses: Patient Active Problem List   Diagnosis Date Noted  . Seizure disorder ([place]) 02/26/2021  . Seizure-like activity ([place]) 02/25/2021  . Hypothyroidism 02/25/2021  . Cerebrovascular accident (CVA) ([place])   . Seizure ([place])   . Pelvis fracture ([place]) 11/26/2018  . Mild neurocognitive disorder, amnestic type 08/22/2017  . Aphasia 08/19/2016  . Localization-related idiopathic epilepsy and epileptic syndromes with seizures of localized onset, not intractable, without status epilepticus ([place]) 08/12/2015  . TIA (transient ischemic attack) 11/09/2014  . HTN (hypertension) 11/09/2014  . Bilateral foot pain 03/31/2014  . Pes planus 03/31/2014  . Subluxation of ankle joint 03/31/2014  . Arthritis   . Heart murmur   . Mitral valve prolapse   . Osteoarthritis   . Vitamin D deficiency   . Migraine headache   . Hypercholesteremia   . Tinnitus     Orientation RESPIRATION BLADDER Height & Weight     Self  Normal Incontinent Weight: 160 lb (72.6 kg) Height:  5\' 5"  (165.1 cm)  BEHAVIORAL SYMPTOMS/MOOD NEUROLOGICAL BOWEL NUTRITION STATUS    Convulsions/Seizures Incontinent Diet (see DC summary)  AMBULATORY STATUS COMMUNICATION OF NEEDS Skin   Extensive Assist Verbally Normal                       Personal Care Assistance  Level of Assistance  Bathing,Feeding,Dressing Bathing Assistance: Maximum assistance Feeding assistance: Limited assistance Dressing Assistance: Maximum assistance     Functional Limitations Info  Speech     Speech Info: Impaired (expressive aphasia)    SPECIAL CARE FACTORS FREQUENCY  PT (By licensed PT),OT (By licensed OT),Speech therapy     PT Frequency: 5x/wk OT Frequency: 5x/wk     Speech Therapy Frequency: 5x/wk      Contractures Contractures Info: Not present    Additional Factors Info  Code Status,Allergies,Psychotropic Code Status Info: DNR Allergies Info: Ambien (Zolpidem), Demerol (Meperidine), Lunesta (Eszopiclone), Tramadol Psychotropic Info: Lexapro 10mg  daily         Current Medications (02/28/2021):  This is the current hospital active medication list Current Facility-Administered Medications  Medication Dose Route Frequency Provider Last Rate Last Admin  . 0.9 %  sodium chloride infusion   Intravenous PRN Marcell Anger, MD 10 mL/hr at 02/26/21 1305 1,000 mL at 02/26/21 1305  . 0.9 %  sodium chloride infusion   Intravenous Continuous Marcell Anger, MD 75 mL/hr at 02/28/21 0858 New Bag at 02/28/21 0858  . acetaminophen (TYLENOL) tablet 650 mg  650 mg Oral Q4H PRN Lenore Cordia, MD       Or  . acetaminophen (TYLENOL) suppository 650 mg  650 mg Rectal Q4H PRN Lenore Cordia, MD   650 mg at 02/26/21 1114  . ampicillin-sulbactam (UNASYN) 1.5 g in sodium chloride 0.9 % 100 mL IVPB  1.5 g Intravenous Q6H Rolla Flatten, RPH      . enoxaparin (LOVENOX) injection 40 mg  40 mg Subcutaneous Q24H Zada Finders R, MD   40 mg at 02/28/21 0900  . haloperidol lactate (HALDOL) injection 2 mg  2 mg Intravenous Q6H PRN Marcell Anger, MD      . levETIRAcetam (KEPPRA) IVPB 1500 mg/ 100 mL premix  1,500 mg Intravenous Q12H Damita Dunnings B, MD 400 mL/hr at 02/28/21 0540 1,500 mg at 02/28/21 0540  . potassium chloride 10 mEq in 100 mL IVPB   10 mEq Intravenous Q1 Hr x 4 Heath Lark D, DO 100 mL/hr at 02/28/21 1119 10 mEq at 02/28/21 1119     Discharge Medications: Please see discharge summary for a list of discharge medications.  Relevant Imaging Results:  Relevant Lab Results:   Additional Information SS#: [id number]  Geralynn Ochs, LCSW

## 2021-02-28 NOTE — Evaluation (Signed)
Clinical/Bedside Swallow Evaluation Patient Details  Name: Renee Zuniga MRN: [id number] Date of Birth: 03-14-1931  Today's Date: 02/28/2021 Time: SLP Start Time (ACUTE ONLY): 1124 SLP Stop Time (ACUTE ONLY): 1144 SLP Time Calculation (min) (ACUTE ONLY): 20 min  Past Medical History:  Past Medical History:  Diagnosis Date  . Arthritis   . Heart murmur   . High blood pressure   . HTN (hypertension) 11/09/2014  . Hypercholesteremia   . Insomnia   . Localization-related idiopathic epilepsy and epileptic syndromes with seizures of localized onset, not intractable, without status epilepticus ([place]) 08/12/2015  . Migraine headache   . Mild neurocognitive disorder, amnestic type 08/22/2017  . Mitral valve prolapse   . Osteoarthritis   . Osteopenia   . Pelvis fracture ([place]) 11/26/2018  . Seizures ([place])   . TIA (transient ischemic attack) 11/09/2014  . Tinnitus   . Vitamin D deficiency    Past Surgical History:  Past Surgical History:  Procedure Laterality Date  . CHOLECYSTECTOMY    . CORRECTION HAMMER TOE Bilateral   . GALLBLADDER SURGERY    . OOPHORECTOMY     benign tumor, per patient  . PARTIAL HYSTERECTOMY  1974   due to uterine prolaspe   HPI:  Pt is an 87 y.o. female with suspected seizure-like activity, fever, and E. coli and Enterobacter UTI.  CTA 3/18 showed No definite acute intracranial abnormality. CXR 3/19: Mild diffuse increased pulmonary interstitial opacity. PMH: seizure disorder, hypertension, hyperlipidemia, history of TIA, hypothyroidism, MCI/dementia.   Assessment / Plan / Recommendation Clinical Impression  No overt s/s of dysphagia were noted during PO trials today and her daughter reports no difficulty PTA. Her daughter also reports that her diet was not restricted and that she ate whatever she wanted PTA. The pt's mentation appears to be improved but she does have some difficulty making verbalizations. Daughter reported that this has been a consistent change from  her baseline and requested an evaluation to examine this - please consider ordering a Speech-Language Evaluation. Recommend a regular diet with thin liquids. SLP will continue to follow to assess her oropharyngeal swallow with this diet as her mentation continues to clear.   SLP Visit Diagnosis: Dysphagia, unspecified (R13.10)    Aspiration Risk  Mild aspiration risk    Diet Recommendation Thin liquid;Regular   Liquid Administration via: Cup;Straw Medication Administration: Whole meds with puree Supervision: Staff to assist with self feeding;Full supervision/cueing for compensatory strategies Compensations: Minimize environmental distractions;Slow rate;Small sips/bites Postural Changes: Seated upright at 90 degrees    Other  Recommendations Oral Care Recommendations: Oral care BID   Follow up Recommendations Skilled Nursing facility      Frequency and Duration min 2x/week  2 weeks       Prognosis Prognosis for Safe Diet Advancement: Good Barriers to Reach Goals: Cognitive deficits      Swallow Study   General HPI: Pt is an 88 y.o. female with suspected seizure-like activity, fever, and E. coli and Enterobacter UTI.  CTA 3/18 showed No definite acute intracranial abnormality. CXR 3/19: Mild diffuse increased pulmonary interstitial opacity. PMH: seizure disorder, hypertension, hyperlipidemia, history of TIA, hypothyroidism, MCI/dementia. Type of Study: Bedside Swallow Evaluation Previous Swallow Assessment: None in chart Diet Prior to this Study: NPO Temperature Spikes Noted: No Respiratory Status: Room air History of Recent Intubation: No Behavior/Cognition: Alert;Cooperative;Pleasant mood Oral Cavity Assessment: Within Functional Limits Oral Care Completed by SLP: No Oral Cavity - Dentition:  (Unable to visualize) Vision: Functional for self-feeding Self-Feeding Abilities: Needs assist  Patient Positioning: Upright in bed Baseline Vocal Quality: Normal Volitional Cough:  Strong Volitional Swallow: Able to elicit    Oral/Motor/Sensory Function Overall Oral Motor/Sensory Function:  (Unable to assess most due to difficulty following commands; good lip seal and lingual symmetry)   Ice Chips Ice chips: Within functional limits Presentation: Spoon   Thin Liquid Thin Liquid: Within functional limits Presentation: Cup;Self Fed;Straw    Nectar Thick Nectar Thick Liquid: Not tested   Honey Thick Honey Thick Liquid: Not tested   Puree Puree: Within functional limits Presentation: Self Fed;Spoon   Solid     Solid: Within functional limits Presentation: [place]., SLP Student 02/28/2021,2:03 PM

## 2021-02-28 NOTE — Progress Notes (Signed)
Pharmacy Antibiotic Note  Renee Zuniga is a 87 y.o. female admitted on 02/25/2021 with aspiration pneumonia and UTI.  Pharmacy has been consulted to transition Zosyn to Unasyn.   Urine culture is growing Enterococcus and E.coli (both sensitive to Unasyn). Noted concern for aspiration PNA so queried MD on de-escalation of Zosyn to Unasyn to continue coverage for both. MD has agreed. SCr 0.8, CrCl~50 ml/min.   Plan: - Start Unasyn 1.5g IV every 6 hours - Will continue to follow renal function, culture results, LOT, and antibiotic de-escalation plans    Height: 5\' 5"  (165.1 cm) Weight: 72.6 kg (160 lb) IBW/kg (Calculated) : 57  Temp (24hrs), Avg:98.9 F (37.2 C), Min:97.8 F (36.6 C), Max:99.9 F (37.7 C)  Recent Labs  Lab 02/25/21 1255 02/25/21 1327 02/26/21 0221 02/28/21 0456  WBC 7.1  --  8.6 8.5  CREATININE 0.79 0.70 0.84 0.80    Estimated Creatinine Clearance: 49.4 mL/min (by C-G formula based on SCr of 0.8 mg/dL).    Allergies  Allergen Reactions  . Ambien [Zolpidem] Nausea And Vomiting  . Demerol [Meperidine] Nausea And Vomiting       . Lunesta [Eszopiclone] Other (See Comments)    Affected speech  . Tramadol     Zosyn 3/19 >> 3/21 Unasyn 3/21 >>   3/18 Fluvid >> neg 3/19 BCx >> ngx2d 3/18 UCx >> E. Faecalis (pan-S) + E.coli (pan-S except R-bactrim)  Thank you for allowing pharmacy to be a part of this patient's care.  Alycia Rossetti, PharmD, BCPS Clinical Pharmacist Clinical phone for 02/28/2021: [phone] 02/28/2021 10:48 AM   **Pharmacist phone directory can now be found on [email or web address] ([email or web address]).  Listed under [place].

## 2021-02-28 NOTE — Evaluation (Signed)
Occupational Therapy Evaluation Patient Details Name: Renee Zuniga MRN: [id number] DOB: [birth year] Today's Date: 02/28/2021    History of Present Illness Pt is a 87 y.o. F admitted 3/18 for evaluation of suspected seizure activity with postictal state. EEG without acute seizure activity. Significant PMH: seizure disorder, HTN, HLD, TIA, MCI/dementia.   Clinical Impression   PTA patient able to complete self care with supervision, caregiver assists with IADLs, using walker for community mobility. Admitted for above and limited by problem list below, including impaired balance, decreased activity tolerance and impaired cognition.  She is disoriented to place and self (unable to voice birthday, responds "yes" to name Renee Zuniga), follows 1 step commands inconsistently with increased time and requires cueing to attend to task.  She requires up to total assist for toileting, max assist for LB ADls and min-mod assist for UB ADLs; transfers to [facility] with min-mod assist using RW.  She will benefit from further OT services while admitted and after dc at SNF level to optimize return to PLOF. Will follow acutely.      Follow Up Recommendations  SNF;Supervision/Assistance - 24 hour    Equipment Recommendations  Other (comment) (TBD at next venue of care)    Recommendations for Other Services       Precautions / Restrictions Precautions Precautions: Fall Restrictions Weight Bearing Restrictions: No      Mobility Bed Mobility Overal bed mobility: Needs Assistance Bed Mobility: Supine to Sit;Sit to Supine     Supine to sit: Min guard Sit to supine: Mod assist   General bed mobility comments: Min guard to progress to edge of bed, modA for BLE back to supine with increased time    Transfers Overall transfer level: Needs assistance Equipment used: Rolling walker (2 wheeled);None Transfers: Sit to/from [employer] to Stand: Min assist;Mod assist Stand pivot transfers: Min  assist       General transfer comment: patient able to stand with increased time given mod to power up from EOB, pivoting to [facility] with min assist and standing from [facility] with min assist. cueing for safety, easily distracted and poor awareness.    Balance Overall balance assessment: Needs assistance Sitting-balance support: Feet supported Sitting balance-Leahy Scale: Fair     Standing balance support: Bilateral upper extremity supported Standing balance-Leahy Scale: Poor Standing balance comment: reliant on external support                           ADL either performed or assessed with clinical judgement   ADL Overall ADL's : Needs assistance/impaired     Grooming: Minimal assistance;Sitting   Upper Body Bathing: Sitting;Minimal assistance   Lower Body Bathing: Sit to/from stand;Maximal assistance   Upper Body Dressing : Minimal assistance;Sitting   Lower Body Dressing: Maximal assistance;Sit to/from stand   Toilet Transfer: Moderate assistance;Stand-pivot;BSC;RW   Toileting- Clothing Manipulation and Hygiene: Total assistance;Sit to/from stand Toileting - Clothing Manipulation Details (indicate cue type and reason): incontinent of BM     Functional mobility during ADLs: Moderate assistance;Rolling walker General ADL Comments: limited by weakness, balance, cognition     Vision         Perception     Praxis      Pertinent Vitals/Pain Pain Assessment: Faces Faces Pain Scale: No hurt     Hand Dominance     Extremity/Trunk Assessment Upper Extremity Assessment Upper Extremity Assessment: Generalized weakness   Lower Extremity Assessment Lower Extremity Assessment: Defer to PT evaluation  Communication Communication Communication: No difficulties   Cognition Arousal/Alertness: Awake/alert Behavior During Therapy: WFL for tasks assessed/performed Overall Cognitive Status: History of cognitive impairments - at baseline                                  General Comments: hx of dementia, follows 1 step commands but requires redirection. Unable to state her birthday and not oriented to place.   General Comments  daughter present and supportive    Exercises     Shoulder Instructions      Home Living Family/patient expects to be discharged to:: Skilled nursing facility                                 Additional Comments: Renee Zuniga      Prior Functioning/Environment Level of Independence: Needs assistance  Gait / Transfers Assistance Needed: Uses RW for community ambulation ADL's / Homemaking Assistance Needed: Assist for IADL's, bathing intermittently but is able to perform herself   Comments: Has 24/7 caregiver        OT Problem List: Decreased strength;Decreased activity tolerance;Impaired balance (sitting and/or standing);Decreased coordination;Decreased cognition;Decreased safety awareness;Decreased knowledge of use of DME or AE;Decreased knowledge of precautions      OT Treatment/Interventions: Self-care/ADL training;Therapeutic activities;Patient/family education;Balance training    OT Goals(Current goals can be found in the care plan section) Acute Rehab OT Goals Patient Stated Goal: pt daughters would like her to discharge to rehab OT Goal Formulation: With family Time For Goal Achievement: 03/14/21 Potential to Achieve Goals: Good  OT Frequency: Min 2X/week   Barriers to D/C:            Co-evaluation              AM-PAC OT "6 Clicks" Daily Activity     Outcome Measure Help from another person eating meals?: A Little Help from another person taking care of personal grooming?: A Little Help from another person toileting, which includes using toliet, bedpan, or urinal?: Total Help from another person bathing (including washing, rinsing, drying)?: A Lot Help from another person to put on and taking off regular upper body clothing?: A Lot Help from another person to  put on and taking off regular lower body clothing?: A Lot 6 Click Score: 13   End of Session Equipment Utilized During Treatment: Rolling walker Nurse Communication: Mobility status  Activity Tolerance: Patient tolerated treatment well Patient left: in bed;with call bell/phone within reach;with bed alarm set;with family/visitor present  OT Visit Diagnosis: Other abnormalities of gait and mobility (R26.89);Muscle weakness (generalized) (M62.81);Other symptoms and signs involving cognitive function                Time: 1528-1600 OT Time Calculation (min): 32 min Charges:  OT General Charges $OT Visit: 1 Visit OT Evaluation $OT Eval Moderate Complexity: 1 Mod OT Treatments $Self Care/Home Management : 8-22 mins  Jolaine Artist, OT Acute Rehabilitation Services Pager [phone] Office [phone]   Delight Stare 02/28/2021, 4:14 PM

## 2021-03-01 ENCOUNTER — Inpatient Hospital Stay (HOSPITAL_COMMUNITY): Payer: Medicare Other

## 2021-03-01 DIAGNOSIS — I10 Essential (primary) hypertension: Secondary | ICD-10-CM | POA: Diagnosis not present

## 2021-03-01 LAB — BASIC METABOLIC PANEL
Anion gap: 10 (ref 5–15)
BUN: 7 mg/dL — ABNORMAL LOW (ref 8–23)
CO2: 18 mmol/L — ABNORMAL LOW (ref 22–32)
Calcium: 8 mg/dL — ABNORMAL LOW (ref 8.9–10.3)
Chloride: 111 mmol/L (ref 98–111)
Creatinine, Ser: 0.61 mg/dL (ref 0.44–1.00)
GFR, Estimated: >60 mL/min (ref >60)
Glucose, Bld: 99 mg/dL (ref 70–99)
Potassium: 3.4 mmol/L — ABNORMAL LOW (ref 3.5–5.1)
Sodium: 139 mmol/L (ref 135–145)

## 2021-03-01 LAB — CBC
HCT: 31.7 % — ABNORMAL LOW (ref 36.0–46.0)
Hemoglobin: 10.9 g/dL — ABNORMAL LOW (ref 12.0–15.0)
MCH: 32.3 pg (ref 26.0–34.0)
MCHC: 34.4 g/dL (ref 30.0–36.0)
MCV: 94.1 fL (ref 80.0–100.0)
Platelets: 247 10*3/uL (ref 150–400)
RBC: 3.37 MIL/uL — ABNORMAL LOW (ref 3.87–5.11)
RDW: 13.6 % (ref 11.5–15.5)
WBC: 8.4 10*3/uL (ref 4.0–10.5)
nRBC: 0 % (ref 0.0–0.2)

## 2021-03-01 LAB — MAGNESIUM: Magnesium: 1.8 mg/dL (ref 1.7–2.4)

## 2021-03-01 MED ORDER — LOPERAMIDE HCL 2 MG PO CAPS
2.0000 mg | ORAL_CAPSULE | ORAL | Status: DC | PRN
  Administered 2021-03-01 – 2021-03-02 (×2): 2 mg via ORAL
  Filled 2021-03-01 (×2): qty 1

## 2021-03-01 MED ORDER — POTASSIUM CHLORIDE CRYS ER 20 MEQ PO TBCR: 40.0000 meq | EXTENDED_RELEASE_TABLET | Freq: Once | ORAL | Status: DC

## 2021-03-01 MED ORDER — LACTATED RINGERS IV SOLN: INTRAVENOUS | Status: AC

## 2021-03-01 MED ORDER — LEVOTHYROXINE SODIUM 50 MCG PO TABS
50.0000 ug | ORAL_TABLET | Freq: Every day | ORAL | Status: DC
  Administered 2021-03-02 – 2021-03-03 (×2): 50 ug via ORAL
  Filled 2021-03-01 (×2): qty 1

## 2021-03-01 MED ORDER — DIPHENHYDRAMINE HCL 50 MG/ML IJ SOLN
25.0000 mg | Freq: Once | INTRAMUSCULAR | Status: AC
  Administered 2021-03-01: 25 mg via INTRAVENOUS
  Filled 2021-03-01: qty 1

## 2021-03-01 MED ORDER — PENTOXIFYLLINE ER 400 MG PO TBCR
400.0000 mg | EXTENDED_RELEASE_TABLET | Freq: Three times a day (TID) | ORAL | Status: DC
  Administered 2021-03-01 – 2021-03-03 (×7): 400 mg via ORAL
  Filled 2021-03-01 (×8): qty 1

## 2021-03-01 MED ORDER — RIVASTIGMINE TARTRATE 1.5 MG PO CAPS
1.5000 mg | ORAL_CAPSULE | Freq: Two times a day (BID) | ORAL | Status: DC
  Administered 2021-03-01 – 2021-03-03 (×5): 1.5 mg via ORAL
  Filled 2021-03-01 (×6): qty 1

## 2021-03-01 MED ORDER — LISINOPRIL 10 MG PO TABS
10.0000 mg | ORAL_TABLET | Freq: Every day | ORAL | Status: DC
  Administered 2021-03-01 – 2021-03-03 (×3): 10 mg via ORAL
  Filled 2021-03-01 (×3): qty 1

## 2021-03-01 MED ORDER — EZETIMIBE 10 MG PO TABS
10.0000 mg | ORAL_TABLET | Freq: Every day | ORAL | Status: DC
  Administered 2021-03-01 – 2021-03-02 (×2): 10 mg via ORAL
  Filled 2021-03-01 (×2): qty 1

## 2021-03-01 MED ORDER — LORAZEPAM 2 MG/ML IJ SOLN
0.5000 mg | INTRAMUSCULAR | Status: DC | PRN
  Administered 2021-03-01 – 2021-03-03 (×3): 0.5 mg via INTRAVENOUS
  Filled 2021-03-01 (×4): qty 1

## 2021-03-01 MED ORDER — ESCITALOPRAM OXALATE 10 MG PO TABS
10.0000 mg | ORAL_TABLET | Freq: Every day | ORAL | Status: DC
  Administered 2021-03-01 – 2021-03-03 (×3): 10 mg via ORAL
  Filled 2021-03-01 (×3): qty 1

## 2021-03-01 MED ORDER — POTASSIUM CHLORIDE 10 MEQ/100ML IV SOLN
10.0000 meq | INTRAVENOUS | Status: AC
  Administered 2021-03-01 (×4): 10 meq via INTRAVENOUS
  Filled 2021-03-01 (×4): qty 100

## 2021-03-01 MED ORDER — HALOPERIDOL LACTATE 5 MG/ML IJ SOLN
2.0000 mg | Freq: Four times a day (QID) | INTRAMUSCULAR | Status: DC | PRN
  Administered 2021-03-01: 2 mg via INTRAMUSCULAR
  Filled 2021-03-01: qty 1

## 2021-03-01 NOTE — Progress Notes (Signed)
Restlessness continues . Pulling at pure wick, attempts to pull at IV tubing, reaching into the air, pulling blankets off.  Attempts to redirect and reassure unsuccessful. Sitter is at bedside. Continuing to monitor.

## 2021-03-01 NOTE — Progress Notes (Signed)
SLP Cancellation Note  Patient Details Name: Renee Zuniga MRN: [id number] DOB: 1931/11/17   Cancelled treatment:       Reason Eval/Treat Not Completed: Other (comment) RN recommends holding therapy at the moment, as pt just fell asleep after being up and restless throughout the night. Will f/u for ongoing assessment of swallowing as able.     Osie Bond., M.A. [place] Acute Rehabilitation Services Pager [phone] Office [phone]  03/01/2021, 9:37 AM

## 2021-03-01 NOTE — Progress Notes (Signed)
Agitation has not improved with Haldol that was administered per Emar order @ 2134. Restlessness/forgetfulness/agitation increasing and not improving at all. Family sitter at bedside and unable to reassure her or redirect her.  Pinching and squeezing sitters hand very tight  Multiple attempts to console her but unsuccessful.  Contacted hospitalist on call. See new orders. VO to discontinue IV haldol 2mg  and change the route to IM per Dr. Sidney Ace. Also new one time orders for agitation. See EMAR and orders.  Continuing to monitor.

## 2021-03-01 NOTE — Progress Notes (Signed)
  Speech Language Pathology Treatment: Dysphagia  Patient Details Name: Renee Zuniga MRN: [id number] DOB: 09/11/1931 Today's Date: 03/01/2021 Time: 0254-2706 SLP Time Calculation (min) (ACUTE ONLY): 10 min  Assessment / Plan / Recommendation Clinical Impression  No overt s/s of aspiration were noted during PO trials today. Pt and daughter report no subjective symptoms of dysphagia. Recommend continuation of her current diet of regular solids and thin liquids. Pt is afebrile and her lung sounds are good so SLP will sign off at this time - pt and daughter agreed.   Pt's mentation appears to be improving as well as her expressive language; however, if family is still concerned, consider a Speech-Language Evaluation either here or at her next level of care.    HPI HPI: Pt is an 87 y.o. female with suspected seizure-like activity, fever, and E. coli and Enterobacter UTI.  CTA 3/18 showed No definite acute intracranial abnormality. CXR 3/19: Mild diffuse increased pulmonary interstitial opacity. PMH: seizure disorder, hypertension, hyperlipidemia, history of TIA, hypothyroidism, MCI/dementia.      SLP Plan  All goals met       Recommendations  Diet recommendations: Regular;Thin liquid Liquids provided via: Cup;Straw Medication Administration: Whole meds with liquid Supervision: Patient able to self feed;Intermittent supervision to cue for compensatory strategies Compensations: Minimize environmental distractions;Slow rate;Small sips/bites Postural Changes and/or Swallow Maneuvers: Seated upright 90 degrees                Oral Care Recommendations: Oral care BID Follow up Recommendations: 24 hour supervision/assistance SLP Visit Diagnosis: Dysphagia, unspecified (R13.10) Plan: All goals met       GO                Jeanine Luz., SLP Student 03/01/2021, 4:13 PM

## 2021-03-01 NOTE — Progress Notes (Addendum)
PROGRESS NOTE    Renee Zuniga  DUK:[id number] DOB: 11/22/1931 DOA: 02/25/2021 PCP: Seward Carol, MD   Brief Narrative:  Per admitting HPI: Renee Zuniga a 87 y.o.femalewith medical history significant forseizure disorder, hypertension, hyperlipidemia, history of TIA, hypothyroidism, MCI/dementia who presents to the ED for evaluation of suspected seizure activity. Patient unable to provide any history due to suspected prolonged postictal state and/or medication effect therefore entirety of history is obtained from EDP, chart review, and patient's daughter at bedside.  Daughter states that patient lives alone but has 24/7 care. She was in her usual state of health yesterday. This morning daughter found patient with a glazed over look and incontinent of urine and stool. Daughter felt this was similar to her prior seizure spells. EMS were called and patient was brought to the ED for further evaluation.  Daughter states that patient's PCP did call today to notify that she has a UTI, daughter believes this was reported as E. coli.  -Patient was admitted with seizure activity that may be related to her current UTI and aspiration pneumonia.  Neurology following with brain MRI ordered and pending.  Palliative care consulted to assess goals of care.  Assessment & Plan:   Principal Problem:   Seizure-like activity ([place]) Active Problems:   Hypercholesteremia   HTN (hypertension)   Mild neurocognitive disorder, amnestic type   Hypothyroidism   Seizure disorder ([place])   Renee Zuniga a 87 y.o.femalewith medical history significant forseizure disorder, hypertension, hyperlipidemia, history of TIA, hypothyroidism, MCI whois admitted for seizure activity.  Suspected seizure-like activity with postictal stateinhistory of seizure disorder: Seen by neurology, patient loaded with IV Keppra. Initial CT imaging artifactual. -Loaded IV Keppra 2000 mg followed by 1500 mg twice  daily -Seizure precautions -EEGWITHOUT ACUTE SEIZURE ACTIVITY -Continue neurochecks -Started on regular diet after SLP evaluation -Further work-up per neurology when patient is morealertcomplete given patient's mental status, MRI not yet completed and is currently pending -Discussed with family members at bedside today that she may experience a progressive decline as well as further seizure activity on an ongoing basis, family members would like to discuss potential comfort care and hospice.  Palliative care consulted.  FEVER-Resolved -Blood cultures negative x 3 days -tx for uti as below -Treatment for aspiration pneumonia as below  Suspected aspiration pneumonia -Continue on Unasyn as noted below -Anticipate continuation of treatment for 5-7 days, currently day 4  E. coli and Enterobacter UTI: Daughter states that patient's PCP notified them today that she has a UTI, reported as E. coli. -ABX to Unasyn starting 3/21  Diarrhea -Appears resolved, C. difficile on 3/21 negative  Hypertension: Lisinopril resumed  Hypothyroidism: Synthroid resumed  Hyperlipidemia: Zetia resumed  MCI/dementia with some agitation/delirium Rivastigmine resumed -Noted to have some issues with agitation/delirium overnight requiring Haldol Consulted palliative care for goals of care discussion   DVT prophylaxis:Lovenox Code Status: DNR Family Communication: Daughter [name] at bedside 3/22 Disposition Plan:  Status is: Inpatient  Remains inpatient appropriate because:IV treatments appropriate due to intensity of illness or inability to take PO and Inpatient level of care appropriate due to severity of illness   Dispo: The patient is from: Home  Anticipated d/c is to: Home  Patient currently is not medically stable to d/c.              Difficult to place patient No   Consultants:   Neurology  Palliative Care  Procedures:   See  below  Antimicrobials:  Anti-infectives (From admission, onward)  Start     Dose/Rate Route Frequency Ordered Stop   02/28/21 1200  ampicillin-sulbactam (UNASYN) 1.5 g in sodium chloride 0.9 % 100 mL IVPB        1.5 g 200 mL/hr over 30 Minutes Intravenous Every 6 hours 02/28/21 1053     02/26/21 2100  piperacillin-tazobactam (ZOSYN) IVPB 3.375 g  Status:  Discontinued        3.375 g 12.5 mL/hr over 240 Minutes Intravenous Every 8 hours 02/26/21 1410 02/28/21 1047   02/26/21 1700  cefTRIAXone (ROCEPHIN) 1 g in sodium chloride 0.9 % 100 mL IVPB  Status:  Discontinued        1 g 200 mL/hr over 30 Minutes Intravenous Every 24 hours 02/25/21 2321 02/26/21 1219   02/26/21 1315  piperacillin-tazobactam (ZOSYN) IVPB 3.375 g        3.375 g 100 mL/hr over 30 Minutes Intravenous  Once 02/26/21 1227 02/26/21 1336   02/25/21 1715  cefTRIAXone (ROCEPHIN) 1 g in sodium chloride 0.9 % 100 mL IVPB        1 g 200 mL/hr over 30 Minutes Intravenous  Once 02/25/21 1703 02/25/21 1759       Subjective: Patient seen and evaluated today and is currently somnolent after receiving Haldol earlier this morning for agitation overnight.  Daughters are at bedside.  Objective: Vitals:   02/28/21 2300 03/01/21 0415 03/01/21 0700 03/01/21 1100  BP: (!) 157/91 (!) 117/106 (!) 126/55 (!) 141/60  Pulse: 98 94 87 82  Resp: 18 19 18 18   Temp: 97.9 F (36.6 C) 98.2 F (36.8 C) 98.8 F (37.1 C) 99.4 F (37.4 C)  TempSrc: Oral Oral Axillary Axillary  SpO2: 94% 94% 95% 97%  Weight:      Height:        Intake/Output Summary (Last 24 hours) at 03/01/2021 1150 Last data filed at 03/01/2021 0526 Gross per 24 hour  Intake 1837.85 ml  Output 1300 ml  Net 537.85 ml   Filed Weights   02/25/21 1223  Weight: 72.6 kg    Examination:  General exam: Currently somnolent Respiratory system: Clear to auscultation. Respiratory effort normal. Cardiovascular system: S1 & S2 heard, RRR.  Gastrointestinal system: Abdomen  is soft Central nervous system: Somnolent Extremities: No edema Skin: No significant lesions noted Psychiatry: Cannot be assessed    Data Reviewed: I have personally reviewed following labs and imaging studies  CBC: Recent Labs  Lab 02/25/21 1255 02/25/21 1327 02/26/21 0221 02/28/21 0456 03/01/21 0355  WBC 7.1  --  8.6 8.5 8.4  NEUTROABS 4.7  --   --  6.4  --   HGB 12.7 12.6 12.6 11.5* 10.9*  HCT 40.5 37.0 38.8 35.7* 31.7*  MCV 98.3  --  96.8 96.7 94.1  PLT 284  --  270 241 973   Basic Metabolic Panel: Recent Labs  Lab 02/25/21 1255 02/25/21 1327 02/26/21 0221 02/28/21 0456 03/01/21 0355  NA 138 139 133* 139 139  K 3.9 4.0 3.9 3.3* 3.4*  CL 107 106 101 111 111  CO2 22  --  22 17* 18*  GLUCOSE 112* 110* 123* 100* 99  BUN 14 15 8 10  7*  CREATININE 0.79 0.70 0.84 0.80 0.61  CALCIUM 9.1  --  8.7* 7.8* 8.0*  MG  --   --   --   --  1.8   GFR: Estimated Creatinine Clearance: 49.4 mL/min (by C-G formula based on SCr of 0.61 mg/dL). Liver Function Tests: Recent Labs  Lab 02/25/21 1255  AST 27  ALT 23  ALKPHOS 58  BILITOT 1.1  PROT 6.9  ALBUMIN 4.1   No results for input(s): LIPASE, AMYLASE in the last 168 hours. No results for input(s): AMMONIA in the last 168 hours. Coagulation Profile: Recent Labs  Lab 02/25/21 1255  INR 1.1   Cardiac Enzymes: No results for input(s): CKTOTAL, CKMB, CKMBINDEX, TROPONINI in the last 168 hours. BNP (last 3 results) No results for input(s): PROBNP in the last 8760 hours. HbA1C: No results for input(s): HGBA1C in the last 72 hours. CBG: No results for input(s): GLUCAP in the last 168 hours. Lipid Profile: No results for input(s): CHOL, HDL, LDLCALC, TRIG, CHOLHDL, LDLDIRECT in the last 72 hours. Thyroid Function Tests: No results for input(s): TSH, T4TOTAL, FREET4, T3FREE, THYROIDAB in the last 72 hours. Anemia Panel: No results for input(s): VITAMINB12, FOLATE, FERRITIN, TIBC, IRON, RETICCTPCT in the last 72  hours. Sepsis Labs: No results for input(s): PROCALCITON, LATICACIDVEN in the last 168 hours.  Recent Results (from the past 240 hour(s))  Resp Panel by RT-PCR (Flu A&B, Covid) Nasopharyngeal Swab     Status: None   Collection Time: 02/25/21 12:56 PM   Specimen: Nasopharyngeal Swab; Nasopharyngeal(NP) swabs in vial transport medium  Result Value Ref Range Status   SARS Coronavirus 2 by RT PCR NEGATIVE NEGATIVE Final    Comment: (NOTE) SARS-CoV-2 target nucleic acids are NOT DETECTED.  The SARS-CoV-2 RNA is generally detectable in upper respiratory specimens during the acute phase of infection. The lowest concentration of SARS-CoV-2 viral copies this assay can detect is 138 copies/mL. A negative result does not preclude SARS-Cov-2 infection and should not be used as the sole basis for treatment or other patient management decisions. A negative result may occur with  improper specimen collection/handling, submission of specimen other than nasopharyngeal swab, presence of viral mutation(s) within the areas targeted by this assay, and inadequate number of viral copies(<138 copies/mL). A negative result must be combined with clinical observations, patient history, and epidemiological information. The expected result is Negative.  Fact Sheet for Patients:  EntrepreneurPulse.com.au  Fact Sheet for Healthcare Providers:  IncredibleEmployment.be  This test is no t yet approved or cleared by the [place] FDA and  has been authorized for detection and/or diagnosis of SARS-CoV-2 by FDA under an Emergency Use Authorization (EUA). This EUA will remain  in effect (meaning this test can be used) for the duration of the COVID-19 declaration under Section 564(b)(1) of the Act, 21 U.S.C.section 360bbb-3(b)(1), unless the authorization is terminated  or revoked sooner.       Influenza A by PCR NEGATIVE NEGATIVE Final   Influenza B by PCR NEGATIVE  NEGATIVE Final    Comment: (NOTE) The Xpert Xpress SARS-CoV-2/FLU/RSV plus assay is intended as an aid in the diagnosis of influenza from Nasopharyngeal swab specimens and should not be used as a sole basis for treatment. Nasal washings and aspirates are unacceptable for Xpert Xpress SARS-CoV-2/FLU/RSV testing.  Fact Sheet for Patients: EntrepreneurPulse.com.au  Fact Sheet for Healthcare Providers: IncredibleEmployment.be  This test is not yet approved or cleared by the [place] FDA and has been authorized for detection and/or diagnosis of SARS-CoV-2 by FDA under an Emergency Use Authorization (EUA). This EUA will remain in effect (meaning this test can be used) for the duration of the COVID-19 declaration under Section 564(b)(1) of the Act, 21 U.S.C. section 360bbb-3(b)(1), unless the authorization is terminated or revoked.  Performed at [facility], [place] [address] [place], [place]  [address]   Culture, Urine     Status: Abnormal   Collection Time: 02/25/21 11:07 PM   Specimen: Urine, Random  Result Value Ref Range Status   Specimen Description URINE, RANDOM  Final   Special Requests   Final    NONE Performed at [facility], [place] [address], [place], [place] [address]    Culture (A)  Final    70,000 COLONIES/mL ENTEROCOCCUS FAECALIS 30,000 COLONIES/mL ESCHERICHIA COLI    Report Status 02/28/2021 FINAL  Final   Organism ID, Bacteria ENTEROCOCCUS FAECALIS (A)  Final   Organism ID, Bacteria ESCHERICHIA COLI (A)  Final      Susceptibility   Escherichia coli - MIC*    AMPICILLIN <=2 SENSITIVE Sensitive     CEFAZOLIN <=4 SENSITIVE Sensitive     CEFEPIME <=0.12 SENSITIVE Sensitive     CEFTRIAXONE <=0.25 SENSITIVE Sensitive     CIPROFLOXACIN <=0.25 SENSITIVE Sensitive     GENTAMICIN <=1 SENSITIVE Sensitive     IMIPENEM <=0.25 SENSITIVE Sensitive     NITROFURANTOIN <=16 SENSITIVE Sensitive     TRIMETH/SULFA >=320  RESISTANT Resistant     AMPICILLIN/SULBACTAM <=2 SENSITIVE Sensitive     PIP/TAZO <=4 SENSITIVE Sensitive     * 30,000 COLONIES/mL ESCHERICHIA COLI   Enterococcus faecalis - MIC*    AMPICILLIN <=2 SENSITIVE Sensitive     NITROFURANTOIN <=16 SENSITIVE Sensitive     VANCOMYCIN 1 SENSITIVE Sensitive     * 70,000 COLONIES/mL ENTEROCOCCUS FAECALIS  Culture, blood (routine x 2)     Status: None (Preliminary result)   Collection Time: 02/26/21 12:34 PM   Specimen: BLOOD RIGHT HAND  Result Value Ref Range Status   Specimen Description BLOOD RIGHT HAND  Final   Special Requests   Final    BOTTLES DRAWN AEROBIC ONLY Blood Culture results may not be optimal due to an inadequate volume of blood received in culture bottles   Culture   Final    NO GROWTH 3 DAYS Performed at [facility], [address] [place], [place] [address]    Report Status PENDING  Incomplete  Culture, blood (routine x 2)     Status: None (Preliminary result)   Collection Time: 02/26/21 12:42 PM   Specimen: BLOOD LEFT HAND  Result Value Ref Range Status   Specimen Description BLOOD LEFT HAND  Final   Special Requests   Final    BOTTLES DRAWN AEROBIC ONLY Blood Culture results may not be optimal due to an inadequate volume of blood received in culture bottles   Culture   Final    NO GROWTH 3 DAYS Performed at [facility], [place] [address], [place], [place] [address]    Report Status PENDING  Incomplete  C Difficile Quick Screen (NO PCR Reflex)     Status: None   Collection Time: 02/28/21  1:42 PM   Specimen: STOOL  Result Value Ref Range Status   C Diff antigen NEGATIVE NEGATIVE Final   C Diff toxin NEGATIVE NEGATIVE Final   C Diff interpretation No C. difficile detected.  Final    Comment: Performed at [facility], [place] [address] [place], [place] [address]         Radiology Studies: No results found.      Scheduled Meds: . enoxaparin (LOVENOX) injection  40 mg Subcutaneous Q24H  .  escitalopram  10 mg Oral Daily  . ezetimibe  10 mg Oral QHS  . [START ON 03/02/2021] levothyroxine  50 mcg Oral QAC breakfast  . lisinopril  10 mg Oral Daily  . pentoxifylline  400 mg Oral TID WC  . rivastigmine  1.5 mg Oral BID   Continuous Infusions: . sodium chloride 1,000 mL (02/26/21 1305)  . ampicillin-sulbactam (UNASYN) IV 1.5 g (03/01/21 0526)  . lactated ringers    . levETIRAcetam 1,500 mg (03/01/21 0636)  . potassium chloride 10 mEq (03/01/21 1053)     LOS: 3 days    Time spent: 35 minutes    Rozalia Dino D Manuella Ghazi, DO [facility]  If 7PM-7AM, please contact night-coverage [email or web address] 03/01/2021, 11:50 AM

## 2021-03-02 DIAGNOSIS — I10 Essential (primary) hypertension: Secondary | ICD-10-CM | POA: Diagnosis not present

## 2021-03-02 DIAGNOSIS — N8110 Cystocele, unspecified: Secondary | ICD-10-CM

## 2021-03-02 DIAGNOSIS — R569 Unspecified convulsions: Secondary | ICD-10-CM | POA: Diagnosis not present

## 2021-03-02 DIAGNOSIS — Z515 Encounter for palliative care: Secondary | ICD-10-CM | POA: Diagnosis not present

## 2021-03-02 LAB — BASIC METABOLIC PANEL
Anion gap: 7 (ref 5–15)
BUN: 5 mg/dL — ABNORMAL LOW (ref 8–23)
CO2: 21 mmol/L — ABNORMAL LOW (ref 22–32)
Calcium: 8.2 mg/dL — ABNORMAL LOW (ref 8.9–10.3)
Chloride: 107 mmol/L (ref 98–111)
Creatinine, Ser: 0.61 mg/dL (ref 0.44–1.00)
GFR, Estimated: >60 mL/min (ref >60)
Glucose, Bld: 112 mg/dL — ABNORMAL HIGH (ref 70–99)
Potassium: 3.5 mmol/L (ref 3.5–5.1)
Sodium: 135 mmol/L (ref 135–145)

## 2021-03-02 LAB — CBC
HCT: 31.3 % — ABNORMAL LOW (ref 36.0–46.0)
Hemoglobin: 10.7 g/dL — ABNORMAL LOW (ref 12.0–15.0)
MCH: 31.8 pg (ref 26.0–34.0)
MCHC: 34.2 g/dL (ref 30.0–36.0)
MCV: 93.2 fL (ref 80.0–100.0)
Platelets: 260 10*3/uL (ref 150–400)
RBC: 3.36 MIL/uL — ABNORMAL LOW (ref 3.87–5.11)
RDW: 13.3 % (ref 11.5–15.5)
WBC: 7.7 10*3/uL (ref 4.0–10.5)
nRBC: 0 % (ref 0.0–0.2)

## 2021-03-02 LAB — MAGNESIUM
Magnesium: 1.7 mg/dL (ref 1.7–2.4)
Magnesium: 1.7 mg/dL (ref 1.7–2.4)

## 2021-03-02 MED ORDER — POTASSIUM CHLORIDE CRYS ER 20 MEQ PO TBCR
40.0000 meq | EXTENDED_RELEASE_TABLET | Freq: Two times a day (BID) | ORAL | Status: AC
  Administered 2021-03-02 (×2): 40 meq via ORAL
  Filled 2021-03-02 (×2): qty 2

## 2021-03-02 MED ORDER — OLANZAPINE 2.5 MG PO TABS: 2.5000 mg | ORAL_TABLET | Freq: Every day | ORAL | Status: DC | PRN

## 2021-03-02 MED ORDER — MAGNESIUM SULFATE 2 GM/50ML IV SOLN
2.0000 g | Freq: Once | INTRAVENOUS | Status: AC
  Administered 2021-03-02: 2 g via INTRAVENOUS
  Filled 2021-03-02: qty 50

## 2021-03-02 MED ORDER — PSYLLIUM 95 % PO PACK
1.0000 | PACK | Freq: Every day | ORAL | Status: DC
  Administered 2021-03-02: 1 via ORAL
  Filled 2021-03-02 (×2): qty 1

## 2021-03-02 MED ORDER — HYDROCORT-PRAMOXINE (PERIANAL) 1-1 % EX FOAM
1.0000 | Freq: Two times a day (BID) | CUTANEOUS | Status: DC
  Administered 2021-03-02 (×2): 1 via RECTAL
  Filled 2021-03-02: qty 10

## 2021-03-02 MED ORDER — OLANZAPINE 2.5 MG PO TABS
2.5000 mg | ORAL_TABLET | Freq: Every day | ORAL | Status: DC
  Administered 2021-03-02: 2.5 mg via ORAL
  Filled 2021-03-02: qty 1

## 2021-03-02 MED ORDER — FERROUS SULFATE 325 (65 FE) MG PO TABS
325.0000 mg | ORAL_TABLET | Freq: Three times a day (TID) | ORAL | Status: DC
  Administered 2021-03-02 – 2021-03-03 (×4): 325 mg via ORAL
  Filled 2021-03-02 (×4): qty 1

## 2021-03-02 NOTE — Progress Notes (Signed)
Physical Therapy Treatment Patient Details Name: Renee Zuniga MRN: [id number] DOB: 09/05/1931 Today's Date: 03/02/2021    History of Present Illness Pt is a 87 y.o. F admitted 3/18 for evaluation of suspected seizure activity with postictal state. EEG without acute seizure activity. Significant PMH: seizure disorder, HTN, HLD, TIA, MCI/dementia.    PT Comments    Pt making good progress today.  She initially had increase in HR to 138 bpm but possibly related to anxiety getting to [facility] or daughter reports she sometimes gets nervous not knowing what is going on. HR improved after using BSC and able to proceed with ambulation.  Continues to require min-mod A but did ambulate in room today.  Continue to recommend skilled therapy .     Follow Up Recommendations  SNF     Equipment Recommendations  None recommended by PT    Recommendations for Other Services       Precautions / Restrictions Precautions Precautions: Fall    Mobility  Bed Mobility Overal bed mobility: Needs Assistance Bed Mobility: Supine to Sit     Supine to sit: Mod assist     General bed mobility comments: Increased time, cues for sequencing.  Min A for legs to initiate and mod A to lift trunk; cues for weight shifting to scoot    Transfers Overall transfer level: Needs assistance Equipment used: Rolling walker (2 wheeled);1 person hand held assist Transfers: Sit to/from [facility] Sit to Stand: Min assist;Mod assist Stand pivot transfers: Min assist       General transfer comment: Initial sit to stand and pivot to [facility] with mod A due to pt in a hurry to get to [facility].  Performed sit to stand 5 more times with tactile cues for hand placement and min A to rise.  Cues for RW use.  Ambulation/Gait Ambulation/Gait assistance: Min assist Gait Distance (Feet): 35 Feet (15' then 75') Assistive device: Rolling walker (2 wheeled) Gait Pattern/deviations: Step-to pattern;Decreased stride  length;Shuffle Gait velocity: decreased   General Gait Details: Ambulated 44' then took rest break and then ambulated 35 ' in room.  Tendency to shuffle and posterior lean.  Provided cues for RW proximity and assist to steady   Stairs             Wheelchair Mobility    Modified Rankin (Stroke Patients Only)       Balance Overall balance assessment: Needs assistance Sitting-balance support: Feet supported Sitting balance-Leahy Scale: Good     Standing balance support: Bilateral upper extremity supported Standing balance-Leahy Scale: Poor Standing balance comment: requiring min A ; tendency to posterior lean                            Cognition Arousal/Alertness: Awake/alert Behavior During Therapy: WFL for tasks assessed/performed Overall Cognitive Status: Impaired/Different from baseline Area of Impairment: Problem solving;Awareness;Orientation;Memory                 Orientation Level: Disoriented to;Time;Situation   Memory: Decreased short-term memory     Awareness: Emergent Problem Solving: Slow processing;Decreased initiation;Difficulty sequencing;Requires tactile cues;Requires verbal cues General Comments: Hx of dementia but does not seem at baseline per report      Exercises      General Comments General comments (skin integrity, edema, etc.): Daughter present and supportive.  Pt had some c/o dizziness once up on BSC - BP was 135/90.  Noted on BSC pt's HR was 130-138 bpm (had  been 90's).  Daughter reports sometimes pt gets anxious and reiterated that therapist are here just to help her walk.  Assisted pt with ADLs and return to sitting EOB.  HR then 90's to 105 bpm rest so continued with therapy and ambulation.  HR increasing to 125 bpm max with ambulation with quick recovery.      Pertinent Vitals/Pain Pain Assessment: No/denies pain    Home Living                      Prior Function            PT Goals (current goals  can now be found in the care plan section) Acute Rehab PT Goals Patient Stated Goal: pt daughters would like her to discharge to rehab PT Goal Formulation: With patient Time For Goal Achievement: 03/14/21 Potential to Achieve Goals: Good Progress towards PT goals: Progressing toward goals    Frequency    Min 2X/week      PT Plan Current plan remains appropriate    Co-evaluation              AM-PAC PT "6 Clicks" Mobility   Outcome Measure  Help needed turning from your back to your side while in a flat bed without using bedrails?: A Little Help needed moving from lying on your back to sitting on the side of a flat bed without using bedrails?: A Little Help needed moving to and from a bed to a chair (including a wheelchair)?: A Little Help needed standing up from a chair using your arms (e.g., wheelchair or bedside chair)?: A Little Help needed to walk in hospital room?: A Lot Help needed climbing 3-5 steps with a railing? : A Lot 6 Click Score: 16    End of Session Equipment Utilized During Treatment: Gait belt Activity Tolerance: Patient tolerated treatment well Patient left: with call bell/phone within reach;with chair alarm set;in chair;with family/visitor present Nurse Communication: Mobility status PT Visit Diagnosis: Unsteadiness on feet (R26.81);Muscle weakness (generalized) (M62.81);Difficulty in walking, not elsewhere classified (R26.2)     Time: 4235-3614 PT Time Calculation (min) (ACUTE ONLY): 42 min  Charges:  $Gait Training: 8-22 mins $Therapeutic Activity: 23-37 mins                     Abran Richard, PT Acute Rehab Services Pager [phone] [facility] [place] 03/02/2021, 5:25 PM

## 2021-03-02 NOTE — Progress Notes (Addendum)
Neurology Progress Note Subjective: On exam today patient is resting, but arouses to verbal stimuli. Patient's other daughter Lainee Lehrman is in the room today. Patient is pleasant this AM and reports that she is doing well. Jesica Goheen reports that patient has started asking about discharge. Patient is not aware of time or situation but is aware that she is in the hospital and her name. Daughter feels that patient continues to make some improvements, but is concerned that patient will no longer be able to live at home with her aid and reports that the family is beginning discussion to figure out patients future after she is discharged from the SNF she will be going to from the hospital.  Exam: Vitals:   03/02/21 0425 03/02/21 0751  BP: (!) 166/88 (!) 157/71  Pulse: 96 100  Resp: 18 18  Temp: 97.6 F (36.4 C) 98.2 F (36.8 C)  SpO2: 95% 94%   Gen: In bed, NAD Resp: non-labored breathing, no acute distress CV: Palpable pulses Extremities: Well perfused Skin: No lesions, rashes or new brusing  Ment: Alert to person and place not time or situation. Patient speech is clear and coherent with recognition and naming intact. Patient comprehension has improved and patient is able to follow commands today that she could not at last physical exam.  II: PERRL__2__mm/brisk. III, IV, VI: EOMI, cross midline. Lid elevation symmetric and full.  V: Sensation is intact bilaterally. VII: Face is symmetric resting and smiling.  VIII: Hearing intact to voice IX, X: Palate elevation is symmetric. Phonation normal.  TK:WIOXBDZ shrug and strength against resistance is symmetric XII: Tongue protrudes midline without fasciculations.  Motor:RUE: 4/5 LUE: 4/5 RLE:3-/5 LLE: 3-/5 w/ good grip strength. Toes downgoing. Tone is normal in all extremities. Bulk is normal.  Sensation- Intact bilaterally in all extremities to light touch.  Coordination: Finger to nose is intact. No pronator drift. DTRs: 2+ throughout.   Gait- deferred  Pertinent Labs and Imaging: MR BRAIN WO CONTRAST  Result Date: 03/01/2021 CLINICAL DATA:  Possible stroke EXAM: MRI HEAD WITHOUT CONTRAST TECHNIQUE: Multiplanar, multiecho pulse sequences of the brain and surrounding structures were obtained without intravenous contrast. COMPARISON:  2017, recent CT imaging FINDINGS: Brain: There is no acute infarction or intracranial hemorrhage. There is no intracranial mass, mass effect, or edema. There is no hydrocephalus or extra-axial fluid collection. Patchy T2 hyperintensity in the supratentorial white matter is nonspecific but likely reflects mild to moderate chronic microvascular ischemic changes. Small chronic bilateral cerebellar infarcts. Prominence of the ventricles and sulci reflects generalized parenchymal volume loss. Vascular: Major vessel flow voids at the skull base are preserved. Skull and upper cervical spine: Normal marrow signal is preserved. Sinuses/Orbits: Paranasal sinuses are aerated. Orbits are unremarkable. Other: Sella is unremarkable.  Mastoid air cells are clear. IMPRESSION: No acute infarction, hemorrhage, or mass. Mild to moderate chronic microvascular ischemic changes. Chronic cerebellar infarcts. Electronically Signed   By: Macy Mis M.D.   On: 03/01/2021 13:19   CBC Latest Ref Rng & Units 03/02/2021 03/01/2021 02/28/2021  WBC 4.0 - 10.5 K/uL 7.7 8.4 8.5  Hemoglobin 12.0 - 15.0 g/dL 10.7(L) 10.9(L) 11.5(L)  Hematocrit 36.0 - 46.0 % 31.3(L) 31.7(L) 35.7(L)  Platelets 150 - 400 K/uL 260 247 241   CMP Latest Ref Rng & Units 03/02/2021 03/01/2021 02/28/2021  Glucose 70 - 99 mg/dL 112(H) 99 100(H)  BUN 8 - 23 mg/dL 5(L) 7(L) 10  Creatinine 0.44 - 1.00 mg/dL 0.61 0.61 0.80  Sodium 135 - 145 mmol/L  135 139 139  Potassium 3.5 - 5.1 mmol/L 3.5 3.4(L) 3.3(L)  Chloride 98 - 111 mmol/L 107 111 111  CO2 22 - 32 mmol/L 21(L) 18(L) 17(L)  Calcium 8.9 - 10.3 mg/dL 8.2(L) 8.0(L) 7.8(L)  Total Protein 6.5 - 8.1 g/dL - - -   Total Bilirubin 0.3 - 1.2 mg/dL - - -  Alkaline Phos 38 - 126 U/L - - -  AST 15 - 41 U/L - - -  ALT 0 - 44 U/L - - -   Mg: 1.7   Assessment:  87 yo patient who presented w/ AMS and witnessed breakthrough seizures. Patient has not had repeat seizure activity while hospitalized but her mentation has been slow to recover back to baseline. Of note, she was diagnosed with a UTI for which she began tx this admission.  - Patient neurological exam continues to improve as patient orientation improves. Patient was noted to have increased weakness in BLE today, but she had had increased tone on prior exams which was decreased today with appropriate reflexes. No focal deficits noted on exam today. - Patient MRI was negative for acute CVA although there were chronic cerebellar infarcts noted  Recommendations: - Continue Keppra 1500mg  q12 hrs - Avoid sedating or deliriogenic medications when possible - On unasyn for tx UTI (E coli and E. Faecalis) - Agree with Palliative care consult - Neurology will sign off   Damita Dunnings, MD PGY-1  Electronically signed: Dr. Kerney Elbe

## 2021-03-02 NOTE — Progress Notes (Signed)
[employer]ufacturing [employer] [facility])  Hospital Liaison: RN note         This patient has been referred to our palliative care services in the community at [facility].  [facility] will continue to follow for any discharge planning needs and to coordinate continuation of palliative care in the outpatient setting.    If you have questions or need assistance, please call [phone] or contact the hospital Liaison listed on [facility].      Thank you for this referral.         Farrel Gordon, RN, CCM  [place] (listed on [place] under Hospice[facility])    [phone]

## 2021-03-02 NOTE — Care Management Important Message (Signed)
Important Message  Patient Details  Name: Renee Zuniga MRN: [id number] Date of Birth: 10/30/1931   Medicare Important Message Given:  Yes     Orbie Pyo 03/02/2021, 3:01 PM

## 2021-03-02 NOTE — Consult Note (Signed)
Consultation Note Date: 03/02/2021   Patient Name: Renee Zuniga  DOB: 09-18-1931  MRN: [id number]  Age / Sex: 87 y.o., female  PCP: Seward Carol, MD Referring Physician: Charlynne Cousins, MD  Reason for Consultation: Establishing goals of care and Hospice Evaluation  HPI/Patient Profile: 87 y.o. female  with past medical history of seizure disorder, TIA, dementia, and hypothyroidism who was admitted on 02/25/2021 with likely seizure activity that could have been stimulated by UTI.  Per hospital notes and RN report patient had a prolonged post-ictal period but is following commands better today.  She had some agitation overnight requiring haldol to be administered.  She has been evaluated by SLP and is on a regular diet.  Neurology consulted and felt the UTI had contributed to the seizure.  Unfortunately they feel that this seizure episode changed Renee Zuniga's baseline status.  Further they are concerned she will continue to have seizures that will likely hasten a decline in her health. They recommended continuing Keppra bid and Palliative Care consultation.  Clinical Assessment and Goals of Care:  I have reviewed medical records including EPIC notes, labs and imaging, received report from the bedside RN, and spoke with Renee Zuniga to discuss diagnosis prognosis, GOC, EOL wishes, disposition and options.  I introduced Palliative Medicine as specialized medical care for people living with serious illness. It focuses on providing relief from the symptoms and stress of a serious illness.  Renee Zuniga is quite familiar with [place] services as several members of her family have utilized them.  She is acquainted with Dr. Jenene Slicker   We discussed a brief life review of the patient. 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  She and her husband raised 5 children.  Unfortunately, her husband passed  away not many months ago with bladder cancer.  [facility] did a wonderful job of helping to care for him.  He was her primary care taker during her battle with alzheimer's dementia.  Since he passed she has remained in her home with 24/7 care provided. She loves to go to lunch and dinner and visit with family.  She was up doing things in the house just prior to this seizure episode.  She was still eating well. Consequently these seizures have created a steep decline in her baseline function.    We discussed her current illness and what it means in the larger context of her on-going co-morbidities.  Natural disease trajectory and expectations at EOL were discussed.  I attempted to elicit values and goals of care important to the patient.  The difference between aggressive medical intervention and comfort care was considered in light of the patient's goals of care.   A MOST form was reviewed over the phone and emailed to [facility].  Verbally she expressed the following choices for her mother:   I completed a MOST form today. The patient and family outlined their wishes for the following treatment decisions:  Cardiopulmonary Resuscitation: Do Not Attempt Resuscitation (DNR/No CPR)  Medical Interventions: Comfort Measures  Prefer not to return to the Hospital  Antibiotics: Left Blank  IV Fluids: Left Blank  Feeding Tube: Left Blank    Hospice and Palliative Care services outpatient were explained and offered.  Renee Zuniga would like for her mother to discharge to acute rehab at [facility] and be followed by [place].  Symptom management was discussed.  Renee Zuniga is very intolerant of Haldol.  It causes a paradoxical reaction of delirium in her.  She responds well to ativan and olanzapine.  Renee Zuniga talked with me about the painless bright red blood clots in her mother's stools.  She has not had a significant drop in hemoglobin.  Renee Zuniga does not want to put her mother thru any invasive  procedures such as flexible sigmoidoscopy or colonoscopy.  Consequently we agreed to try proctofoam to see if it would help calm down any possible internal hemorrhoids.(given 5 pregnancies).  Renee Zuniga  mentioned her mothers prolapsed bladder which has contributed to recurrent urinary tract infections.  She has seen Dr. Matilde Sprang of [facility] for many years to care for her bladder.  Questions and concerns were addressed.  The family was encouraged to call with questions or concerns.   Primary Decision Maker:  NEXT OF KIN, Renee Zuniga. Patient is blessed to have all 5 of her children live locally.    SUMMARY OF RECOMMENDATIONS    1.  Please document prolapsed bladder as a chronic problem. 2.  Haldol listed as an intolerance and discontinued from her orders 3.  Ativan or Olanzapine to be used for agitation. 4.  Proctofoam ordered as a treatment for possible internal hemorrhoids. 5.  Palliative Care requested at next venue.  Referral to [facility] please.  Code Status/Advance Care Planning:  DNR/DNI Comfort Measures on discharge.   Symptom Management:   As above  Additional Recommendations (Limitations, Scope, Preferences):  No invasive procedures  Palliative Prophylaxis:   Aspiration, Bowel Regimen, Delirium Protocol, Oral Care and Turn Reposition  Psycho-social/Spiritual:   Desire for further Chaplaincy support: Welcomed.  Patient is Catholic  Prognosis:  It would not be surprising if her prognosis was less than 6 months given recurrent infections, recurrent seizures, progressive dementia and rapid decline in baseline status.    Discharge Planning: [place] for rehab with Palliative care service follow-up      Primary Diagnoses: Present on Admission: . HTN (hypertension) . Hypercholesteremia . Mild neurocognitive disorder, amnestic type   I have reviewed the medical record, interviewed the patient and family, and examined the patient. The  following aspects are pertinent.  Past Medical History:  Diagnosis Date  . Arthritis   . Heart murmur   . High blood pressure   . HTN (hypertension) 11/09/2014  . Hypercholesteremia   . Insomnia   . Localization-related idiopathic epilepsy and epileptic syndromes with seizures of localized onset, not intractable, without status epilepticus ([place]) 08/12/2015  . Migraine headache   . Mild neurocognitive disorder, amnestic type 08/22/2017  . Mitral valve prolapse   . Osteoarthritis   . Osteopenia   . Pelvis fracture ([place]) 11/26/2018  . Seizures ([place])   . TIA (transient ischemic attack) 11/09/2014  . Tinnitus   . Vitamin D deficiency    Social History   Socioeconomic History  . Marital status: Married    Spouse name: Timmothy Sours  . Number of children: 5  . Years of education: 16  . Highest education level: Bachelor's degree (e.g., BA, AB, BS)  Occupational History    Comment: Retired  Tobacco Use  . Smoking status: Never Smoker  . Smokeless tobacco: Never Used  Vaping Use  . Vaping Use: Never used  Substance and Sexual Activity  . Alcohol use: Yes    Alcohol/week: 7.0 standard drinks    Types: 7 Glasses of wine per week    Comment: 1 glass of wine nightly with dinner  . Drug use: No  . Sexual activity: Not Currently  Other Topics Concern  . Not on file  Social History Narrative   Patient is retired and lives at home with her husband Timmothy Sours). Patient has college education.    Caffeine- tea - four glasses.   Right handed.   Social Determinants of Health   Financial Resource Strain: Not on file  Food Insecurity: Not on file  Transportation Needs: Not on file  Physical Activity: Not on file  Stress: Not on file  Social Connections: Not on file   Family History  Problem Relation Age of Onset  . Mitral valve prolapse Father   . Heart Problems Mother   . Hypertension Mother     Allergies  Allergen Reactions  . Ambien [Zolpidem] Nausea And Vomiting  . Demerol [Meperidine]  Nausea And Vomiting       . Haldol [Haloperidol]     delirium  . Lunesta [Eszopiclone] Other (See Comments)    Affected speech  . Tramadol       Vital Signs: BP (!) 157/71 (BP Location: Right Arm)   Pulse 100   Temp 98.2 F (36.8 C) (Oral)   Resp 18   Ht 5\' 5"  (1.651 m)   Wt 72.6 kg   SpO2 94%   BMI 26.63 kg/m  Pain Scale: 0-10   Pain Score: 0-No pain   SpO2: SpO2: 94 % O2 Device:SpO2: 94 % O2 Flow Rate: .     Palliative Assessment/Data: 30%     Time In: 11:00 Time Out: 12:05 Time Total: 65 min Visit consisted of counseling and education dealing with the complex and emotionally intense issues surrounding the need for palliative care and symptom management in the setting of serious and potentially life-threatening illness. Greater than 50%  of this time was spent counseling and coordinating care related to the above assessment and plan.  Signed by: Florentina Jenny, PA-C Palliative Medicine  Please contact Palliative Medicine Team phone at [phone] for questions and concerns.  For individual provider: See Shea Evans

## 2021-03-02 NOTE — Plan of Care (Signed)

## 2021-03-02 NOTE — Progress Notes (Signed)
[facility] PROGRESS NOTE    Progress Note  Renee Zuniga  TML:[id number] DOB: [birth year] DOA: 02/25/2021 PCP: Seward Carol, MD     Brief Narrative:   Renee Zuniga is an 87 y.o. female past medical history significant for seizure disorder, essential hypertension history of TIA dementia presents to the ED for evaluation of suspected seizure activity.  Patient unable to provide history due to suspected prolonged postictal state and/or medication effect so most of the history was obtained from the ED.  The daughter found her at home with a glazed over look incontinent in urine and stool so she brought her to the ED.  PCP called on the day of admission to notify she has a UTI daughter believes it was E. coli.  Admitted for seizure activity neurology was consulted   Assessment/Plan:   Seizure-like activity with postictal state in the setting of a history of seizures: Seen by neurology loaded with Keppra. Now on Keppra 1500 mg p.o. twice daily. EEG showed no seizure activity. She was started on a regular diet after being evaluated by speech. Further work-up per neurology, as mental status not significant improve MRI has not been completed yet. Family related they would like to meet with palliative care.  Suspect aspiration pneumonia: Continue Unasyn for 7 days today is day 5.  E. coli/Enterococcus UTI: She was started on Unasyn on 02/28/2021 will complete a 7-day course.  Watery diarrhea: C. difficile was negative on 02/28/2021.  Continues to remain afebrile with no leukocytosis. Ongoing we will start her on Metamucil, and start her on iron therapy. Continue Lomotil  Essential hypertension: Continue lisinopril.  Hypothyroidism: Synthroid has been resumed.  MCI/dementia with agitation/delirium: Rivastigmine resumed.  Normocytic anemia: Continue ferrous sulfate, daughter relates that her ferritin was done as an outpatient and she was can be started on ferrous sulfate. Check  FOBT's document if stools are red  DVT prophylaxis: lovenox Family Communication:daughter Status is: Inpatient  Remains inpatient appropriate because:Hemodynamically unstable   Dispo: The patient is from: SNF              Anticipated d/c is to: SNF              Patient currently is not medically stable to d/c.   Difficult to place patient No  Code Status:     Code Status Orders  (From admission, onward)         Start     Ordered   02/25/21 2324  Do not attempt resuscitation (DNR)  Continuous       Question Answer Comment  In the event of cardiac or respiratory ARREST Do not call a "code blue"   In the event of cardiac or respiratory ARREST Do not perform Intubation, CPR, defibrillation or ACLS   In the event of cardiac or respiratory ARREST Use medication by any route, position, wound care, and other measures to relive pain and suffering. May use oxygen, suction and manual treatment of airway obstruction as needed for comfort.      02/25/21 2324        Code Status History    Date Active Date Inactive Code Status Order ID Comments User Context   11/26/2018 0434 11/29/2018 1627 Full Code [id number]  Rise Patience, MD ED   08/19/2016 1943 08/20/2016 1620 Full Code [id number]  Florencia Reasons, MD Inpatient   11/09/2014 2353 11/10/2014 1930 Full Code [id number]  Ivor Costa, MD Inpatient   Advance Care Planning Activity  IV Access:    Peripheral IV   Procedures and diagnostic studies:   MR BRAIN WO CONTRAST  Result Date: 03/01/2021 CLINICAL DATA:  Possible stroke EXAM: MRI HEAD WITHOUT CONTRAST TECHNIQUE: Multiplanar, multiecho pulse sequences of the brain and surrounding structures were obtained without intravenous contrast. COMPARISON:  2017, recent CT imaging FINDINGS: Brain: There is no acute infarction or intracranial hemorrhage. There is no intracranial mass, mass effect, or edema. There is no hydrocephalus or extra-axial fluid collection. Patchy T2 hyperintensity  in the supratentorial white matter is nonspecific but likely reflects mild to moderate chronic microvascular ischemic changes. Small chronic bilateral cerebellar infarcts. Prominence of the ventricles and sulci reflects generalized parenchymal volume loss. Vascular: Major vessel flow voids at the skull base are preserved. Skull and upper cervical spine: Normal marrow signal is preserved. Sinuses/Orbits: Paranasal sinuses are aerated. Orbits are unremarkable. Other: Sella is unremarkable.  Mastoid air cells are clear. IMPRESSION: No acute infarction, hemorrhage, or mass. Mild to moderate chronic microvascular ischemic changes. Chronic cerebellar infarcts. Electronically Signed   By: Macy Mis M.D.   On: 03/01/2021 13:19     Medical Consultants:    None.   Subjective:    Renee Zuniga she has no new complaints except for her watery diarrhea.  Objective:    Vitals:   03/01/21 2032 03/01/21 2337 03/02/21 0425 03/02/21 0751  BP: (!) 149/69 (!) 159/69 (!) 166/88 (!) 157/71  Pulse: 92 99 96 100  Resp: 18 18 18 18   Temp: 98.3 F (36.8 C) 98 F (36.7 C) 97.6 F (36.4 C) 98.2 F (36.8 C)  TempSrc: Oral Oral Oral Oral  SpO2: 95% 95% 95% 94%  Weight:      Height:       SpO2: 94 %   Intake/Output Summary (Last 24 hours) at 03/02/2021 1044 Last data filed at 03/02/2021 0500 Gross per 24 hour  Intake 598.32 ml  Output 1250 ml  Net -651.68 ml   Filed Weights   02/25/21 1223  Weight: 72.6 kg    Exam: General exam: In no acute distress. Respiratory system: Good air movement and clear to auscultation. Cardiovascular system: S1 & S2 heard, RRR. No JVD. Gastrointestinal system: Abdomen is nondistended, soft and nontender.  Extremities: No pedal edema. Skin: No rashes, lesions or ulcers Psychiatry: Judgement and insight appear normal. Mood & affect appropriate.    Data Reviewed:    Labs: Basic Metabolic Panel: Recent Labs  Lab 02/25/21 1255 02/25/21 1327 02/26/21 0221  02/28/21 0456 03/01/21 0355 03/02/21 0357  NA 138 139 133* 139 139 135  K 3.9 4.0 3.9 3.3* 3.4* 3.5  CL 107 106 101 111 111 107  CO2 22  --  22 17* 18* 21*  GLUCOSE 112* 110* 123* 100* 99 112*  BUN 14 15 8 10  7* 5*  CREATININE 0.79 0.70 0.84 0.80 0.61 0.61  CALCIUM 9.1  --  8.7* 7.8* 8.0* 8.2*  MG  --   --   --   --  1.8 1.7   GFR Estimated Creatinine Clearance: 49.4 mL/min (by C-G formula based on SCr of 0.61 mg/dL). Liver Function Tests: Recent Labs  Lab 02/25/21 1255  AST 27  ALT 23  ALKPHOS 58  BILITOT 1.1  PROT 6.9  ALBUMIN 4.1   No results for input(s): LIPASE, AMYLASE in the last 168 hours. No results for input(s): AMMONIA in the last 168 hours. Coagulation profile Recent Labs  Lab 02/25/21 1255  INR 1.1   COVID-19 Labs  No results for input(s): DDIMER, FERRITIN, LDH, CRP in the last 72 hours.  Lab Results  Component Value Date   [place] NEGATIVE 02/25/2021    CBC: Recent Labs  Lab 02/25/21 1255 02/25/21 1327 02/26/21 0221 02/28/21 0456 03/01/21 0355 03/02/21 0357  WBC 7.1  --  8.6 8.5 8.4 7.7  NEUTROABS 4.7  --   --  6.4  --   --   HGB 12.7 12.6 12.6 11.5* 10.9* 10.7*  HCT 40.5 37.0 38.8 35.7* 31.7* 31.3*  MCV 98.3  --  96.8 96.7 94.1 93.2  PLT 284  --  270 241 247 260   Cardiac Enzymes: No results for input(s): CKTOTAL, CKMB, CKMBINDEX, TROPONINI in the last 168 hours. BNP (last 3 results) No results for input(s): PROBNP in the last 8760 hours. CBG: No results for input(s): GLUCAP in the last 168 hours. D-Dimer: No results for input(s): DDIMER in the last 72 hours. Hgb A1c: No results for input(s): HGBA1C in the last 72 hours. Lipid Profile: No results for input(s): CHOL, HDL, LDLCALC, TRIG, CHOLHDL, LDLDIRECT in the last 72 hours. Thyroid function studies: No results for input(s): TSH, T4TOTAL, T3FREE, THYROIDAB in the last 72 hours.  Invalid input(s): FREET3 Anemia work up: No results for input(s): VITAMINB12, FOLATE,  FERRITIN, TIBC, IRON, RETICCTPCT in the last 72 hours. Sepsis Labs: Recent Labs  Lab 02/26/21 0221 02/28/21 0456 03/01/21 0355 03/02/21 0357  WBC 8.6 8.5 8.4 7.7   Microbiology Recent Results (from the past 240 hour(s))  Resp Panel by RT-PCR (Flu A&B, Covid) Nasopharyngeal Swab     Status: None   Collection Time: 02/25/21 12:56 PM   Specimen: Nasopharyngeal Swab; Nasopharyngeal(NP) swabs in vial transport medium  Result Value Ref Range Status   SARS Coronavirus 2 by RT PCR NEGATIVE NEGATIVE Final    Comment: (NOTE) SARS-CoV-2 target nucleic acids are NOT DETECTED.  The SARS-CoV-2 RNA is generally detectable in upper respiratory specimens during the acute phase of infection. The lowest concentration of SARS-CoV-2 viral copies this assay can detect is 138 copies/mL. A negative result does not preclude SARS-Cov-2 infection and should not be used as the sole basis for treatment or other patient management decisions. A negative result may occur with  improper specimen collection/handling, submission of specimen other than nasopharyngeal swab, presence of viral mutation(s) within the areas targeted by this assay, and inadequate number of viral copies(<138 copies/mL). A negative result must be combined with clinical observations, patient history, and epidemiological information. The expected result is Negative.  Fact Sheet for Patients:  EntrepreneurPulse.com.au  Fact Sheet for Healthcare Providers:  IncredibleEmployment.be  This test is no t yet approved or cleared by the [place] FDA and  has been authorized for detection and/or diagnosis of SARS-CoV-2 by FDA under an Emergency Use Authorization (EUA). This EUA will remain  in effect (meaning this test can be used) for the duration of the COVID-19 declaration under Section 564(b)(1) of the Act, 21 U.S.C.section 360bbb-3(b)(1), unless the authorization is terminated  or revoked sooner.        Influenza A by PCR NEGATIVE NEGATIVE Final   Influenza B by PCR NEGATIVE NEGATIVE Final    Comment: (NOTE) The Xpert Xpress SARS-CoV-2/FLU/RSV plus assay is intended as an aid in the diagnosis of influenza from Nasopharyngeal swab specimens and should not be used as a sole basis for treatment. Nasal washings and aspirates are unacceptable for Xpert Xpress SARS-CoV-2/FLU/RSV testing.  Fact Sheet for Patients: EntrepreneurPulse.com.au  Fact Sheet for Healthcare Providers: IncredibleEmployment.be  This test is not yet approved or cleared by the [place] and has been authorized for detection and/or diagnosis of SARS-CoV-2 by FDA under an Emergency Use Authorization (EUA). This EUA will remain in effect (meaning this test can be used) for the duration of the COVID-19 declaration under Section 564(b)(1) of the Act, 21 U.S.C. section 360bbb-3(b)(1), unless the authorization is terminated or revoked.  Performed at [facility], [place] [address] [place], [place] [address]   Culture, Urine     Status: Abnormal   Collection Time: 02/25/21 11:07 PM   Specimen: Urine, Random  Result Value Ref Range Status   Specimen Description URINE, RANDOM  Final   Special Requests   Final    NONE Performed at [facility], [place] [address] [place], [place] [address]    Culture (A)  Final    70,000 COLONIES/mL ENTEROCOCCUS FAECALIS 30,000 COLONIES/mL ESCHERICHIA COLI    Report Status 02/28/2021 FINAL  Final   Organism ID, Bacteria ENTEROCOCCUS FAECALIS (A)  Final   Organism ID, Bacteria ESCHERICHIA COLI (A)  Final      Susceptibility   Escherichia coli - MIC*    AMPICILLIN <=2 SENSITIVE Sensitive     CEFAZOLIN <=4 SENSITIVE Sensitive     CEFEPIME <=0.12 SENSITIVE Sensitive     CEFTRIAXONE <=0.25 SENSITIVE Sensitive     CIPROFLOXACIN <=0.25 SENSITIVE Sensitive     GENTAMICIN <=1 SENSITIVE Sensitive     IMIPENEM <=0.25 SENSITIVE  Sensitive     NITROFURANTOIN <=16 SENSITIVE Sensitive     TRIMETH/SULFA >=320 RESISTANT Resistant     AMPICILLIN/SULBACTAM <=2 SENSITIVE Sensitive     PIP/TAZO <=4 SENSITIVE Sensitive     * 30,000 COLONIES/mL ESCHERICHIA COLI   Enterococcus faecalis - MIC*    AMPICILLIN <=2 SENSITIVE Sensitive     NITROFURANTOIN <=16 SENSITIVE Sensitive     VANCOMYCIN 1 SENSITIVE Sensitive     * 70,000 COLONIES/mL ENTEROCOCCUS FAECALIS  Culture, blood (routine x 2)     Status: None (Preliminary result)   Collection Time: 02/26/21 12:34 PM   Specimen: BLOOD RIGHT HAND  Result Value Ref Range Status   Specimen Description BLOOD RIGHT HAND  Final   Special Requests   Final    BOTTLES DRAWN AEROBIC ONLY Blood Culture results may not be optimal due to an inadequate volume of blood received in culture bottles   Culture   Final    NO GROWTH 4 DAYS Performed at [facility], [address], [place], [place] [address]    Report Status PENDING  Incomplete  Culture, blood (routine x 2)     Status: None (Preliminary result)   Collection Time: 02/26/21 12:42 PM   Specimen: BLOOD LEFT HAND  Result Value Ref Range Status   Specimen Description BLOOD LEFT HAND  Final   Special Requests   Final    BOTTLES DRAWN AEROBIC ONLY Blood Culture results may not be optimal due to an inadequate volume of blood received in culture bottles   Culture   Final    NO GROWTH 4 DAYS Performed at [facility], [place] [address], [place],  [address]    Report Status PENDING  Incomplete  C Difficile Quick Screen (NO PCR Reflex)     Status: None   Collection Time: 02/28/21  1:42 PM   Specimen: STOOL  Result Value Ref Range Status   C Diff antigen NEGATIVE NEGATIVE Final   C Diff toxin NEGATIVE NEGATIVE Final  C Diff interpretation No C. difficile detected.  Final    Comment: Performed at [facility], [place] [address] [place], [place] [address]     Medications:   . enoxaparin (LOVENOX) injection  40 mg  Subcutaneous Q24H  . escitalopram  10 mg Oral Daily  . ezetimibe  10 mg Oral QHS  . levothyroxine  50 mcg Oral QAC breakfast  . lisinopril  10 mg Oral Daily  . pentoxifylline  400 mg Oral TID WC  . rivastigmine  1.5 mg Oral BID   Continuous Infusions: . sodium chloride 1,000 mL (02/26/21 1305)  . ampicillin-sulbactam (UNASYN) IV 1.5 g (03/02/21 0428)  . levETIRAcetam 1,500 mg (03/02/21 0542)      LOS: 4 days   Charlynne Cousins  [facility]  03/02/2021, 10:44 AM

## 2021-03-03 DIAGNOSIS — B9620 Unspecified Escherichia coli [E. coli] as the cause of diseases classified elsewhere: Secondary | ICD-10-CM | POA: Diagnosis not present

## 2021-03-03 DIAGNOSIS — Z7401 Bed confinement status: Secondary | ICD-10-CM | POA: Diagnosis not present

## 2021-03-03 DIAGNOSIS — M1990 Unspecified osteoarthritis, unspecified site: Secondary | ICD-10-CM | POA: Diagnosis not present

## 2021-03-03 DIAGNOSIS — N390 Urinary tract infection, site not specified: Secondary | ICD-10-CM | POA: Diagnosis not present

## 2021-03-03 DIAGNOSIS — I4891 Unspecified atrial fibrillation: Secondary | ICD-10-CM | POA: Diagnosis not present

## 2021-03-03 DIAGNOSIS — M2550 Pain in unspecified joint: Secondary | ICD-10-CM | POA: Diagnosis not present

## 2021-03-03 DIAGNOSIS — E039 Hypothyroidism, unspecified: Secondary | ICD-10-CM | POA: Diagnosis not present

## 2021-03-03 DIAGNOSIS — F0391 Unspecified dementia with behavioral disturbance: Secondary | ICD-10-CM | POA: Diagnosis not present

## 2021-03-03 DIAGNOSIS — R569 Unspecified convulsions: Secondary | ICD-10-CM | POA: Diagnosis not present

## 2021-03-03 DIAGNOSIS — E785 Hyperlipidemia, unspecified: Secondary | ICD-10-CM | POA: Diagnosis not present

## 2021-03-03 DIAGNOSIS — R531 Weakness: Secondary | ICD-10-CM | POA: Diagnosis not present

## 2021-03-03 DIAGNOSIS — I10 Essential (primary) hypertension: Secondary | ICD-10-CM | POA: Diagnosis not present

## 2021-03-03 DIAGNOSIS — G40909 Epilepsy, unspecified, not intractable, without status epilepticus: Secondary | ICD-10-CM | POA: Diagnosis not present

## 2021-03-03 DIAGNOSIS — D649 Anemia, unspecified: Secondary | ICD-10-CM | POA: Diagnosis not present

## 2021-03-03 DIAGNOSIS — B952 Enterococcus as the cause of diseases classified elsewhere: Secondary | ICD-10-CM | POA: Diagnosis not present

## 2021-03-03 DIAGNOSIS — N3000 Acute cystitis without hematuria: Secondary | ICD-10-CM | POA: Diagnosis not present

## 2021-03-03 DIAGNOSIS — R404 Transient alteration of awareness: Secondary | ICD-10-CM | POA: Diagnosis not present

## 2021-03-03 LAB — CULTURE, BLOOD (ROUTINE X 2)
Culture: NO GROWTH
Culture: NO GROWTH

## 2021-03-03 LAB — RESP PANEL BY RT-PCR (FLU A&B, COVID) ARPGX2
Influenza A by PCR: NEGATIVE
Influenza B by PCR: NEGATIVE
SARS Coronavirus 2 by RT PCR: NEGATIVE

## 2021-03-03 LAB — MAGNESIUM: Magnesium: 1.8 mg/dL (ref 1.7–2.4)

## 2021-03-03 MED ORDER — FERROUS SULFATE 325 (65 FE) MG PO TABS: 325.0000 mg | ORAL_TABLET | Freq: Every day | ORAL | 3 refills | Status: DC

## 2021-03-03 MED ORDER — LEVETIRACETAM 750 MG PO TABS: 1500.0000 mg | ORAL_TABLET | Freq: Two times a day (BID) | ORAL | Status: AC

## 2021-03-03 MED ORDER — AMOXICILLIN-POT CLAVULANATE 875-125 MG PO TABS: 1.0000 | ORAL_TABLET | Freq: Two times a day (BID) | ORAL | 0 refills | Status: AC

## 2021-03-03 NOTE — Discharge Summary (Signed)
Physician Discharge Summary  Renee Zuniga:[id number] DOB: 01-11-1931 DOA: 02/25/2021  PCP: Seward Carol, MD  Admit date: 02/25/2021 Discharge date: 03/03/2021  Admitted From: Home Disposition:  SNF  Recommendations for Outpatient Follow-up:  1. Follow up with PCP in 1-2 weeks 2. Please obtain BMP/CBC in one week 3. She will need to follow-up with urologist in 2 to 4 weeks.   Home Health:No Equipment/Devices:None  Discharge Condition:Stable CODE STATUS:DNR Diet recommendation: Heart Healthy  Brief/Interim Summary: 87 y.o. female past medical history significant for seizure disorder, essential hypertension history of TIA dementia presents to the ED for evaluation of suspected seizure activity.  Patient unable to provide history due to suspected prolonged postictal state and/or medication effect so most of the history was obtained from the ED.  The daughter found her at home with a glazed over look incontinent in urine and stool so she brought her to the ED.  PCP called on the day of admission to notify she has a UTI daughter believes it was E. coli.  Admitted for seizure activity neurology was consulted  Discharge Diagnoses:  Principal Problem:   Seizure-like activity ([place]) Active Problems:   Hypercholesteremia   HTN (hypertension)   Mild neurocognitive disorder, amnestic type   Hypothyroidism   Seizure disorder ([place]) Seizure-like activity with postictal state in the setting of a history of seizures: Neurology was consulted she was loaded with Keppra EEG showed no further seizure activities. She was started on a regular diet after being evaluated by speech. MRI did not show any acute strokes. Physical therapy was consulted and they recommended skilled nursing facility.  Suspected aspiration pneumonia: She completed 6-day course of antibiotics we will continue antibiotics for 1 additional day as an outpatient.  E. coli/Enterococcus UTI: She started empirically on IV Zosyn  urine cultures grew E. coli sensitive to Unasyn and she was transitioned to oral Augmentin which should continue as an outpatient.  Central hypertension: No change made to her medication.  Hypothyroidism: Continue Synthroid.  Advanced dementia with agitation/delirium: Continue rivastigmine no changes made.  Normocytic anemia: Continue ferrous sulfate as an outpatient.    Discharge Instructions  Discharge Instructions    Diet - low sodium heart healthy   Complete by: As directed    Increase activity slowly   Complete by: As directed      Allergies as of 03/03/2021      Reactions   Ambien [zolpidem] Nausea And Vomiting   Demerol [meperidine] Nausea And Vomiting      Haldol [haloperidol]    delirium   Lunesta [eszopiclone] Other (See Comments)   Affected speech   Tramadol       Medication List    TAKE these medications   acetaminophen 325 MG tablet Commonly known as: TYLENOL Take 2 tablets (650 mg total) by mouth every 6 (six) hours as needed for mild pain (or Fever >/= 101).   amoxicillin-clavulanate 875-125 MG tablet Commonly known as: Augmentin Take 1 tablet by mouth 2 (two) times daily for 1 day.   cyclobenzaprine 5 MG tablet Commonly known as: FLEXERIL Take 1 tablet (5 mg total) by mouth 3 (three) times daily as needed for muscle spasms.   diclofenac 75 MG EC tablet Commonly known as: VOLTAREN Take 75 mg by mouth 2 (two) times daily.   diclofenac sodium 1 % Gel Commonly known as: VOLTAREN Apply 2 g topically daily. For knee pain   escitalopram 10 MG tablet Commonly known as: Lexapro Take 1 tablet (10 mg total) by  mouth daily.   ezetimibe 10 MG tablet Commonly known as: ZETIA Take 10 mg by mouth at bedtime.   furosemide 20 MG tablet Commonly known as: LASIX Take 20 mg by mouth as needed for fluid or edema.   levETIRAcetam 750 MG tablet Commonly known as: KEPPRA Take 2 tablets (1,500 mg total) by mouth 2 (two) times daily.   levothyroxine 50  MCG tablet Commonly known as: SYNTHROID Take 50 mcg by mouth daily before breakfast.   lisinopril 10 MG tablet Commonly known as: ZESTRIL Take 10 mg by mouth daily.   LORazepam 1 MG tablet Commonly known as: ATIVAN TAKE 1 TABLET AS NEEDED FOR SEIZURE. DO NOT TAKE MORE THAN 2 IN 24 HRS. What changed:   how much to take  how to take this  when to take this  reasons to take this  additional instructions   multivitamin with minerals tablet Take 1 tablet by mouth daily.   naproxen sodium 220 MG tablet Commonly known as: ALEVE Take 440 mg by mouth at bedtime.   nitrofurantoin (macrocrystal-monohydrate) 100 MG capsule Commonly known as: MACROBID Take 100 mg by mouth daily.   pentoxifylline 400 MG CR tablet Commonly known as: TRENTAL Take 1 tablet (400 mg total) by mouth 3 (three) times daily with meals.   pramipexole 0.125 MG tablet Commonly known as: MIRAPEX Take 0.375 mg by mouth at bedtime.   rivastigmine 1.5 MG capsule Commonly known as: EXELON TAKE (1) CAPSULE TWICE DAILY. What changed: See the new instructions.       Contact information for after-discharge care    Destination    [facility] .   Service: Skilled Nursing Contact information: [address] [place] [address] [phone]                 Allergies  Allergen Reactions  . Ambien [Zolpidem] Nausea And Vomiting  . Demerol [Meperidine] Nausea And Vomiting       . Haldol [Haloperidol]     delirium  . Lunesta [Eszopiclone] Other (See Comments)    Affected speech  . Tramadol     Consultations:  Neurology   Procedures/Studies: CT ANGIOGRAM HEAD NECK W WO CONTRAST  Result Date: 02/25/2021 CLINICAL DATA:  Initial evaluation for acute stroke. EXAM: CT ANGIOGRAPHY HEAD AND NECK TECHNIQUE: Multidetector CT imaging of the head and neck was performed using the standard protocol during bolus administration of intravenous contrast.  Multiplanar CT image reconstructions and MIPs were obtained to evaluate the vascular anatomy. Carotid stenosis measurements (when applicable) are obtained utilizing NASCET criteria, using the distal internal carotid diameter as the denominator. CONTRAST:  10mL OMNIPAQUE IOHEXOL 350 MG/ML SOLN COMPARISON:  Prior head CT from earlier the same day. FINDINGS: CT HEAD FINDINGS Brain: Examination degraded by motion and patient positioning. Age-related cerebral atrophy with chronic small vessel ischemic disease. Small remote left cerebellar infarct noted. No visible acute large vessel territory infarct. No visible intracranial hemorrhage. No mass lesion, midline shift or mass effect. No hydrocephalus or visible extra-axial fluid collection. Vascular: No hyperdense vessel. Scattered vascular calcifications noted within the carotid siphons. Skull: Scalp soft tissues and calvarium within normal limits. Sinuses: Visualized paranasal sinuses are clear. No mastoid effusion. Orbits: Globes and orbital soft tissues within normal limits. Review of the MIP images confirms the above findings CTA NECK FINDINGS Aortic arch: Visualized aortic arch of normal caliber with normal branch pattern. No hemodynamically significant stenosis seen about the origin of the great vessels. Right carotid system:  Right common and internal carotid arteries widely patent without stenosis, dissection or occlusion. Left carotid system: Left CCA patent from its origin to the bifurcation without stenosis. Mild calcified plaque about the left bifurcation without significant stenosis. Left ICA patent distally without stenosis, dissection or occlusion. Vertebral arteries: Both vertebral arteries arise from the subclavian arteries. No proximal subclavian artery stenosis. Both vertebral arteries widely patent without stenosis, dissection or occlusion. Skeleton: No acute osseous finding. No discrete or worrisome osseous lesions. Other neck: No other acute soft  tissue abnormality within the neck. No mass or adenopathy. Upper chest: Visualized upper chest demonstrates no acute finding. Calcified granuloma noted within the left lower lobe. Review of the MIP images confirms the above findings CTA HEAD FINDINGS Anterior circulation: Petrous segments patent bilaterally. Atheromatous change within the carotid siphons without hemodynamically significant stenosis. A1 segments widely patent. Right A1 hypoplastic. Normal anterior communicating artery complex. Anterior cerebral arteries patent to their distal aspects without stenosis. No M1 stenosis or occlusion. Normal MCA bifurcations. Distal MCA branches well perfused and symmetric. Posterior circulation: Both V4 segments widely patent to the vertebrobasilar junction without stenosis. Neither PICA origin well visualized. Basilar widely patent to its distal aspect without stenosis. Superior cerebellar arteries patent bilaterally. Both PCAs primarily supplied via the basilar well perfused to their distal aspects. Venous sinuses: Patent. Anatomic variants: None significant.  No aneurysm. Review of the MIP images confirms the above findings IMPRESSION: CT HEAD IMPRESSION: 1. Motion degraded exam. 2. No definite acute intracranial abnormality. 3. Age-related cerebral atrophy with chronic small vessel ischemic disease. Small remote left cerebellar infarct. CTA HEAD AND NECK IMPRESSION: 1. Negative CTA for large vessel occlusion. 2. Mild for age atheromatous change about the major arterial vasculature of the head and neck. No hemodynamically significant or correctable stenosis. No other acute vascular abnormality. Electronically Signed   By: Jeannine Boga M.D.   On: 02/25/2021 20:05   DG Shoulder Right  Result Date: 02/25/2021 CLINICAL DATA:  Patient found down today. Right shoulder pain. Initial encounter. EXAM: RIGHT SHOULDER - 2+ VIEW COMPARISON:  None. FINDINGS: No acute bony or joint abnormality is identified. Moderate  acromioclavicular osteoarthritis is noted. The glenohumeral joint appears normal. Soft tissues are negative. IMPRESSION: No acute abnormality. Moderate acromioclavicular osteoarthritis. Electronically Signed   By: Inge Rise M.D.   On: 02/25/2021 20:12   CT HEAD WO CONTRAST  Addendum Date: 02/25/2021   ADDENDUM REPORT: 02/25/2021 20:05 ADDENDUM: It has come to our attention that this particular CT unit has developed an artifact today which causes this low-density in this same location of the left hemisphere. In all cases to this point, this has been artifactual and not indicative of true pathology. If there is clinical question, the exam could be repeated, but I believe that there is no acute abnormality affecting the left hemisphere. These results will be called to the ordering clinician or representative by the Radiologist Assistant, and communication documented in the PACS or [employer]. Electronically Signed   By: Nelson Chimes M.D.   On: 02/25/2021 20:05   Result Date: 02/25/2021 CLINICAL DATA:  Seizure. Acute CVA suspected. Neuro deficit. Last normal yesterday. EXAM: CT HEAD WITHOUT CONTRAST TECHNIQUE: Contiguous axial images were obtained from the base of the skull through the vertex without intravenous contrast. COMPARISON:  08/19/2016 MRI brain. 08/20/2016 CT angiogram of the head neck. FINDINGS: Brain: Motion degraded scan, limiting assessment. Loss of gray-white differentiation throughout left parietal and occipital lobes with associated mild sulcal effacement. No extra-axial collection  or parenchymal hemorrhage. No midline shift or mass lesion. Moderate periventricular and subcortical white matter hypodensity. Generalized cerebral volume loss. Generalized ventricular dilatation is not substantially changed and compatible with cerebral volume loss. Vascular: No acute abnormality. Skull: No evidence of calvarial fracture. Sinuses/Orbits: The visualized paranasal sinuses are essentially  clear. Other:  The mastoid air cells are unopacified. IMPRESSION: 1. Motion degraded scan, limiting assessment. 2. Loss of gray-white differentiation throughout left parietal and occipital lobes with associated mild sulcal effacement, suspicious for acute left PCA territory infarct. 3. No acute intracranial hemorrhage.  No midline shift. 4. Generalized cerebral volume loss and moderate chronic small vessel ischemic changes in the cerebral white matter. Critical Value/emergent results were called by telephone at the time of interpretation on 02/25/2021 at 2:55 pm to provider [facility], who verbally acknowledged these results. Electronically Signed: By: Ilona Sorrel M.D. On: 02/25/2021 15:43   MR BRAIN WO CONTRAST  Result Date: 03/01/2021 CLINICAL DATA:  Possible stroke EXAM: MRI HEAD WITHOUT CONTRAST TECHNIQUE: Multiplanar, multiecho pulse sequences of the brain and surrounding structures were obtained without intravenous contrast. COMPARISON:  2017, recent CT imaging FINDINGS: Brain: There is no acute infarction or intracranial hemorrhage. There is no intracranial mass, mass effect, or edema. There is no hydrocephalus or extra-axial fluid collection. Patchy T2 hyperintensity in the supratentorial white matter is nonspecific but likely reflects mild to moderate chronic microvascular ischemic changes. Small chronic bilateral cerebellar infarcts. Prominence of the ventricles and sulci reflects generalized parenchymal volume loss. Vascular: Major vessel flow voids at the skull base are preserved. Skull and upper cervical spine: Normal marrow signal is preserved. Sinuses/Orbits: Paranasal sinuses are aerated. Orbits are unremarkable. Other: Sella is unremarkable.  Mastoid air cells are clear. IMPRESSION: No acute infarction, hemorrhage, or mass. Mild to moderate chronic microvascular ischemic changes. Chronic cerebellar infarcts. Electronically Signed   By: Macy Mis M.D.   On: 03/01/2021 13:19   DG CHEST  PORT 1 VIEW  Result Date: 02/26/2021 CLINICAL DATA:  87 year old female with fever.  Seizure. EXAM: PORTABLE CHEST 1 VIEW COMPARISON:  Portable chest 08/19/2016. FINDINGS: Portable AP semi upright view at 0907 hours. Lung volumes and mediastinal contours have not significantly changed. Visualized tracheal air column is within normal limits. Mild increased interstitial markings diffusely compared to 2017. No overt edema. No pneumothorax, pleural effusion or confluent opacity. Mild levoconvex upper thoracic scoliosis. No acute osseous abnormality identified. Cholecystectomy clips in the right upper quadrant with a paucity of upper abdominal bowel gas. IMPRESSION: 1. Mild diffuse increased pulmonary interstitial opacity. This might be vascular congestion without overt edema, although viral/atypical respiratory infection is difficult to exclude in the setting of fever. 2. No other acute cardiopulmonary abnormality. Electronically Signed   By: Genevie Ann M.D.   On: 02/26/2021 09:42   EEG adult  Result Date: 02/26/2021 Lora Havens, MD     02/26/2021  7:03 AM Patient Name: Renee Zuniga MRN: [id number] Epilepsy Attending: Lora Havens Referring Physician/Provider: Dr Su Monks Date: 02/26/2021 Duration: 24.02 mins Patient history: 87yo F with seizure like activity. EEG to evaluate for seizure Level of alertness: Awake AEDs during EEG study: LEV Technical aspects: This EEG study was done with scalp electrodes positioned according to the 10-20 International system of electrode placement. Electrical activity was acquired at a sampling rate of 500Hz  and reviewed with a high frequency filter of 70Hz  and a low frequency filter of 1Hz . EEG data were recorded continuously and digitally stored. Description: No posterior dominant rhythm was  seen. EEG showed continuous generalized 3 to 6 Hz theta-delta slowing. Hyperventilation and photic stimulation were not performed.   Of note, study was technically difficult due to  significant movement and electrode artifact. ABNORMALITY - Continuous slow, generalized IMPRESSION: This technically difficult study is suggestive of moderate diffuse encephalopathy, nonspecific etiology. No seizures or epileptiform discharges were seen throughout the recording. If suspicion for interictal activity remains a concern, a repeat or prolonged study can be considered. Priyanka O Yadav   VAS [place] LOWER EXTREMITY VENOUS (DVT)  Result Date: 02/10/2021  Lower Venous DVT Study Indications: Pain, and Swelling.  Risk Factors: None identified. Limitations: Poor ultrasound/tissue interface. Comparison Study: No prior studies. Performing Technologist: Oliver Hum RVT  Examination Guidelines: A complete evaluation includes B-mode imaging, spectral Doppler, color Doppler, and power Doppler as needed of all accessible portions of each vessel. Bilateral testing is considered an integral part of a complete examination. Limited examinations for reoccurring indications may be performed as noted. The reflux portion of the exam is performed with the patient in reverse Trendelenburg.  +-----+---------------+---------+-----------+----------+--------------+ RIGHTCompressibilityPhasicitySpontaneityPropertiesThrombus Aging +-----+---------------+---------+-----------+----------+--------------+ CFV  Full           Yes      Yes                                 +-----+---------------+---------+-----------+----------+--------------+   +---------+---------------+---------+-----------+----------+--------------+ LEFT     CompressibilityPhasicitySpontaneityPropertiesThrombus Aging +---------+---------------+---------+-----------+----------+--------------+ CFV      Full           Yes      Yes                                 +---------+---------------+---------+-----------+----------+--------------+ SFJ      Full                                                         +---------+---------------+---------+-----------+----------+--------------+ FV Prox  Full                                                        +---------+---------------+---------+-----------+----------+--------------+ FV Mid   Full                                                        +---------+---------------+---------+-----------+----------+--------------+ FV DistalFull                                                        +---------+---------------+---------+-----------+----------+--------------+ PFV      Full                                                        +---------+---------------+---------+-----------+----------+--------------+  POP      Full           Yes      Yes                                 +---------+---------------+---------+-----------+----------+--------------+ PTV      Full                                                        +---------+---------------+---------+-----------+----------+--------------+ PERO     Full                                                        +---------+---------------+---------+-----------+----------+--------------+     Summary: RIGHT: - No evidence of common femoral vein obstruction.  LEFT: - There is no evidence of deep vein thrombosis in the lower extremity.  - No cystic structure found in the popliteal fossa.  *See table(s) above for measurements and observations. Electronically signed by Monica Martinez MD on 02/10/2021 at 3:48:04 PM.    Final      Subjective: No new complaints.  Discharge Exam: Vitals:   03/03/21 0334 03/03/21 0830  BP: (!) 141/124 (!) 143/82  Pulse: (!) 101 96  Resp: 18 18  Temp: 98.7 F (37.1 C) 97.9 F (36.6 C)  SpO2: 97% 100%   Vitals:   03/02/21 1950 03/02/21 2329 03/03/21 0334 03/03/21 0830  BP: (!) 160/68 (!) 154/84 (!) 141/124 (!) 143/82  Pulse: 96 91 (!) 101 96  Resp: 19 19 18 18   Temp: 99.1 F (37.3 C) 97.7 F (36.5 C) 98.7 F (37.1 C) 97.9 F (36.6  C)  TempSrc: Oral Oral Oral Oral  SpO2: 95% 96% 97% 100%  Weight:      Height:        General: Pt is alert, awake, not in acute distress Cardiovascular: RRR, S1/S2 +, no rubs, no gallops Respiratory: CTA bilaterally, no wheezing, no rhonchi Abdominal: Soft, NT, ND, bowel sounds + Extremities: no edema, no cyanosis    The results of significant diagnostics from this hospitalization (including imaging, microbiology, ancillary and laboratory) are listed below for reference.     Microbiology: Recent Results (from the past 240 hour(s))  Resp Panel by RT-PCR (Flu A&B, Covid) Nasopharyngeal Swab     Status: None   Collection Time: 02/25/21 12:56 PM   Specimen: Nasopharyngeal Swab; Nasopharyngeal(NP) swabs in vial transport medium  Result Value Ref Range Status   SARS Coronavirus 2 by RT PCR NEGATIVE NEGATIVE Final    Comment: (NOTE) SARS-CoV-2 target nucleic acids are NOT DETECTED.  The SARS-CoV-2 RNA is generally detectable in upper respiratory specimens during the acute phase of infection. The lowest concentration of SARS-CoV-2 viral copies this assay can detect is 138 copies/mL. A negative result does not preclude SARS-Cov-2 infection and should not be used as the sole basis for treatment or other patient management decisions. A negative result may occur with  improper specimen collection/handling, submission of specimen other than nasopharyngeal swab, presence of viral mutation(s) within the areas targeted by this assay, and inadequate number of viral copies(<138 copies/mL). A negative result  must be combined with clinical observations, patient history, and epidemiological information. The expected result is Negative.  Fact Sheet for Patients:  EntrepreneurPulse.com.au  Fact Sheet for Healthcare Providers:  IncredibleEmployment.be  This test is no t yet approved or cleared by the [place] FDA and  has been authorized for detection  and/or diagnosis of SARS-CoV-2 by FDA under an Emergency Use Authorization (EUA). This EUA will remain  in effect (meaning this test can be used) for the duration of the COVID-19 declaration under Section 564(b)(1) of the Act, 21 U.S.C.section 360bbb-3(b)(1), unless the authorization is terminated  or revoked sooner.       Influenza A by PCR NEGATIVE NEGATIVE Final   Influenza B by PCR NEGATIVE NEGATIVE Final    Comment: (NOTE) The Xpert Xpress SARS-CoV-2/FLU/RSV plus assay is intended as an aid in the diagnosis of influenza from Nasopharyngeal swab specimens and should not be used as a sole basis for treatment. Nasal washings and aspirates are unacceptable for Xpert Xpress SARS-CoV-2/FLU/RSV testing.  Fact Sheet for Patients: EntrepreneurPulse.com.au  Fact Sheet for Healthcare Providers: IncredibleEmployment.be  This test is not yet approved or cleared by the [place] FDA and has been authorized for detection and/or diagnosis of SARS-CoV-2 by FDA under an Emergency Use Authorization (EUA). This EUA will remain in effect (meaning this test can be used) for the duration of the COVID-19 declaration under Section 564(b)(1) of the Act, 21 U.S.C. section 360bbb-3(b)(1), unless the authorization is terminated or revoked.  Performed at [facility], [place] [address] [place], [place] [address]   Culture, Urine     Status: Abnormal   Collection Time: 02/25/21 11:07 PM   Specimen: Urine, Random  Result Value Ref Range Status   Specimen Description URINE, RANDOM  Final   Special Requests   Final    NONE Performed at [facility], [place] [address], [place],  [address]    Culture (A)  Final    70,000 COLONIES/mL ENTEROCOCCUS FAECALIS 30,000 COLONIES/mL ESCHERICHIA COLI    Report Status 02/28/2021 FINAL  Final   Organism ID, Bacteria ENTEROCOCCUS FAECALIS (A)  Final   Organism ID, Bacteria ESCHERICHIA COLI (A)  Final       Susceptibility   Escherichia coli - MIC*    AMPICILLIN <=2 SENSITIVE Sensitive     CEFAZOLIN <=4 SENSITIVE Sensitive     CEFEPIME <=0.12 SENSITIVE Sensitive     CEFTRIAXONE <=0.25 SENSITIVE Sensitive     CIPROFLOXACIN <=0.25 SENSITIVE Sensitive     GENTAMICIN <=1 SENSITIVE Sensitive     IMIPENEM <=0.25 SENSITIVE Sensitive     NITROFURANTOIN <=16 SENSITIVE Sensitive     TRIMETH/SULFA >=320 RESISTANT Resistant     AMPICILLIN/SULBACTAM <=2 SENSITIVE Sensitive     PIP/TAZO <=4 SENSITIVE Sensitive     * 30,000 COLONIES/mL ESCHERICHIA COLI   Enterococcus faecalis - MIC*    AMPICILLIN <=2 SENSITIVE Sensitive     NITROFURANTOIN <=16 SENSITIVE Sensitive     VANCOMYCIN 1 SENSITIVE Sensitive     * 70,000 COLONIES/mL ENTEROCOCCUS FAECALIS  Culture, blood (routine x 2)     Status: None   Collection Time: 02/26/21 12:34 PM   Specimen: BLOOD RIGHT HAND  Result Value Ref Range Status   Specimen Description BLOOD RIGHT HAND  Final   Special Requests   Final    BOTTLES DRAWN AEROBIC ONLY Blood Culture results may not be optimal due to an inadequate volume of blood received in culture bottles   Culture   Final  NO GROWTH 5 DAYS Performed at [facility], [place] [address] [place], [place] [address]    Report Status 03/03/2021 FINAL  Final  Culture, blood (routine x 2)     Status: None   Collection Time: 02/26/21 12:42 PM   Specimen: BLOOD LEFT HAND  Result Value Ref Range Status   Specimen Description BLOOD LEFT HAND  Final   Special Requests   Final    BOTTLES DRAWN AEROBIC ONLY Blood Culture results may not be optimal due to an inadequate volume of blood received in culture bottles   Culture   Final    NO GROWTH 5 DAYS Performed at [facility], [place] [address] [place], [place] [address]    Report Status 03/03/2021 FINAL  Final  C Difficile Quick Screen (NO PCR Reflex)     Status: None   Collection Time: 02/28/21  1:42 PM   Specimen: STOOL  Result Value Ref Range Status   C  Diff antigen NEGATIVE NEGATIVE Final   C Diff toxin NEGATIVE NEGATIVE Final   C Diff interpretation No C. difficile detected.  Final    Comment: Performed at [facility], [place] [address] [place], [place] [address]     Labs: BNP (last 3 results) No results for input(s): BNP in the last 8760 hours. Basic Metabolic Panel: Recent Labs  Lab 02/25/21 1255 02/25/21 1327 02/26/21 0221 02/28/21 0456 03/01/21 0355 03/02/21 0357 03/02/21 1226 03/03/21 0827  NA 138 139 133* 139 139 135  --   --   K 3.9 4.0 3.9 3.3* 3.4* 3.5  --   --   CL 107 106 101 111 111 107  --   --   CO2 22  --  22 17* 18* 21*  --   --   GLUCOSE 112* 110* 123* 100* 99 112*  --   --   BUN 14 15 8 10  7* 5*  --   --   CREATININE 0.79 0.70 0.84 0.80 0.61 0.61  --   --   CALCIUM 9.1  --  8.7* 7.8* 8.0* 8.2*  --   --   MG  --   --   --   --  1.8 1.7 1.7 1.8   Liver Function Tests: Recent Labs  Lab 02/25/21 1255  AST 27  ALT 23  ALKPHOS 58  BILITOT 1.1  PROT 6.9  ALBUMIN 4.1   No results for input(s): LIPASE, AMYLASE in the last 168 hours. No results for input(s): AMMONIA in the last 168 hours. CBC: Recent Labs  Lab 02/25/21 1255 02/25/21 1327 02/26/21 0221 02/28/21 0456 03/01/21 0355 03/02/21 0357  WBC 7.1  --  8.6 8.5 8.4 7.7  NEUTROABS 4.7  --   --  6.4  --   --   HGB 12.7 12.6 12.6 11.5* 10.9* 10.7*  HCT 40.5 37.0 38.8 35.7* 31.7* 31.3*  MCV 98.3  --  96.8 96.7 94.1 93.2  PLT 284  --  270 241 247 260   Cardiac Enzymes: No results for input(s): CKTOTAL, CKMB, CKMBINDEX, TROPONINI in the last 168 hours. BNP: Invalid input(s): POCBNP CBG: No results for input(s): GLUCAP in the last 168 hours. D-Dimer No results for input(s): DDIMER in the last 72 hours. Hgb A1c No results for input(s): HGBA1C in the last 72 hours. Lipid Profile No results for input(s): CHOL, HDL, LDLCALC, TRIG, CHOLHDL, LDLDIRECT in the last 72 hours. Thyroid function studies No results for input(s): TSH, T4TOTAL,  T3FREE,  THYROIDAB in the last 72 hours.  Invalid input(s): FREET3 Anemia work up No results for input(s): VITAMINB12, FOLATE, FERRITIN, TIBC, IRON, RETICCTPCT in the last 72 hours. Urinalysis    Component Value Date/Time   COLORURINE STRAW (A) 02/25/2021 1840   APPEARANCEUR CLEAR 02/25/2021 1840   LABSPEC 1.012 02/25/2021 1840   PHURINE 8.0 02/25/2021 1840   GLUCOSEU NEGATIVE 02/25/2021 1840   HGBUR SMALL (A) 02/25/2021 1840   BILIRUBINUR NEGATIVE 02/25/2021 1840   BILIRUBINUR neg 10/06/2012 0825   KETONESUR 5 (A) 02/25/2021 1840   PROTEINUR NEGATIVE 02/25/2021 1840   UROBILINOGEN 0.2 05/06/2015 1825   NITRITE NEGATIVE 02/25/2021 1840   LEUKOCYTESUR SMALL (A) 02/25/2021 1840   Sepsis Labs Invalid input(s): PROCALCITONIN,  WBC,  LACTICIDVEN Microbiology Recent Results (from the past 240 hour(s))  Resp Panel by RT-PCR (Flu A&B, Covid) Nasopharyngeal Swab     Status: None   Collection Time: 02/25/21 12:56 PM   Specimen: Nasopharyngeal Swab; Nasopharyngeal(NP) swabs in vial transport medium  Result Value Ref Range Status   SARS Coronavirus 2 by RT PCR NEGATIVE NEGATIVE Final    Comment: (NOTE) SARS-CoV-2 target nucleic acids are NOT DETECTED.  The SARS-CoV-2 RNA is generally detectable in upper respiratory specimens during the acute phase of infection. The lowest concentration of SARS-CoV-2 viral copies this assay can detect is 138 copies/mL. A negative result does not preclude SARS-Cov-2 infection and should not be used as the sole basis for treatment or other patient management decisions. A negative result may occur with  improper specimen collection/handling, submission of specimen other than nasopharyngeal swab, presence of viral mutation(s) within the areas targeted by this assay, and inadequate number of viral copies(<138 copies/mL). A negative result must be combined with clinical observations, patient history, and epidemiological information. The expected result is  Negative.  Fact Sheet for Patients:  EntrepreneurPulse.com.au  Fact Sheet for Healthcare Providers:  IncredibleEmployment.be  This test is no t yet approved or cleared by the [place] FDA and  has been authorized for detection and/or diagnosis of SARS-CoV-2 by FDA under an Emergency Use Authorization (EUA). This EUA will remain  in effect (meaning this test can be used) for the duration of the COVID-19 declaration under Section 564(b)(1) of the Act, 21 U.S.C.section 360bbb-3(b)(1), unless the authorization is terminated  or revoked sooner.       Influenza A by PCR NEGATIVE NEGATIVE Final   Influenza B by PCR NEGATIVE NEGATIVE Final    Comment: (NOTE) The Xpert Xpress SARS-CoV-2/FLU/RSV plus assay is intended as an aid in the diagnosis of influenza from Nasopharyngeal swab specimens and should not be used as a sole basis for treatment. Nasal washings and aspirates are unacceptable for Xpert Xpress SARS-CoV-2/FLU/RSV testing.  Fact Sheet for Patients: EntrepreneurPulse.com.au  Fact Sheet for Healthcare Providers: IncredibleEmployment.be  This test is not yet approved or cleared by the [place] FDA and has been authorized for detection and/or diagnosis of SARS-CoV-2 by FDA under an Emergency Use Authorization (EUA). This EUA will remain in effect (meaning this test can be used) for the duration of the COVID-19 declaration under Section 564(b)(1) of the Act, 21 U.S.C. section 360bbb-3(b)(1), unless the authorization is terminated or revoked.  Performed at [facility], [place] [address], [place], [place] [address]   Culture, Urine     Status: Abnormal   Collection Time: 02/25/21 11:07 PM   Specimen: Urine, Random  Result Value Ref Range Status   Specimen Description URINE, RANDOM  Final   Special Requests  Final    NONE Performed at [facility], [place] [address] [place],  [place] [address]    Culture (A)  Final    70,000 COLONIES/mL ENTEROCOCCUS FAECALIS 30,000 COLONIES/mL ESCHERICHIA COLI    Report Status 02/28/2021 FINAL  Final   Organism ID, Bacteria ENTEROCOCCUS FAECALIS (A)  Final   Organism ID, Bacteria ESCHERICHIA COLI (A)  Final      Susceptibility   Escherichia coli - MIC*    AMPICILLIN <=2 SENSITIVE Sensitive     CEFAZOLIN <=4 SENSITIVE Sensitive     CEFEPIME <=0.12 SENSITIVE Sensitive     CEFTRIAXONE <=0.25 SENSITIVE Sensitive     CIPROFLOXACIN <=0.25 SENSITIVE Sensitive     GENTAMICIN <=1 SENSITIVE Sensitive     IMIPENEM <=0.25 SENSITIVE Sensitive     NITROFURANTOIN <=16 SENSITIVE Sensitive     TRIMETH/SULFA >=320 RESISTANT Resistant     AMPICILLIN/SULBACTAM <=2 SENSITIVE Sensitive     PIP/TAZO <=4 SENSITIVE Sensitive     * 30,000 COLONIES/mL ESCHERICHIA COLI   Enterococcus faecalis - MIC*    AMPICILLIN <=2 SENSITIVE Sensitive     NITROFURANTOIN <=16 SENSITIVE Sensitive     VANCOMYCIN 1 SENSITIVE Sensitive     * 70,000 COLONIES/mL ENTEROCOCCUS FAECALIS  Culture, blood (routine x 2)     Status: None   Collection Time: 02/26/21 12:34 PM   Specimen: BLOOD RIGHT HAND  Result Value Ref Range Status   Specimen Description BLOOD RIGHT HAND  Final   Special Requests   Final    BOTTLES DRAWN AEROBIC ONLY Blood Culture results may not be optimal due to an inadequate volume of blood received in culture bottles   Culture   Final    NO GROWTH 5 DAYS Performed at [facility], [address] [place], [place] [address]    Report Status 03/03/2021 FINAL  Final  Culture, blood (routine x 2)     Status: None   Collection Time: 02/26/21 12:42 PM   Specimen: BLOOD LEFT HAND  Result Value Ref Range Status   Specimen Description BLOOD LEFT HAND  Final   Special Requests   Final    BOTTLES DRAWN AEROBIC ONLY Blood Culture results may not be optimal due to an inadequate volume of blood received in culture bottles   Culture   Final    NO GROWTH 5  DAYS Performed at [facility], [place] [address], [place], [place] [address]    Report Status 03/03/2021 FINAL  Final  C Difficile Quick Screen (NO PCR Reflex)     Status: None   Collection Time: 02/28/21  1:42 PM   Specimen: STOOL  Result Value Ref Range Status   C Diff antigen NEGATIVE NEGATIVE Final   C Diff toxin NEGATIVE NEGATIVE Final   C Diff interpretation No C. difficile detected.  Final    Comment: Performed at [facility], [place] [address] [place], [place] [address]     Time coordinating discharge: Over 30 minutes  SIGNED:   Charlynne Cousins, MD  [facility] 03/03/2021, 10:37 AM Pager   If 7PM-7AM, please contact night-coverage [email or web address] Password [email or web address]

## 2021-03-03 NOTE — Plan of Care (Signed)

## 2021-03-03 NOTE — Progress Notes (Signed)
Pharmacy Antibiotic Note  Renee Zuniga is a 87 y.o. female admitted on 02/25/2021 with aspiration pneumonia and UTI.  Pharmacy has been consulted to transition Zosyn to Unasyn.   Urine culture is growing Enterococcus and E.coli (both sensitive to Unasyn). Noted concern for aspiration PNA so Unasyn covering for this as well. Today is D#6 of coverage for both UTI/asp PNA.   Plan: - Continue  Unasyn 1.5g IV every 6 hours - Will continue to follow renal function, culture results, LOT, and antibiotic de-escalation plans    Height: 5\' 5"  (165.1 cm) Weight: 72.6 kg (160 lb) IBW/kg (Calculated) : 57  Temp (24hrs), Avg:98.4 F (36.9 C), Min:97.7 F (36.5 C), Max:99.1 F (37.3 C)  Recent Labs  Lab 02/25/21 1255 02/25/21 1327 02/26/21 0221 02/28/21 0456 03/01/21 0355 03/02/21 0357  WBC 7.1  --  8.6 8.5 8.4 7.7  CREATININE 0.79 0.70 0.84 0.80 0.61 0.61    Estimated Creatinine Clearance: 49.4 mL/min (by C-G formula based on SCr of 0.61 mg/dL).    Allergies  Allergen Reactions  . Ambien [Zolpidem] Nausea And Vomiting  . Demerol [Meperidine] Nausea And Vomiting       . Haldol [Haloperidol]     delirium  . Lunesta [Eszopiclone] Other (See Comments)    Affected speech  . Tramadol     Zosyn 3/19 >> 3/21 Unasyn 3/21 >>   3/18 Fluvid >> neg 3/19 BCx >> ngx2d 3/18 UCx >> E. Faecalis (pan-S) + E.coli (pan-S except R-bactrim)  Thank you for allowing pharmacy to be a part of this patient's care.  Alycia Rossetti, PharmD, BCPS Clinical Pharmacist Clinical phone for 03/03/2021: [phone] 03/03/2021 8:39 AM   **Pharmacist phone directory can now be found on [email or web address] (PW [email or web address]).  Listed under [place].

## 2021-03-03 NOTE — TOC Transition Note (Signed)
Transition of Care [facility]) - CM/SW Discharge Note   Patient Details  Name: Renee Zuniga MRN: [id number] Date of Birth: 1931/10/08  Transition of Care [facility] [place] [facility] CM/SW Contact:  Geralynn Ochs, LCSW Phone Number: 03/03/2021, 1:01 PM   Clinical Narrative:   Nurse to call report to [phone], Room 7016.    Final next level of care: Skilled Nursing Facility Barriers to Discharge: Barriers Resolved   Patient Goals and CMS Choice   CMS Medicare.gov Compare Post Acute Care list provided to:: Patient Choice offered to / list presented to : [place]  Discharge Placement              Patient [facility] bed at: [facility] Patient to be transferred to facility by: [place] Name of family member notified: Pennie Banter Patient and family notified of of transfer: 03/03/21  Discharge Plan and Services In-house Referral: Clinical Social Work Discharge Planning Services: CM Consult Post Acute Care Choice: [place]                               Social Determinants of Health (SDOH) Interventions     Readmission Risk Interventions No flowsheet data found.

## 2021-03-03 NOTE — Progress Notes (Signed)
Occupational Therapy Treatment Patient Details Name: Renee Zuniga MRN: [id number] DOB: Oct 31, 1931 Today's Date: 03/03/2021    History of present illness Pt is a 87 y.o. F admitted 3/18 for evaluation of suspected seizure activity with postictal state. EEG without acute seizure activity. Significant PMH: seizure disorder, HTN, HLD, TIA, MCI/dementia.   OT comments  Pt supine in bed and agreeable to OT services.  Completing bed mobility with min-mod assist, transfers and in room mobility with min assist.  Toileting with min assist and grooming at sink standing with min guard assist. She remains pleasantly confused, but following commands with increased time and improved orientation to self and awareness of needs.  Plan for rehab today, OT will continue to follow.    Follow Up Recommendations  SNF;Supervision/Assistance - 24 hour    Equipment Recommendations  Other (comment) (TBD)    Recommendations for Other Services      Precautions / Restrictions Precautions Precautions: Fall Restrictions Weight Bearing Restrictions: No       Mobility Bed Mobility Overal bed mobility: Needs Assistance Bed Mobility: Sit to Supine;Supine to Sit     Supine to sit: Min assist Sit to supine: Mod assist   General bed mobility comments: incresaed time with min assist to initate movement towards EOB and scooting forward; mod assist for BLEs back to supine    Transfers Overall transfer level: Needs assistance Equipment used: Rolling walker (2 wheeled) Transfers: Sit to/from Stand Sit to Stand: Min assist         General transfer comment: min assist to power up and steady from EOB and BSC    Balance Overall balance assessment: Needs assistance Sitting-balance support: Feet supported Sitting balance-Leahy Scale: Good     Standing balance support: Bilateral upper extremity supported;During functional activity Standing balance-Leahy Scale: Poor Standing balance comment: min assist to min  guard for safety, intermittent losses of balance posteriorly                           ADL either performed or assessed with clinical judgement   ADL Overall ADL's : Needs assistance/impaired     Grooming: Min guard;Wash/dry hands;Standing                   Armed forces technical officer: Minimal assistance;Ambulation;RW;BSC Toilet Transfer Details (indicate cue type and reason): 3:1 over commode Toileting- Clothing Manipulation and Hygiene: Minimal assistance;Sit to/from stand Toileting - Clothing Manipulation Details (indicate cue type and reason): clothing mgmt     Functional mobility during ADLs: Minimal assistance;Moderate assistance;Rolling walker;Cueing for sequencing General ADL Comments: limited by weakness, balance, cognition     Vision       Perception     Praxis      Cognition Arousal/Alertness: Awake/alert Behavior During Therapy: WFL for tasks assessed/performed Overall Cognitive Status: History of cognitive impairments - at baseline                                 General Comments: pt with hx of dementia, following commands with increased time today, able to state her birthday, engaging with therapist today        Exercises     Shoulder Instructions       General Comments daughters present and supportive, no dizziness today with HR rnaged from 90-110    Pertinent Vitals/ Pain       Pain Assessment: No/denies pain  Home Living  Prior Functioning/Environment              Frequency  Min 2X/week        Progress Toward Goals  OT Goals(current goals can now be found in the care plan section)  Progress towards OT goals: Progressing toward goals  Acute Rehab OT Goals Patient Stated Goal: pt daughters would like her to discharge to rehab OT Goal Formulation: With family  Plan Discharge plan remains appropriate;Frequency remains appropriate    Co-evaluation                  AM-PAC OT "6 Clicks" Daily Activity     Outcome Measure   Help from another person eating meals?: A Little Help from another person taking care of personal grooming?: A Little Help from another person toileting, which includes using toliet, bedpan, or urinal?: A Little Help from another person bathing (including washing, rinsing, drying)?: A Lot Help from another person to put on and taking off regular upper body clothing?: A Little Help from another person to put on and taking off regular lower body clothing?: A Lot 6 Click Score: 16    End of Session Equipment Utilized During Treatment: Rolling walker;Gait belt  OT Visit Diagnosis: Other abnormalities of gait and mobility (R26.89);Muscle weakness (generalized) (M62.81);Other symptoms and signs involving cognitive function   Activity Tolerance Patient tolerated treatment well   Patient Left in bed;with call bell/phone within reach;with family/visitor present   Nurse Communication Mobility status        Time: 3704-8889 OT Time Calculation (min): 21 min  Charges: OT General Charges $OT Visit: 1 Visit OT Treatments $Self Care/Home Management : 8-22 mins  Jolaine Artist, OT [place] Pager [phone] Office [phone]    Renee Zuniga 03/03/2021, 12:18 PM

## 2021-03-04 DIAGNOSIS — E039 Hypothyroidism, unspecified: Secondary | ICD-10-CM | POA: Diagnosis not present

## 2021-03-04 DIAGNOSIS — I10 Essential (primary) hypertension: Secondary | ICD-10-CM | POA: Diagnosis not present

## 2021-03-04 DIAGNOSIS — R531 Weakness: Secondary | ICD-10-CM | POA: Diagnosis not present

## 2021-03-04 DIAGNOSIS — R569 Unspecified convulsions: Secondary | ICD-10-CM | POA: Diagnosis not present

## 2021-03-04 DIAGNOSIS — N3000 Acute cystitis without hematuria: Secondary | ICD-10-CM | POA: Diagnosis not present

## 2021-03-07 ENCOUNTER — Encounter: Payer: Medicare Other | Admitting: Surgery

## 2021-03-07 ENCOUNTER — Ambulatory Visit (HOSPITAL_COMMUNITY): Payer: Medicare Other | Attending: Surgery

## 2021-03-09 ENCOUNTER — Ambulatory Visit: Payer: Medicare Other | Admitting: Physician Assistant

## 2021-03-11 DIAGNOSIS — I4891 Unspecified atrial fibrillation: Secondary | ICD-10-CM | POA: Diagnosis not present

## 2021-03-14 ENCOUNTER — Other Ambulatory Visit: Payer: Medicare Other

## 2021-03-14 ENCOUNTER — Telehealth: Payer: Self-pay | Admitting: Orthopedic Surgery

## 2021-03-14 NOTE — Telephone Encounter (Signed)
Patient's daughter called. She would like to know the name of the socks with the silver in them that Dr. Sharol Given made. Her call back number is [phone]

## 2021-03-16 ENCOUNTER — Other Ambulatory Visit: Payer: Medicare Other

## 2021-03-16 NOTE — Telephone Encounter (Signed)
I called pt's daughter and gave the information for Vive socks, wear to purchase locally and how to order on line with promo code for 20% discount.

## 2021-03-18 DIAGNOSIS — R350 Frequency of micturition: Secondary | ICD-10-CM | POA: Diagnosis not present

## 2021-03-18 DIAGNOSIS — R300 Dysuria: Secondary | ICD-10-CM | POA: Diagnosis not present

## 2021-03-19 DIAGNOSIS — N390 Urinary tract infection, site not specified: Secondary | ICD-10-CM | POA: Diagnosis not present

## 2021-03-22 DIAGNOSIS — N390 Urinary tract infection, site not specified: Secondary | ICD-10-CM | POA: Diagnosis not present

## 2021-03-22 DIAGNOSIS — R350 Frequency of micturition: Secondary | ICD-10-CM | POA: Diagnosis not present

## 2021-03-22 DIAGNOSIS — N3020 Other chronic cystitis without hematuria: Secondary | ICD-10-CM | POA: Diagnosis not present

## 2021-03-23 DIAGNOSIS — M25561 Pain in right knee: Secondary | ICD-10-CM | POA: Diagnosis not present

## 2021-03-24 DIAGNOSIS — M170 Bilateral primary osteoarthritis of knee: Secondary | ICD-10-CM | POA: Diagnosis not present

## 2021-03-24 DIAGNOSIS — M25551 Pain in right hip: Secondary | ICD-10-CM | POA: Diagnosis not present

## 2021-03-29 ENCOUNTER — Encounter (HOSPITAL_COMMUNITY): Payer: Self-pay | Admitting: Emergency Medicine

## 2021-03-29 ENCOUNTER — Inpatient Hospital Stay (HOSPITAL_COMMUNITY)
Admission: EM | Admit: 2021-03-29 | Discharge: 2021-03-31 | DRG: 378 | Disposition: A | Discharge status: Hospice | Payer: Medicare Other | Source: Skilled Nursing Facility | Attending: Family Medicine | Admitting: Family Medicine

## 2021-03-29 ENCOUNTER — Emergency Department (HOSPITAL_COMMUNITY): Payer: Medicare Other

## 2021-03-29 ENCOUNTER — Other Ambulatory Visit: Payer: Self-pay

## 2021-03-29 DIAGNOSIS — Z66 Do not resuscitate: Secondary | ICD-10-CM | POA: Diagnosis present

## 2021-03-29 DIAGNOSIS — E039 Hypothyroidism, unspecified: Secondary | ICD-10-CM | POA: Diagnosis present

## 2021-03-29 DIAGNOSIS — E871 Hypo-osmolality and hyponatremia: Secondary | ICD-10-CM | POA: Diagnosis present

## 2021-03-29 DIAGNOSIS — W19XXXA Unspecified fall, initial encounter: Secondary | ICD-10-CM | POA: Diagnosis present

## 2021-03-29 DIAGNOSIS — Z79899 Other long term (current) drug therapy: Secondary | ICD-10-CM

## 2021-03-29 DIAGNOSIS — F0390 Unspecified dementia without behavioral disturbance: Secondary | ICD-10-CM | POA: Diagnosis present

## 2021-03-29 DIAGNOSIS — Z8673 Personal history of transient ischemic attack (TIA), and cerebral infarction without residual deficits: Secondary | ICD-10-CM | POA: Diagnosis not present

## 2021-03-29 DIAGNOSIS — Z8719 Personal history of other diseases of the digestive system: Secondary | ICD-10-CM

## 2021-03-29 DIAGNOSIS — S8001XA Contusion of right knee, initial encounter: Secondary | ICD-10-CM | POA: Diagnosis present

## 2021-03-29 DIAGNOSIS — D649 Anemia, unspecified: Secondary | ICD-10-CM | POA: Diagnosis not present

## 2021-03-29 DIAGNOSIS — Z8249 Family history of ischemic heart disease and other diseases of the circulatory system: Secondary | ICD-10-CM | POA: Diagnosis not present

## 2021-03-29 DIAGNOSIS — Z20822 Contact with and (suspected) exposure to covid-19: Secondary | ICD-10-CM | POA: Diagnosis present

## 2021-03-29 DIAGNOSIS — I4891 Unspecified atrial fibrillation: Secondary | ICD-10-CM | POA: Diagnosis not present

## 2021-03-29 DIAGNOSIS — M1711 Unilateral primary osteoarthritis, right knee: Secondary | ICD-10-CM | POA: Diagnosis not present

## 2021-03-29 DIAGNOSIS — R569 Unspecified convulsions: Secondary | ICD-10-CM | POA: Diagnosis not present

## 2021-03-29 DIAGNOSIS — I499 Cardiac arrhythmia, unspecified: Secondary | ICD-10-CM | POA: Diagnosis not present

## 2021-03-29 DIAGNOSIS — K922 Gastrointestinal hemorrhage, unspecified: Secondary | ICD-10-CM | POA: Diagnosis not present

## 2021-03-29 DIAGNOSIS — G40009 Localization-related (focal) (partial) idiopathic epilepsy and epileptic syndromes with seizures of localized onset, not intractable, without status epilepticus: Secondary | ICD-10-CM | POA: Diagnosis present

## 2021-03-29 DIAGNOSIS — I10 Essential (primary) hypertension: Secondary | ICD-10-CM | POA: Diagnosis present

## 2021-03-29 DIAGNOSIS — R0902 Hypoxemia: Secondary | ICD-10-CM | POA: Diagnosis not present

## 2021-03-29 DIAGNOSIS — K921 Melena: Secondary | ICD-10-CM | POA: Diagnosis not present

## 2021-03-29 DIAGNOSIS — D509 Iron deficiency anemia, unspecified: Secondary | ICD-10-CM | POA: Diagnosis not present

## 2021-03-29 DIAGNOSIS — R000 Tachycardia, unspecified: Secondary | ICD-10-CM | POA: Diagnosis not present

## 2021-03-29 DIAGNOSIS — F0280 Dementia in other diseases classified elsewhere without behavioral disturbance: Secondary | ICD-10-CM | POA: Diagnosis not present

## 2021-03-29 DIAGNOSIS — R0602 Shortness of breath: Secondary | ICD-10-CM | POA: Diagnosis not present

## 2021-03-29 DIAGNOSIS — Z79890 Hormone replacement therapy (postmenopausal): Secondary | ICD-10-CM

## 2021-03-29 DIAGNOSIS — F0391 Unspecified dementia with behavioral disturbance: Secondary | ICD-10-CM | POA: Diagnosis not present

## 2021-03-29 DIAGNOSIS — Z515 Encounter for palliative care: Secondary | ICD-10-CM | POA: Diagnosis not present

## 2021-03-29 DIAGNOSIS — N179 Acute kidney failure, unspecified: Secondary | ICD-10-CM | POA: Diagnosis present

## 2021-03-29 DIAGNOSIS — M2550 Pain in unspecified joint: Secondary | ICD-10-CM | POA: Diagnosis not present

## 2021-03-29 DIAGNOSIS — Z789 Other specified health status: Secondary | ICD-10-CM

## 2021-03-29 DIAGNOSIS — Z7401 Bed confinement status: Secondary | ICD-10-CM | POA: Diagnosis not present

## 2021-03-29 DIAGNOSIS — I959 Hypotension, unspecified: Secondary | ICD-10-CM | POA: Diagnosis present

## 2021-03-29 DIAGNOSIS — Z7189 Other specified counseling: Secondary | ICD-10-CM | POA: Diagnosis not present

## 2021-03-29 DIAGNOSIS — R634 Abnormal weight loss: Secondary | ICD-10-CM | POA: Diagnosis present

## 2021-03-29 DIAGNOSIS — R5381 Other malaise: Secondary | ICD-10-CM | POA: Diagnosis not present

## 2021-03-29 LAB — ABO/RH: ABO/RH(D): A POS

## 2021-03-29 LAB — COMPREHENSIVE METABOLIC PANEL
ALT: 26 U/L (ref 0–44)
AST: 26 U/L (ref 15–41)
Albumin: 3.7 g/dL (ref 3.5–5.0)
Alkaline Phosphatase: 38 U/L (ref 38–126)
Anion gap: 12 (ref 5–15)
BUN: 59 mg/dL — ABNORMAL HIGH (ref 8–23)
CO2: 14 mmol/L — ABNORMAL LOW (ref 22–32)
Calcium: 9.2 mg/dL (ref 8.9–10.3)
Chloride: 104 mmol/L (ref 98–111)
Creatinine, Ser: 1.19 mg/dL — ABNORMAL HIGH (ref 0.44–1.00)
GFR, Estimated: 44 mL/min — ABNORMAL LOW (ref >60)
Glucose, Bld: 119 mg/dL — ABNORMAL HIGH (ref 70–99)
Potassium: 5 mmol/L (ref 3.5–5.1)
Sodium: 130 mmol/L — ABNORMAL LOW (ref 135–145)
Total Bilirubin: 0.9 mg/dL (ref 0.3–1.2)
Total Protein: 6 g/dL — ABNORMAL LOW (ref 6.5–8.1)

## 2021-03-29 LAB — TYPE AND SCREEN
ABO/RH(D): A POS
Antibody Screen: NEGATIVE

## 2021-03-29 LAB — CBC WITH DIFFERENTIAL/PLATELET
Abs Immature Granulocytes: 0.51 10*3/uL — ABNORMAL HIGH (ref 0.00–0.07)
Basophils Absolute: 0 10*3/uL (ref 0.0–0.1)
Basophils Relative: 0 %
Eosinophils Absolute: 0 10*3/uL (ref 0.0–0.5)
Eosinophils Relative: 0 %
HCT: 27.7 % — ABNORMAL LOW (ref 36.0–46.0)
Hemoglobin: 8.3 g/dL — ABNORMAL LOW (ref 12.0–15.0)
Immature Granulocytes: 3 %
Lymphocytes Relative: 10 %
Lymphs Abs: 1.6 10*3/uL (ref 0.7–4.0)
MCH: 31 pg (ref 26.0–34.0)
MCHC: 30 g/dL (ref 30.0–36.0)
MCV: 103.4 fL — ABNORMAL HIGH (ref 80.0–100.0)
Monocytes Absolute: 1.6 10*3/uL — ABNORMAL HIGH (ref 0.1–1.0)
Monocytes Relative: 10 %
Neutro Abs: 12.5 10*3/uL — ABNORMAL HIGH (ref 1.7–7.7)
Neutrophils Relative %: 77 %
Platelets: 257 10*3/uL (ref 150–400)
RBC: 2.68 MIL/uL — ABNORMAL LOW (ref 3.87–5.11)
RDW: 14.3 % (ref 11.5–15.5)
WBC: 16.2 10*3/uL — ABNORMAL HIGH (ref 4.0–10.5)
nRBC: 0.2 % (ref 0.0–0.2)

## 2021-03-29 LAB — RESP PANEL BY RT-PCR (FLU A&B, COVID) ARPGX2
Influenza A by PCR: NEGATIVE
Influenza B by PCR: NEGATIVE
SARS Coronavirus 2 by RT PCR: NEGATIVE

## 2021-03-29 LAB — POC OCCULT BLOOD, ED: Fecal Occult Bld: POSITIVE — AB

## 2021-03-29 LAB — TROPONIN I (HIGH SENSITIVITY): Troponin I (High Sensitivity): 19 ng/L — ABNORMAL HIGH (ref <18)

## 2021-03-29 LAB — PROTIME-INR
INR: 1.5 — ABNORMAL HIGH (ref 0.8–1.2)
Prothrombin Time: 18.4 seconds — ABNORMAL HIGH (ref 11.4–15.2)

## 2021-03-29 LAB — MAGNESIUM: Magnesium: 2 mg/dL (ref 1.7–2.4)

## 2021-03-29 LAB — TSH: TSH: 3.23 u[IU]/mL (ref 0.350–4.500)

## 2021-03-29 MED ORDER — ESCITALOPRAM OXALATE 10 MG PO TABS
10.0000 mg | ORAL_TABLET | Freq: Every day | ORAL | Status: DC
  Administered 2021-03-30 – 2021-03-31 (×2): 10 mg via ORAL
  Filled 2021-03-29 (×2): qty 1

## 2021-03-29 MED ORDER — METOPROLOL SUCCINATE ER 25 MG PO TB24
25.0000 mg | ORAL_TABLET | Freq: Every day | ORAL | Status: DC
  Administered 2021-03-30 – 2021-03-31 (×2): 25 mg via ORAL
  Filled 2021-03-29 (×3): qty 1

## 2021-03-29 MED ORDER — DILTIAZEM HCL-DEXTROSE 125-5 MG/125ML-% IV SOLN (PREMIX)
5.0000 mg/h | INTRAVENOUS | Status: DC
  Administered 2021-03-29: 5 mg/h via INTRAVENOUS
  Filled 2021-03-29: qty 125

## 2021-03-29 MED ORDER — SODIUM CHLORIDE 0.9 % IV SOLN: INTRAVENOUS | Status: DC

## 2021-03-29 MED ORDER — ACETAMINOPHEN 650 MG RE SUPP: 650.0000 mg | Freq: Four times a day (QID) | RECTAL | Status: DC | PRN

## 2021-03-29 MED ORDER — PRAMIPEXOLE DIHYDROCHLORIDE 0.25 MG PO TABS
0.3750 mg | ORAL_TABLET | Freq: Every day | ORAL | Status: DC
  Administered 2021-03-29 – 2021-03-30 (×2): 0.375 mg via ORAL
  Filled 2021-03-29 (×3): qty 1

## 2021-03-29 MED ORDER — SODIUM CHLORIDE 0.9 % IV SOLN
80.0000 mg | Freq: Once | INTRAVENOUS | Status: DC
  Filled 2021-03-29: qty 80

## 2021-03-29 MED ORDER — DILTIAZEM LOAD VIA INFUSION
20.0000 mg | Freq: Once | INTRAVENOUS | Status: AC
  Administered 2021-03-29: 20 mg via INTRAVENOUS
  Filled 2021-03-29: qty 20

## 2021-03-29 MED ORDER — RIVASTIGMINE TARTRATE 1.5 MG PO CAPS
1.5000 mg | ORAL_CAPSULE | Freq: Two times a day (BID) | ORAL | Status: DC
  Administered 2021-03-29: 1.5 mg via ORAL
  Filled 2021-03-29 (×2): qty 1

## 2021-03-29 MED ORDER — ACETAMINOPHEN 325 MG PO TABS: 650.0000 mg | ORAL_TABLET | Freq: Four times a day (QID) | ORAL | Status: DC | PRN

## 2021-03-29 MED ORDER — LEVETIRACETAM 500 MG PO TABS
1500.0000 mg | ORAL_TABLET | Freq: Two times a day (BID) | ORAL | Status: DC
  Administered 2021-03-29 – 2021-03-31 (×4): 1500 mg via ORAL
  Filled 2021-03-29 (×4): qty 3

## 2021-03-29 MED ORDER — LORAZEPAM 2 MG/ML IJ SOLN: 0.5000 mg | INTRAMUSCULAR | Status: DC | PRN

## 2021-03-29 MED ORDER — SODIUM CHLORIDE 0.9 % IV BOLUS
1000.0000 mL | Freq: Once | INTRAVENOUS | Status: AC
  Administered 2021-03-29: 1000 mL via INTRAVENOUS

## 2021-03-29 MED ORDER — SODIUM CHLORIDE 0.9 % IV SOLN
8.0000 mg/h | INTRAVENOUS | Status: DC
  Administered 2021-03-29: 8 mg/h via INTRAVENOUS
  Filled 2021-03-29 (×2): qty 80

## 2021-03-29 MED ORDER — LEVOTHYROXINE SODIUM 50 MCG PO TABS
50.0000 ug | ORAL_TABLET | Freq: Every day | ORAL | Status: DC
  Administered 2021-03-30: 50 ug via ORAL
  Filled 2021-03-29: qty 1

## 2021-03-29 NOTE — ED Triage Notes (Addendum)
Emergency Medicine Provider Triage Evaluation Note  RAVYN NIKKEL , a 87 y.o. female  was evaluated in triage.  Pt complains of knee brusing after questionably falling. Is on thinners. Pt has dementia. She is unsure why she is here. Nursing home wanted her here for eval.   Review of Systems  Positive: Knee brusing Negative: Pain  Physical Exam  There were no vitals taken for this visit. Gen:   Awake, no distress   HEENT:  Atraumatic  Resp:  Normal effort  Cardiac:  Tachy Abd:   Nondistended, nontender MSK:   Bruising to R knee, normal range of motion to knee. NO pain. Normal strength  Neuro:  Speech clear   Medical Decision Making  Medically screening exam initiated at 12:32 PM.  Appropriate orders placed.  RAYNELL UPTON was informed that the remainder of the evaluation will be completed by another provider, this initial triage assessment does not replace that evaluation, and the importance of remaining in the ED until their evaluation is complete.  Clinical Impression  MSE was initiated and I personally evaluated the patient and placed orders (if any) at  12:35 PM on March 29, 2021.  The patient appears stable so that the remainder of the MSE may be completed by another provider.  Notified HR is 140, on rpeat HR 123. Pt denies any CP however will obtain labs and EKG at this time.    Alfredia Client, PA-C 03/29/21 1258

## 2021-03-29 NOTE — ED Notes (Signed)
Assisted in rectal exam tolerated well

## 2021-03-29 NOTE — Consult Note (Addendum)
Reason for Consult: GI bleed Referring Physician: [facility]  [name] HPI: This is an 87 year old female with a PMh of seizure disorder, TIA, HTN, atrial fibrillation, and dementia admitted for a GI bleed.  The patient's daughters report that her abdominal symptoms started last Wednesday.  She reported feeling some abdominal discomfort and it was associated with nausea.  The thought was that these symptoms were secondary to the recent hospitalization, move to rehab, and then to a nursing facility.  In the past, with anxiety, she reported problems with abdominal pain and nausea.  This past Sunday she started to feel worse and she was noted by her daughter Krishana Lutze that she appeared pasty/ashen.  The patient also remarked that she was feeling weak.  Yesterday she was in the shower and her caregiver noted that she was pale and she was shaking.  One of her other daughters did report that she had multiple dark soft bowel movements.  In the ER she was found to have a drop in her HGB from 10.7 g/dL (03/02/2021) down to 8.3 g/dL (03/29/2021).  She was hemoccult positive.  The patient's baseline HGB ranges from 12-13 g/dL.  In the past she was evaluated by Dr. Cristina Gong routine colonoscopies and she has a history of polyps.  Her last colonoscopy was reported to be 11 years ago.  Currently the patient denies any issues with abdominal pain or any GERD issues.  She does not recall a prior EGD.  There is a reported 12 lbs weight loss since her hospitalization for her seizure.  At a baseline she has an excellent appetite, but of late her PO intake declined.  Past Medical History:  Diagnosis Date  . Arthritis   . Heart murmur   . High blood pressure   . HTN (hypertension) 11/09/2014  . Hypercholesteremia   . Insomnia   . Localization-related idiopathic epilepsy and epileptic syndromes with seizures of localized onset, not intractable, without status epilepticus ([place]) 08/12/2015  . Migraine headache   . Mild  neurocognitive disorder, amnestic type 08/22/2017  . Mitral valve prolapse   . Osteoarthritis   . Osteopenia   . Pelvis fracture ([place]) 11/26/2018  . Seizures ([place])   . TIA (transient ischemic attack) 11/09/2014  . Tinnitus   . Vitamin D deficiency     Past Surgical History:  Procedure Laterality Date  . CHOLECYSTECTOMY    . CORRECTION HAMMER TOE Bilateral   . GALLBLADDER SURGERY    . OOPHORECTOMY     benign tumor, per patient  . PARTIAL HYSTERECTOMY  1974   due to uterine prolaspe    Family History  Problem Relation Age of Onset  . Mitral valve prolapse Father   . Heart Problems Mother   . Hypertension Mother     Social History:  reports that she has never smoked. She has never used smokeless tobacco. She reports current alcohol use of about 7.0 standard drinks of alcohol per week. She reports that she does not use drugs.  Allergies:  Allergies  Allergen Reactions  . Ambien [Zolpidem] Nausea And Vomiting  . Demerol [Meperidine] Nausea And Vomiting       . Haldol [Haloperidol]     delirium  . Lunesta [Eszopiclone] Other (See Comments)    Affected speech  . Tramadol     Medications:  Scheduled: . diltiazem  20 mg Intravenous Once   Continuous: . sodium chloride     And  . sodium chloride    .  diltiazem (CARDIZEM) infusion    . pantoprozole (PROTONIX) infusion    . pantoprazole (PROTONIX) 80 mg IVPB      Results for orders placed or performed during the hospital encounter of 03/29/21 (from the past 24 hour(s))  Comprehensive metabolic panel     Status: Abnormal   Collection Time: 03/29/21  1:05 PM  Result Value Ref Range   Sodium 130 (L) 135 - 145 mmol/L   Potassium 5.0 3.5 - 5.1 mmol/L   Chloride 104 98 - 111 mmol/L   CO2 14 (L) 22 - 32 mmol/L   Glucose, Bld 119 (H) 70 - 99 mg/dL   BUN 59 (H) 8 - 23 mg/dL   Creatinine, Ser 1.19 (H) 0.44 - 1.00 mg/dL   Calcium 9.2 8.9 - 10.3 mg/dL   Total Protein 6.0 (L) 6.5 - 8.1 g/dL   Albumin 3.7 3.5 - 5.0 g/dL    AST 26 15 - 41 U/L   ALT 26 0 - 44 U/L   Alkaline Phosphatase 38 38 - 126 U/L   Total Bilirubin 0.9 0.3 - 1.2 mg/dL   GFR, Estimated 44 (L) >60 mL/min   Anion gap 12 5 - 15  CBC with Differential     Status: Abnormal   Collection Time: 03/29/21  1:05 PM  Result Value Ref Range   WBC 16.2 (H) 4.0 - 10.5 K/uL   RBC 2.68 (L) 3.87 - 5.11 MIL/uL   Hemoglobin 8.3 (L) 12.0 - 15.0 g/dL   HCT 27.7 (L) 36.0 - 46.0 %   MCV 103.4 (H) 80.0 - 100.0 fL   MCH 31.0 26.0 - 34.0 pg   MCHC 30.0 30.0 - 36.0 g/dL   RDW 14.3 11.5 - 15.5 %   Platelets 257 150 - 400 K/uL   nRBC 0.2 0.0 - 0.2 %   Neutrophils Relative % 77 %   Neutro Abs 12.5 (H) 1.7 - 7.7 K/uL   Lymphocytes Relative 10 %   Lymphs Abs 1.6 0.7 - 4.0 K/uL   Monocytes Relative 10 %   Monocytes Absolute 1.6 (H) 0.1 - 1.0 K/uL   Eosinophils Relative 0 %   Eosinophils Absolute 0.0 0.0 - 0.5 K/uL   Basophils Relative 0 %   Basophils Absolute 0.0 0.0 - 0.1 K/uL   Immature Granulocytes 3 %   Abs Immature Granulocytes 0.51 (H) 0.00 - 0.07 K/uL  Type and screen [place]     Status: None (Preliminary result)   Collection Time: 03/29/21  1:25 PM  Result Value Ref Range   ABO/RH(D) PENDING    Antibody Screen PENDING    Sample Expiration      04/01/2021,2359 Performed at [facility], [address] [place], [place] [address]   ABO/Rh     Status: None (Preliminary result)   Collection Time: 03/29/21  1:28 PM  Result Value Ref Range   ABO/RH(D) PENDING   POC occult blood, ED Provider will collect     Status: Abnormal   Collection Time: 03/29/21  2:03 PM  Result Value Ref Range   Fecal Occult Bld POSITIVE (A) NEGATIVE     DG Chest Port 1 View  Result Date: 03/29/2021 CLINICAL DATA:  Shortness of breath. EXAM: PORTABLE CHEST 1 VIEW COMPARISON:  Chest x-ray dated February 26, 2021. FINDINGS: The heart size and mediastinal contours are within normal limits. Both lungs are clear. The visualized skeletal structures are  unremarkable. IMPRESSION: No active disease. Electronically Signed   By: Titus Dubin  M.D.   On: 03/29/2021 14:12   DG Knee Complete 4 Views Right  Result Date: 03/29/2021 CLINICAL DATA:  Fall with right knee EXAM: RIGHT KNEE - COMPLETE 4+ VIEW COMPARISON:  None. FINDINGS: No evidence of fracture, dislocation, or joint effusion. Tricompartment degenerative change most notably in the medial and patellofemoral compartments. Soft tissues are unremarkable. IMPRESSION: Tricompartment degenerative change, no acute osseous abnormality. Electronically Signed   By: Dahlia Bailiff MD   On: 03/29/2021 13:19    ROS:  As stated above in the HPI otherwise negative.  Blood pressure 115/67, pulse (!) 120, temperature (!) 97.5 F (36.4 C), temperature source Oral, resp. rate 16, SpO2 98 %.    PE: Gen: NAD, Alert HEENT:  [place]/AT, EOMI Neck: Supple, no LAD Lungs: CTA Bilaterally CV: Irreg, Irreg ABD: Soft, NTND, +BS Ext: No C/C/E  Assessment/Plan: 1) Upper GI bleed. 2) Anemia. 3) Heme positive stool. 4) Dementia. 5) Weight loss.   The clinical presentation is consistent with an upper GI bleed.  The examination was negative for any abdominal pain with deep palpation.  She is hemodynamically stable.  Further evaluation with an EGD will be performed tomorrow.  If an upper source is not identified, discussions will be made about performing a colonoscopy.  Plan: 1) EGD tomorrow. 2) Follow HGB and transfuse as necessary. 3) Continue with Protonix. 4) Okay with a clear liquid diet and then NPO after midnight.   ADDENDUM: I spoke with the patient's daughters Esthefany Herrig and [name] with regards to their mother.  The plan is for full comfort measures, which is a reasonable course of action.  An EGD can be beneficial in the short term, but it appears that her health is rapidly declining over these past several months.  The procedure will be cancelled.  Natania Finigan D 03/29/2021, 2:56 PM

## 2021-03-29 NOTE — ED Triage Notes (Signed)
Pt here from nursing home with c/o bruising to the right knee , pt is on blood thinners , unknown if she fell , pt has dementia at baseline

## 2021-03-29 NOTE — ED Notes (Signed)
Orthostatics charted lying sitting standing

## 2021-03-29 NOTE — Consult Note (Signed)
Consultation Note Date: 03/29/2021   Patient Name: Renee Zuniga  DOB: [birth year]  MRN: [id number]  Age / Sex: 87 y.o., female  PCP: Seward Carol, MD Referring Physician: Oren Binet*  Reason for Consultation: Establishing goals of care  HPI/Patient Profile: 87 y.o. female  with past medical history of TIA, seizures, dementia presented to [facility] ED on 03/29/2021 from nursing home with complaints of bruising to right knee -it is unknown if she fell.  Patient was admitted on 03/29/2021 with GI bleed, hyponatremia, A. fib with RVR, and AKI.   ED Course:  The patient was noted to be afebrile, have tachycardia to 138 and have low normal blood pressures.  EKG revealed that patient is in A. fib with RVR.  Hemoglobin today was noted to be 8.3 which is down from 10.7 on March 23, her last admission. Patient's daughter Renee Zuniga who came with her mother requested GI evaluation by Dr. Benson Norway who evaluated the patient and recommended Protonix with EGD in the morning.  Patient was started on a Protonix drip and a diltiazem drip for management of RVR.  I subsequently spoke with [name] who noted that they would like a palliative care consult, would not like to pursue EGD or colonoscopy and would like to undergo conservative management with blood transfusions if warranted.    Of note, patient was recently seen by PMT during her last hospitalization from 3/18 - 03/03/2021.  Patient is also followed by outpatient palliative care.  Clinical Assessment and Goals of Care: I have reviewed medical records including EPIC notes, labs, and imaging. Received report from primary RN -no acute concerns.  Went to visit patient at bedside -daughter/Renee Zuniga was present. Patient was lying in bed awake, alert, oriented but confused at times, and able to participate in conversation. She is not able to make complex medical decisions.  No signs or  non-verbal gestures of pain or discomfort noted. No respiratory distress, increased work of breathing, or secretions noted.  Patient denies pain or shortness of breath.  She does repeatedly state "I want to go home."  Met with daughter/Renee Zuniga/HCPOA to discuss diagnosis, prognosis, GOC, EOL wishes, disposition, and options.  [name] is familiar with PMT services.  I re-introduced Palliative Medicine as specialized medical care for people living with serious illness. It focuses on providing relief from the symptoms and stress of a serious illness. The goal is to improve quality of life for both the patient and the family.  We discussed patient's interval history since discharge last month.  Renee Zuniga states patient is now "transformably different" than she was 1 month ago.  After patient's last hospitalization she was discharged to rehab and was there for 2 weeks.  After rehab patient was moved to [facility] assisted living/memory care, which is where she has been since last Friday.  Renee Zuniga states the patient has been increasingly scared and stating she wants to go home and also has become combative and resistant to care.  Prior to this hospitalization [name]  explains patient was able to ambulate independently as well as dress/bathe herself; however, since Sunday 4/19 patient has been increasingly weak.  Renee Zuniga tells me that for the last 1 week patient's appetite has been poor stating that the patient has been nauseous and saying that her food does not taste good.   We discussed patient's current illness and what it means in the larger context of patient's on-going co-morbidities.  Renee Zuniga has a clear understanding of the patient's current acute medical situation.  She also understands that dementia is a progressive, non-curable disease underlying the patient's current acute medical conditions. Natural disease trajectory and expectations at EOL were discussed. I attempted to elicit values  and goals of care important to the patient. The difference between aggressive medical intervention and comfort care was considered in light of the patient's goals of care.  Renee Zuniga is clear that the patient would not want any extraordinary measures to prolong her life.  Renee Zuniga does not wish for patient to pursue EGD or any other invasive procedures.  We discussed monitoring lab work and blood transfusions in context of GI bleed without intervention.  We talked about transition to comfort measures in house and what that would entail inclusive of medications to control pain, dyspnea, agitation, nausea, itching, and hiccups. We discussed stopping all unnecessary measures such as blood draws, needle sticks, oxygen, antibiotics, CBGs/insulin, cardiac monitoring, and frequent vital signs. Renee Zuniga is clear that her goal now is to keep the patient comfortable without life-prolonging interventions; however, she also would like to keep the patient stable until family can come visit from out of town.  She would like to continue metoprolol and Protonix drip and was originally agreeable to discontinue IV fluids; however, with patient's new hypotension is okay to leave IV fluids infusing until family arrive from out of town.  She does not wish for blood transfusion.  Renee Zuniga is very familiar with hospice services.  We discussed discharge options to include residential hospice versus home hospice (at patient's home with private duty caregivers versus long-term care facility).  [name] would like patient evaluated for [facility].  Discussed with RN unrestricted visitor status in light of patient being at end-of-life and we are currently keeping her stable for family to visit.  Primary RN was agreeable to unrestricted visitor status.  Discussed with patient/family the importance of continued conversation with each other and the medical providers regarding overall plan of care and treatment options, ensuring  decisions are within the context of the patient's values and GOCs.    Questions and concerns were addressed. The patient/family was encouraged to call with questions and/or concerns. PMT number was provided.   Primary Decision Maker: HCPOA/daughter/Otisha Carolynn Serve    SUMMARY OF RECOMMENDATIONS:  Continue current gentle medical interventions without invasive procedures - no EGD or colonoscopy  HCPOA/daughter/Reylene Anda Kraft is okay to continue IV fluids but does not want to pursue blood transfusions  Daughter's hope is to keep patient stable until family can come from out of town  Continue DNR/DNI as previously documented  Daughter Film/video editor -TOC consult placed  Unrestricted visitor order placed per end-of-life policy  PMT will continue to follow and support holistically  Code Status/Advance Care Planning:  DNR  Palliative Prophylaxis:   Aspiration, Bowel Regimen, Delirium Protocol, Frequent Pain Assessment, Oral Care and Turn Reposition  Additional Recommendations (Limitations, Scope, Preferences):  No Artificial Feeding, No Blood Transfusions and No Tracheostomy  Psycho-social/Spiritual:   Desire for  further Chaplaincy support:no Created space and opportunity for patient and family to express thoughts and feelings regarding patient's current medical situation.   Emotional support and therapeutic listening provided.  Prognosis:   < 2 weeks due to GIB  Discharge Planning: Hospice facility      Primary Diagnoses: Present on Admission: . GI bleed . HTN (hypertension) . Localization-related idiopathic epilepsy and epileptic syndromes with seizures of localized onset, not intractable, without status epilepticus ([place])   I have reviewed the medical record, interviewed the patient and family, and examined the patient. The following aspects are pertinent.  Past Medical History:  Diagnosis Date  . Arthritis   . Heart murmur   . High blood  pressure   . HTN (hypertension) 11/09/2014  . Hypercholesteremia   . Insomnia   . Localization-related idiopathic epilepsy and epileptic syndromes with seizures of localized onset, not intractable, without status epilepticus ([place]) 08/12/2015  . Migraine headache   . Mild neurocognitive disorder, amnestic type 08/22/2017  . Mitral valve prolapse   . Osteoarthritis   . Osteopenia   . Pelvis fracture ([place]) 11/26/2018  . Seizures ([place])   . TIA (transient ischemic attack) 11/09/2014  . Tinnitus   . Vitamin D deficiency    Social History   Socioeconomic History  . Marital status: Married    Spouse name: Timmothy Sours  . Number of children: 5  . Years of education: 16  . Highest education level: Bachelor's degree (e.g., BA, AB, BS)  Occupational History    Comment: Retired  Tobacco Use  . Smoking status: Never Smoker  . Smokeless tobacco: Never Used  Vaping Use  . Vaping Use: Never used  Substance and Sexual Activity  . Alcohol use: Yes    Alcohol/week: 7.0 standard drinks    Types: 7 Glasses of wine per week    Comment: 1 glass of wine nightly with dinner  . Drug use: No  . Sexual activity: Not Currently  Other Topics Concern  . Not on file  Social History Narrative   Patient is retired and lives at home with her husband Timmothy Sours). Patient has college education.    Caffeine- tea - four glasses.   Right handed.   Social Determinants of Health   Financial Resource Strain: Not on file  Food Insecurity: Not on file  Transportation Needs: Not on file  Physical Activity: Not on file  Stress: Not on file  Social Connections: Not on file   Family History  Problem Relation Age of Onset  . Mitral valve prolapse Father   . Heart Problems Mother   . Hypertension Mother    Scheduled Meds: . [START ON 03/30/2021] escitalopram  10 mg Oral Daily  . levETIRAcetam  1,500 mg Oral BID  . [START ON 03/30/2021] levothyroxine  50 mcg Oral Q0600  . [START ON 03/30/2021] metoprolol succinate  25 mg Oral  Daily  . pramipexole  0.375 mg Oral QHS  . rivastigmine  1.5 mg Oral BID   Continuous Infusions: . sodium chloride 125 mL/hr at 03/29/21 1724  . pantoprozole (PROTONIX) infusion 8 mg/hr (03/29/21 1619)  . pantoprazole (PROTONIX) 80 mg IVPB     PRN Meds:.acetaminophen **OR** acetaminophen, LORazepam Medications Prior to Admission:  Prior to Admission medications   Medication Sig Start Date End Date Taking? Authorizing Provider  acetaminophen (TYLENOL) 325 MG tablet Take 2 tablets (650 mg total) by mouth every 6 (six) hours as needed for mild pain (or Fever >/= 101). 11/29/18  Yes Elgergawy, Silver Huguenin, MD  apixaban (ELIQUIS) 5 MG TABS tablet Take 5 mg by mouth 2 (two) times daily.   Yes [provider]  cyclobenzaprine (FLEXERIL) 5 MG tablet Take 1 tablet (5 mg total) by mouth 3 (three) times daily as needed for muscle spasms. 11/29/18  Yes Elgergawy, Silver Huguenin, MD  escitalopram (LEXAPRO) 10 MG tablet Take 1 tablet (10 mg total) by mouth daily. 09/15/20  Yes Cameron Sprang, MD  ezetimibe (ZETIA) 10 MG tablet Take 10 mg by mouth at bedtime.    Yes [provider]  furosemide (LASIX) 20 MG tablet Take 20 mg by mouth as needed for fluid or edema. 08/21/20  Yes [provider]  levETIRAcetam (KEPPRA) 750 MG tablet Take 2 tablets (1,500 mg total) by mouth 2 (two) times daily. 03/03/21  Yes Charlynne Cousins, MD  levothyroxine (SYNTHROID, LEVOTHROID) 50 MCG tablet Take 50 mcg by mouth daily before breakfast.   Yes [provider]  lisinopril (PRINIVIL,ZESTRIL) 10 MG tablet Take 10 mg by mouth daily.   Yes [provider]  LORazepam (ATIVAN) 1 MG tablet TAKE 1 TABLET AS NEEDED FOR SEIZURE. DO NOT TAKE MORE THAN 2 IN 24 HRS. Patient taking differently: Take 1 mg by mouth as needed. FOR SEIZURE. DO NOT TAKE MORE THAN 2 IN 24 HRS. 09/15/20  Yes Cameron Sprang, MD  metoprolol succinate (TOPROL-XL) 25 MG 24 hr tablet Take 25 mg by mouth daily. Hold for SBP less  than or equal to 100, or heart rate less than or equal 50.   Yes [provider]  Multiple Vitamins-Minerals (MULTIVITAMIN WITH MINERALS) tablet Take 1 tablet by mouth daily.   Yes [provider]  naproxen sodium (ALEVE) 220 MG tablet Take 440 mg by mouth at bedtime.   Yes [provider]  pentoxifylline (TRENTAL) 400 MG CR tablet Take 1 tablet (400 mg total) by mouth 3 (three) times daily with meals. 12/30/20  Yes Newt Minion, MD  pramipexole (MIRAPEX) 0.125 MG tablet Take 0.375 mg by mouth at bedtime.   Yes [provider]  rivastigmine (EXELON) 1.5 MG capsule TAKE (1) CAPSULE TWICE DAILY. Patient taking differently: Take 1.5 mg by mouth 2 (two) times daily. 12/27/20  Yes Cameron Sprang, MD  nitrofurantoin (MACRODANTIN) 100 MG capsule Take 100 mg by mouth daily. Patient not taking: No sig reported 02/22/21   [provider]   Allergies  Allergen Reactions  . Ambien [Zolpidem] Nausea And Vomiting  . Demerol [Meperidine] Nausea And Vomiting       . Haldol [Haloperidol]     delirium  . Lunesta [Eszopiclone] Other (See Comments)    Affected speech  . Tramadol    Review of Systems  Unable to perform ROS: Dementia    Physical Exam Vitals and nursing note reviewed.  Constitutional:      General: She is not in acute distress.    Appearance: She is ill-appearing.  Pulmonary:     Effort: No respiratory distress.  Skin:    General: Skin is warm and dry.     Coloration: Skin is pale.  Neurological:     Mental Status: She is alert and oriented to person, place, and time.     Motor: Weakness present.  Psychiatric:        Attention and Perception: Attention normal.        Mood and Affect: Mood is anxious.        Behavior: Behavior is cooperative.  Cognition and Memory: Cognition and memory normal.     Comments: Confused at times     Vital Signs: BP (!) 87/61 (BP Location: Left Arm)   Pulse 73   Temp 97.6 F (36.4 C) (Oral)    Resp 18   SpO2 99%  Pain Scale: 0-10   Pain Score: 0-No pain   SpO2: SpO2: 99 % O2 Device:SpO2: 99 % O2 Flow Rate: .   IO: Intake/output summary: No intake or output data in the 24 hours ending 03/29/21 1816  LBM:   Baseline Weight:   Most recent weight:       Palliative Assessment/Data: PPS 30%     Time In: 1710 Time Out: 1830 Time Total: 80 minutes Greater than 50%  of this time was spent counseling and coordinating care related to the above assessment and plan.  Signed by: Lin Landsman, NP   Please contact Palliative Medicine Team phone at [phone] for questions and concerns.  For individual provider: See Amion  *Portions of this note are a verbal dictation therefore any spelling and/or grammatical errors are due to the "[place] [employer]" system interpretation.

## 2021-03-29 NOTE — H&P (Signed)
History and Physical:    Renee Zuniga   GUR:[id number] DOB: September 15, 1931 DOA: 03/29/2021  Referring MD/provider: Dr. Gilford Raid PCP: Seward Carol, MD   Patient coming from: [facility]  Chief Complaint: Melena, weakness--history as per patient's 2 daughters Renee Zuniga and Renee Zuniga over the phone as patient does not remember.  History of Present Illness:   Renee Zuniga is an 87 y.o. female with PMH significant for dementia, HTN and recent admission for seizure disorder who was brought in by her daughter for weakness and shortness of breath with "her color is pale" x3 days.  Patient has apparently had decreased p.o. intake over the past couple of days and she has been feeling weak.  Apparently today she was so weak she could not walk even with assistance to dining area and that she was complaining of shortness of breath.  They also note that she has been having dark stools over the past couple of days.  They bring her in for evaluation.  Extensive conversation with patient's power of attorney, daughter Renee Zuniga reveals that palliative care was being pursued both at [facility] and when she was here earlier.  Patient has very clearly stated that she would like to die at home and is ready to go see her husband who had died 18 months earlier.  Renee Zuniga notes that while conservative management is fine, she would not want any further intrusive interventions including EGDs or colonoscopies.  Goal would be to get her mom home as safely as possible and to an appropriate disposition.  Patient herself just states that she is frightened.  She denies chest pain, shortness of breath, abdominal pain or any physical discomfort.  She just states that she is frightened.  ED Course:  The patient was noted to be afebrile, have tachycardia to 138 and have low normal blood pressures.  EKG revealed that patient is in A. fib with RVR.  Hemoglobin today was noted to be 8.3 which is down from 10.7 on March 23, her last  admission. Patient's daughter Renee Zuniga who came with her mother requested GI evaluation by Dr. Benson Norway who evaluated the patient and recommended Protonix with EGD in the morning.  Patient was started on a Protonix drip and a diltiazem drip for management of RVR.  I subsequently spoke with Renee Zuniga who noted that they would like a palliative care consult, would not like to pursue EGD or colonoscopy and would like to undergo conservative management with blood transfusions if warranted.    NB: Patient appears to be on Eliquis which was started at [facility] on April 1 for new diagnosis of atrial fibrillation with RVR at that point.  ROS:   ROS   Review of Systems: Patient unable to provide due to dementia.  Past Medical History:   Past Medical History:  Diagnosis Date  . Arthritis   . Heart murmur   . High blood pressure   . HTN (hypertension) 11/09/2014  . Hypercholesteremia   . Insomnia   . Localization-related idiopathic epilepsy and epileptic syndromes with seizures of localized onset, not intractable, without status epilepticus ([place]) 08/12/2015  . Migraine headache   . Mild neurocognitive disorder, amnestic type 08/22/2017  . Mitral valve prolapse   . Osteoarthritis   . Osteopenia   . Pelvis fracture ([place]) 11/26/2018  . Seizures ([place])   . TIA (transient ischemic attack) 11/09/2014  . Tinnitus   . Vitamin D deficiency     Past Surgical History:  Past Surgical History:  Procedure Laterality Date  . CHOLECYSTECTOMY    . CORRECTION HAMMER TOE Bilateral   . GALLBLADDER SURGERY    . OOPHORECTOMY     benign tumor, per patient  . PARTIAL HYSTERECTOMY  1974   due to uterine prolaspe    Social History:   Social History   Socioeconomic History  . Marital status: Married    Spouse name: Renee Zuniga  . Number of children: 5  . Years of education: 16  . Highest education level: Bachelor's degree (e.g., BA, AB, BS)  Occupational History    Comment: Retired  Tobacco Use  . Smoking status:  Never Smoker  . Smokeless tobacco: Never Used  Vaping Use  . Vaping Use: Never used  Substance and Sexual Activity  . Alcohol use: Yes    Alcohol/week: 7.0 standard drinks    Types: 7 Glasses of wine per week    Comment: 1 glass of wine nightly with dinner  . Drug use: No  . Sexual activity: Not Currently  Other Topics Concern  . Not on file  Social History Narrative   Patient is retired and lives at home with her husband Renee Zuniga). Patient has college education.    Caffeine- tea - four glasses.   Right handed.   Social Determinants of Health   Financial Resource Strain: Not on file  Food Insecurity: Not on file  Transportation Needs: Not on file  Physical Activity: Not on file  Stress: Not on file  Social Connections: Not on file  Intimate Partner Violence: Not on file    Allergies   Ambien [zolpidem], Demerol [meperidine], Haldol [haloperidol], Lunesta [eszopiclone], and Tramadol  Family history:   Family History  Problem Relation Age of Onset  . Mitral valve prolapse Father   . Heart Problems Mother   . Hypertension Mother     Current Medications:   Prior to Admission medications   Medication Sig Start Date End Date Taking? Authorizing Provider  acetaminophen (TYLENOL) 325 MG tablet Take 2 tablets (650 mg total) by mouth every 6 (six) hours as needed for mild pain (or Fever >/= 101). 11/29/18   Elgergawy, Silver Huguenin, MD  cyclobenzaprine (FLEXERIL) 5 MG tablet Take 1 tablet (5 mg total) by mouth 3 (three) times daily as needed for muscle spasms. 11/29/18   Elgergawy, Silver Huguenin, MD  diclofenac (VOLTAREN) 75 MG EC tablet Take 75 mg by mouth 2 (two) times daily. 02/22/21   [provider]  diclofenac sodium (VOLTAREN) 1 % GEL Apply 2 g topically daily. For knee pain    [provider]  escitalopram (LEXAPRO) 10 MG tablet Take 1 tablet (10 mg total) by mouth daily. 09/15/20   Cameron Sprang, MD  ezetimibe (ZETIA) 10 MG tablet Take 10 mg by mouth at bedtime.      [provider]  ferrous sulfate 325 (65 FE) MG tablet Take 1 tablet (325 mg total) by mouth daily with breakfast. 03/03/21   Charlynne Cousins, MD  furosemide (LASIX) 20 MG tablet Take 20 mg by mouth as needed for fluid or edema. 08/21/20   [provider]  levETIRAcetam (KEPPRA) 750 MG tablet Take 2 tablets (1,500 mg total) by mouth 2 (two) times daily. 03/03/21   Charlynne Cousins, MD  levothyroxine (SYNTHROID, LEVOTHROID) 50 MCG tablet Take 50 mcg by mouth daily before breakfast.    [provider]  lisinopril (PRINIVIL,ZESTRIL) 10 MG tablet Take 10 mg by mouth daily.    [provider]  LORazepam (ATIVAN) 1 MG tablet TAKE 1 TABLET AS NEEDED FOR SEIZURE. DO NOT TAKE MORE THAN 2 IN 24 HRS. Patient taking differently: Take 1 mg by mouth as needed. FOR SEIZURE. DO NOT TAKE MORE THAN 2 IN 24 HRS. 09/15/20   Cameron Sprang, MD  Multiple Vitamins-Minerals (MULTIVITAMIN WITH MINERALS) tablet Take 1 tablet by mouth daily.    [provider]  naproxen sodium (ALEVE) 220 MG tablet Take 440 mg by mouth at bedtime.    [provider]  nitrofurantoin, macrocrystal-monohydrate, (MACROBID) 100 MG capsule Take 100 mg by mouth daily. Patient not taking: Reported on 02/26/2021    [provider]  pentoxifylline (TRENTAL) 400 MG CR tablet Take 1 tablet (400 mg total) by mouth 3 (three) times daily with meals. 12/30/20   Newt Minion, MD  pramipexole (MIRAPEX) 0.125 MG tablet Take 0.375 mg by mouth at bedtime.    [provider]  rivastigmine (EXELON) 1.5 MG capsule TAKE (1) CAPSULE TWICE DAILY. Patient taking differently: Take 1.5 mg by mouth 2 (two) times daily. 12/27/20   Cameron Sprang, MD    Physical Exam:   Vitals:   03/29/21 1257 03/29/21 1415 03/29/21 1430 03/29/21 1445  BP:  115/67 111/67 113/72  Pulse: (!) 123 (!) 120 (!) 111 95  Resp:  16 (!) 21 19  Temp:      TempSrc:      SpO2:  98% 100% 100%     Physical  Exam: Blood pressure 113/72, pulse 95, temperature (!) 97.5 F (36.4 C), temperature source Oral, resp. rate 19, SpO2 100 %. Gen: Well groomed patient lying in bed asking "what is going on" and " when will this stop" repeatedly. Eyes: sclera anicteric, conjuctiva pale CVS: S1-S2, tachy, 2/6 systolic murmur. Respiratory:  decreased air entry likely secondary to decreased inspiratory effort GI: NABS, soft, NT, no epigastric tenderness, no rebound tenderness, no voluntary guarding. LE: No edema. No cyanosis Neuro: Significant dementia, moving all extremities equally, answering questions appropriately with no speech deficits. Psych: Clear memory loss     Data Review:    Labs: Basic Metabolic Panel: Recent Labs  Lab 03/29/21 1305  NA 130*  K 5.0  CL 104  CO2 14*  GLUCOSE 119*  BUN 59*  CREATININE 1.19*  CALCIUM 9.2   Liver Function Tests: Recent Labs  Lab 03/29/21 1305  AST 26  ALT 26  ALKPHOS 38  BILITOT 0.9  PROT 6.0*  ALBUMIN 3.7   No results for input(s): LIPASE, AMYLASE in the last 168 hours. No results for input(s): AMMONIA in the last 168 hours. CBC: Recent Labs  Lab 03/29/21 1305  WBC 16.2*  NEUTROABS 12.5*  HGB 8.3*  HCT 27.7*  MCV 103.4*  PLT 257   Cardiac Enzymes: No results for input(s): CKTOTAL, CKMB, CKMBINDEX, TROPONINI in the last 168 hours.  BNP (last 3 results) No results for input(s): PROBNP in the last 8760 hours. CBG: No results for input(s): GLUCAP in the last 168 hours.  Urinalysis    Component Value Date/Time   COLORURINE STRAW (A) 02/25/2021 1840   APPEARANCEUR CLEAR 02/25/2021 1840   LABSPEC 1.012 02/25/2021 1840   PHURINE 8.0 02/25/2021 1840   GLUCOSEU NEGATIVE 02/25/2021 1840   HGBUR SMALL (A) 02/25/2021 [place] 02/25/2021 1840   BILIRUBINUR neg 10/06/2012 0825   KETONESUR 5 (A) 02/25/2021 1840   PROTEINUR NEGATIVE 02/25/2021 1840   UROBILINOGEN 0.2 05/06/2015 1825   NITRITE NEGATIVE 02/25/2021  [place] (A) 02/25/2021 1840      Radiographic Studies: DG Chest Port 1 View  Result Date: 03/29/2021 CLINICAL DATA:  Shortness of breath. EXAM: PORTABLE CHEST 1 VIEW COMPARISON:  Chest x-ray dated February 26, 2021. FINDINGS: The heart size and mediastinal contours are within normal limits. Both lungs are clear. The visualized skeletal structures are unremarkable. IMPRESSION: No active disease. Electronically Signed   By: Titus Dubin M.D.   On: 03/29/2021 14:12   DG Knee Complete 4 Views Right  Result Date: 03/29/2021 CLINICAL DATA:  Fall with right knee EXAM: RIGHT KNEE - COMPLETE 4+ VIEW COMPARISON:  None. FINDINGS: No evidence of fracture, dislocation, or joint effusion. Tricompartment degenerative change most notably in the medial and patellofemoral compartments. Soft tissues are unremarkable. IMPRESSION: Tricompartment degenerative change, no acute osseous abnormality. Electronically Signed   By: Dahlia Bailiff MD   On: 03/29/2021 13:19    EKG: Independently reviewed. Atrial fibrillation/flutter at 136.  Poor baseline.  Fusilli flattened T waves.   Assessment/Plan:   Principal Problem:   New onset atrial fibrillation (HCC) Active Problems:   HTN (hypertension)   Localization-related idiopathic epilepsy and epileptic syndromes with seizures of localized onset, not intractable, without status epilepticus ([place])   GI bleed   Dementia ([place])  87 year old female with dementia recently discharged for seizure disorder is now admitted with melena and A. fib with RVR.  Patient is DNR and daughter would like to transition her to palliative care.  GI bleed Patient with melena x3 days, she is hemodynamically stable at present despite A. fib with RVR. Hemoglobin is 8.3 down from 10.3 last month. Of note patient has been getting 440 mg of Naprosyn every night and started on Eliquis at [facility] for new diagnosis of A. fib at that time on April 1. Plan: Continue Protonix  drip H&H every 8 hours, POA Renee Zuniga is okay for transfusion if hemoglobin does fall. They do not want to pursue any invasive diagnostic procedures, secure chat sent to Dr. Benson Norway regarding my conversation with POA Renee Zuniga.A. fib with RVR Continue diltiazem drip Restart metoprolol XL 25 mg per home doses, increase as tolerated.  Goals for care/Palliative care Long discussion with patient's POA Renee Zuniga, plan is to transition to palliative care with no invasive procedures.  Her mother has explicitly stated that she would like to "die at home" and "go see my husband" who died 58 months ago. Palliative care consultation placed. Renee Zuniga would like for patient to be discharged to hospice if that is reasonable.  Hyponatremia Likely secondary to intravascular fluid depletion We are hydrating at present, hold Lasix, transfuse as needed.  AKI Should improve with hydration and transfusion if warranted  Seizure disorder Continue Keppra  HTN Hold all antihypertensives  Dementia On no meds for dementia  Other information:   DVT prophylaxis: SCD ordered. Code Status: DNR Family Communication: Spoke at length with patient's daughter Renee Zuniga who is her POA Disposition Plan: TBD Consults called: Palliative care, GI Admission status: Inpatient  Renee Zuniga [facility]  If 7PM-7AM, please contact night-coverage [email or web address] Password [email or web address] 03/29/2021, 3:30 PM

## 2021-03-29 NOTE — ED Provider Notes (Signed)
[facility] EMERGENCY DEPARTMENT Provider Note   CSN: [id number] Arrival date & time: 03/29/21  1213     History No chief complaint on file.   Renee Zuniga is a 87 y.o. female.  Pt presents to the ED today with weakness.  Pt has dementia and is unable to give any history.  Pt's daughter [name] gives the hx.  Per [name], pt has had increased weakness and decreased appetite for several days.  The pt fell on Tuesday, 4/12 and hit her right knee.  Family took her to ortho who did x-rays.  X-rays were negative.  [name] said she has more bruising to her right knee and around the knee.  She has also noticed that pt's stools were dark.  Pt normally likes to eat, but has not been wanting to eat.  Pt was in the hospital from 3/18-3/24 for CVA and new onset seizures.  Pt is on [place], but no blood thinners.          Past Medical History:  Diagnosis Date  . Arthritis   . Heart murmur   . High blood pressure   . HTN (hypertension) 11/09/2014  . Hypercholesteremia   . Insomnia   . Localization-related idiopathic epilepsy and epileptic syndromes with seizures of localized onset, not intractable, without status epilepticus ([place]) 08/12/2015  . Migraine headache   . Mild neurocognitive disorder, amnestic type 08/22/2017  . Mitral valve prolapse   . Osteoarthritis   . Osteopenia   . Pelvis fracture ([place]) 11/26/2018  . Seizures ([place])   . TIA (transient ischemic attack) 11/09/2014  . Tinnitus   . Vitamin D deficiency     Patient Active Problem List   Diagnosis Date Noted  . Seizure disorder ([place]) 02/26/2021  . Seizure-like activity ([place]) 02/25/2021  . Hypothyroidism 02/25/2021  . Cerebrovascular accident (CVA) ([place])   . Seizure ([place])   . Pelvis fracture ([place]) 11/26/2018  . Mild neurocognitive disorder, amnestic type 08/22/2017  . Aphasia 08/19/2016  . Localization-related idiopathic epilepsy and epileptic syndromes with seizures of localized onset, not intractable, without  status epilepticus ([place]) 08/12/2015  . TIA (transient ischemic attack) 11/09/2014  . HTN (hypertension) 11/09/2014  . Bilateral foot pain 03/31/2014  . Pes planus 03/31/2014  . Subluxation of ankle joint 03/31/2014  . Arthritis   . Heart murmur   . Mitral valve prolapse   . Osteoarthritis   . Vitamin D deficiency   . Migraine headache   . Hypercholesteremia   . Tinnitus     Past Surgical History:  Procedure Laterality Date  . CHOLECYSTECTOMY    . CORRECTION HAMMER TOE Bilateral   . GALLBLADDER SURGERY    . OOPHORECTOMY     benign tumor, per patient  . PARTIAL HYSTERECTOMY  1974   due to uterine prolaspe     OB History   No obstetric history on file.     Family History  Problem Relation Age of Onset  . Mitral valve prolapse Father   . Heart Problems Mother   . Hypertension Mother     Social History   Tobacco Use  . Smoking status: Never Smoker  . Smokeless tobacco: Never Used  Vaping Use  . Vaping Use: Never used  Substance Use Topics  . Alcohol use: Yes    Alcohol/week: 7.0 standard drinks    Types: 7 Glasses of wine per week    Comment: 1 glass of wine nightly with dinner  . Drug use: No  Home Medications Prior to Admission medications   Medication Sig Start Date End Date Taking? Authorizing Provider  acetaminophen (TYLENOL) 325 MG tablet Take 2 tablets (650 mg total) by mouth every 6 (six) hours as needed for mild pain (or Fever >/= 101). 11/29/18   Elgergawy, Silver Huguenin, MD  cyclobenzaprine (FLEXERIL) 5 MG tablet Take 1 tablet (5 mg total) by mouth 3 (three) times daily as needed for muscle spasms. 11/29/18   Elgergawy, Silver Huguenin, MD  diclofenac (VOLTAREN) 75 MG EC tablet Take 75 mg by mouth 2 (two) times daily. 02/22/21   [provider]  diclofenac sodium (VOLTAREN) 1 % GEL Apply 2 g topically daily. For knee pain    [provider]  escitalopram (LEXAPRO) 10 MG tablet Take 1 tablet (10 mg total) by mouth daily. 09/15/20   Cameron Sprang, MD  ezetimibe (ZETIA) 10 MG tablet Take 10 mg by mouth at bedtime.     [provider]  ferrous sulfate 325 (65 FE) MG tablet Take 1 tablet (325 mg total) by mouth daily with breakfast. 03/03/21   Charlynne Cousins, MD  furosemide (LASIX) 20 MG tablet Take 20 mg by mouth as needed for fluid or edema. 08/21/20   [provider]  levETIRAcetam (KEPPRA) 750 MG tablet Take 2 tablets (1,500 mg total) by mouth 2 (two) times daily. 03/03/21   Charlynne Cousins, MD  levothyroxine (SYNTHROID, LEVOTHROID) 50 MCG tablet Take 50 mcg by mouth daily before breakfast.    [provider]  lisinopril (PRINIVIL,ZESTRIL) 10 MG tablet Take 10 mg by mouth daily.    [provider]  LORazepam (ATIVAN) 1 MG tablet TAKE 1 TABLET AS NEEDED FOR SEIZURE. DO NOT TAKE MORE THAN 2 IN 24 HRS. Patient taking differently: Take 1 mg by mouth as needed. FOR SEIZURE. DO NOT TAKE MORE THAN 2 IN 24 HRS. 09/15/20   Cameron Sprang, MD  Multiple Vitamins-Minerals (MULTIVITAMIN WITH MINERALS) tablet Take 1 tablet by mouth daily.    [provider]  naproxen sodium (ALEVE) 220 MG tablet Take 440 mg by mouth at bedtime.    [provider]  nitrofurantoin, macrocrystal-monohydrate, (MACROBID) 100 MG capsule Take 100 mg by mouth daily. Patient not taking: Reported on 02/26/2021    [provider]  pentoxifylline (TRENTAL) 400 MG CR tablet Take 1 tablet (400 mg total) by mouth 3 (three) times daily with meals. 12/30/20   Newt Minion, MD  pramipexole (MIRAPEX) 0.125 MG tablet Take 0.375 mg by mouth at bedtime.    [provider]  rivastigmine (EXELON) 1.5 MG capsule TAKE (1) CAPSULE TWICE DAILY. Patient taking differently: Take 1.5 mg by mouth 2 (two) times daily. 12/27/20   Cameron Sprang, MD    Allergies    Ambien [zolpidem], Demerol [meperidine], Haldol [haloperidol], Lunesta [eszopiclone], and Tramadol  Review of Systems   Review of Systems   Gastrointestinal:       Black stool  Musculoskeletal:       Right knee pain  All other systems reviewed and are negative.   Physical Exam Updated Vital Signs BP 113/72   Pulse 95   Temp (!) 97.5 F (36.4 C) (Oral)   Resp 19   SpO2 100%   Physical Exam Vitals and nursing note reviewed.  Constitutional:      Appearance: Normal appearance.  HENT:     Head: Normocephalic and atraumatic.     Right Ear: External ear normal.     Left  Ear: External ear normal.     Nose: Nose normal.     Mouth/Throat:     Mouth: Mucous membranes are moist.     Pharynx: Oropharynx is clear.  Eyes:     Extraocular Movements: Extraocular movements intact.     Conjunctiva/sclera: Conjunctivae normal.     Pupils: Pupils are equal, round, and reactive to light.  Cardiovascular:     Rate and Rhythm: Tachycardia present. Rhythm irregular.     Pulses: Normal pulses.     Heart sounds: Normal heart sounds.  Pulmonary:     Effort: Pulmonary effort is normal.     Breath sounds: Normal breath sounds.  Abdominal:     General: Abdomen is flat. Bowel sounds are normal.     Palpations: Abdomen is soft.  Genitourinary:    Rectum: Guaiac result positive.     Comments: Black stool Musculoskeletal:        General: Normal range of motion.     Cervical back: Normal range of motion and neck supple.     Comments: Good rom to right knee  Skin:    General: Skin is warm.     Capillary Refill: Capillary refill takes less than 2 seconds.     Findings: Bruising present.     Comments: Bruising to right thigh (posterior, inner) and right knee  Neurological:     General: No focal deficit present.     Mental Status: She is alert. Mental status is at baseline.     ED Results / Procedures / Treatments   Labs (all labs ordered are listed, but only abnormal results are displayed) Labs Reviewed  COMPREHENSIVE METABOLIC PANEL - Abnormal; Notable for the following components:      Result Value   Sodium 130 (*)     CO2 14 (*)    Glucose, Bld 119 (*)    BUN 59 (*)    Creatinine, Ser 1.19 (*)    Total Protein 6.0 (*)    GFR, Estimated 44 (*)    All other components within normal limits  CBC WITH DIFFERENTIAL/PLATELET - Abnormal; Notable for the following components:   WBC 16.2 (*)    RBC 2.68 (*)    Hemoglobin 8.3 (*)    HCT 27.7 (*)    MCV 103.4 (*)    Neutro Abs 12.5 (*)    Monocytes Absolute 1.6 (*)    Abs Immature Granulocytes 0.51 (*)    All other components within normal limits  POC OCCULT BLOOD, ED - Abnormal; Notable for the following components:   Fecal Occult Bld POSITIVE (*)    All other components within normal limits  RESP PANEL BY RT-PCR (FLU A&B, COVID) ARPGX2  PROTIME-INR  TSH  MAGNESIUM  TYPE AND SCREEN  ABO/RH  TROPONIN I (HIGH SENSITIVITY)    EKG EKG Interpretation  Date/Time:  Tuesday March 29 2021 13:02:47 EDT Ventricular Rate:  136 PR Interval:    QRS Duration: 80 QT Interval:  298 QTC Calculation: 448 R Axis:   62 Text Interpretation: Atrial fibrillation with rapid ventricular response ST & T wave abnormality, consider lateral ischemia Abnormal ECG new from prior Confirmed by Isla Pence [phone]) on 03/29/2021 2:22:05 PM   Radiology DG Chest Port 1 View  Result Date: 03/29/2021 CLINICAL DATA:  Shortness of breath. EXAM: PORTABLE CHEST 1 VIEW COMPARISON:  Chest x-ray dated February 26, 2021. FINDINGS: The heart size and mediastinal contours are within normal limits. Both lungs are clear. The visualized skeletal structures are unremarkable.  IMPRESSION: No active disease. Electronically Signed   By: Titus Dubin M.D.   On: 03/29/2021 14:12   DG Knee Complete 4 Views Right  Result Date: 03/29/2021 CLINICAL DATA:  Fall with right knee EXAM: RIGHT KNEE - COMPLETE 4+ VIEW COMPARISON:  None. FINDINGS: No evidence of fracture, dislocation, or joint effusion. Tricompartment degenerative change most notably in the medial and patellofemoral compartments. Soft tissues  are unremarkable. IMPRESSION: Tricompartment degenerative change, no acute osseous abnormality. Electronically Signed   By: Dahlia Bailiff MD   On: 03/29/2021 13:19    Procedures Procedures   Medications Ordered in ED Medications  sodium chloride 0.9 % bolus 1,000 mL (has no administration in time range)    And  0.9 %  sodium chloride infusion (has no administration in time range)  pantoprazole (PROTONIX) 80 mg in sodium chloride 0.9 % 100 mL IVPB (has no administration in time range)  pantoprazole (PROTONIX) 80 mg in sodium chloride 0.9 % 100 mL (0.8 mg/mL) infusion (has no administration in time range)  diltiazem (CARDIZEM) 1 mg/mL load via infusion 20 mg (has no administration in time range)    And  diltiazem (CARDIZEM) 125 mg in dextrose 5% 125 mL (1 mg/mL) infusion (has no administration in time range)    ED Course  I have reviewed the triage vital signs and the nursing notes.  Pertinent labs & imaging results that were available during my care of the patient were reviewed by me and considered in my medical decision making (see chart for details).    MDM Rules/Calculators/A&P                          Pt is in new onset afib.  She is not a candidate for anticoagulation due to gi bleed.  Pt is tachycardic, so I will start her on cardizem.  Chadvasc 4.  Pt's stool is guaiac positive.  She is started on protonix.  Pt's hgb has dropped from 10.7 on 3/23 to 8.3 today.  Family requests Dr. Benson Norway for GI.  I spoke with him and he will see pt in consult.  Pt d/w Dr. Jamse Arn (triad) for admission.  CRITICAL CARE Performed by: Isla Pence   Total critical care time: 30 minutes  Critical care time was exclusive of separately billable procedures and treating other patients.  Critical care was necessary to treat or prevent imminent or life-threatening deterioration.  Critical care was time spent personally by me on the following activities: development of treatment plan with  patient and/or surrogate as well as nursing, discussions with consultants, evaluation of patient's response to treatment, examination of patient, obtaining history from patient or surrogate, ordering and performing treatments and interventions, ordering and review of laboratory studies, ordering and review of radiographic studies, pulse oximetry and re-evaluation of patient's condition.   Final Clinical Impression(s) / ED Diagnoses Final diagnoses:  SOB (shortness of breath)  Atrial fibrillation with RVR (HCC)  Upper GI bleed  Symptomatic anemia    Rx / DC Orders ED Discharge Orders    None       Isla Pence, MD 03/29/21 1524

## 2021-03-30 ENCOUNTER — Other Ambulatory Visit (HOSPITAL_COMMUNITY): Payer: Self-pay

## 2021-03-30 ENCOUNTER — Other Ambulatory Visit: Payer: Medicare Other

## 2021-03-30 ENCOUNTER — Encounter (HOSPITAL_COMMUNITY)
Admission: EM | Disposition: A | Discharge status: Hospice | Payer: Self-pay | Source: Skilled Nursing Facility | Attending: Family Medicine

## 2021-03-30 LAB — BASIC METABOLIC PANEL
Anion gap: 6 (ref 5–15)
BUN: 37 mg/dL — ABNORMAL HIGH (ref 8–23)
CO2: 17 mmol/L — ABNORMAL LOW (ref 22–32)
Calcium: 7.8 mg/dL — ABNORMAL LOW (ref 8.9–10.3)
Chloride: 110 mmol/L (ref 98–111)
Creatinine, Ser: 0.85 mg/dL (ref 0.44–1.00)
GFR, Estimated: >60 mL/min (ref >60)
Glucose, Bld: 110 mg/dL — ABNORMAL HIGH (ref 70–99)
Potassium: 4.6 mmol/L (ref 3.5–5.1)
Sodium: 133 mmol/L — ABNORMAL LOW (ref 135–145)

## 2021-03-30 LAB — HEMOGLOBIN AND HEMATOCRIT, BLOOD
HCT: 19.5 % — ABNORMAL LOW (ref 36.0–46.0)
HCT: 19 % — ABNORMAL LOW (ref 36.0–46.0)
Hemoglobin: 6.3 g/dL — CL (ref 12.0–15.0)
Hemoglobin: 6.1 g/dL — CL (ref 12.0–15.0)

## 2021-03-30 SURGERY — ESOPHAGOGASTRODUODENOSCOPY (EGD) WITH PROPOFOL
Anesthesia: Monitor Anesthesia Care

## 2021-03-30 MED ORDER — MORPHINE SULFATE (CONCENTRATE) 10 MG /0.5 ML PO SOLN
10.0000 mg | ORAL | 0 refills | Status: AC | PRN
  Filled 2021-03-30: qty 15, 5d supply, fill #0

## 2021-03-30 MED ORDER — ONDANSETRON HCL 4 MG/2ML IJ SOLN: 4.0000 mg | Freq: Four times a day (QID) | INTRAMUSCULAR | Status: DC | PRN

## 2021-03-30 MED ORDER — LORAZEPAM 2 MG/ML PO CONC
1.0000 mg | ORAL | 0 refills | Status: DC | PRN
  Filled 2021-03-30: qty 15, 5d supply, fill #0

## 2021-03-30 MED ORDER — PANTOPRAZOLE SODIUM 40 MG PO TBEC
40.0000 mg | DELAYED_RELEASE_TABLET | Freq: Two times a day (BID) | ORAL | Status: DC
  Administered 2021-03-30 – 2021-03-31 (×2): 40 mg via ORAL
  Filled 2021-03-30 (×2): qty 1

## 2021-03-30 MED ORDER — GLYCOPYRROLATE 0.2 MG/ML IJ SOLN: 0.2000 mg | INTRAMUSCULAR | Status: DC | PRN

## 2021-03-30 MED ORDER — BIOTENE DRY MOUTH MT LIQD: 15.0000 mL | Freq: Two times a day (BID) | OROMUCOSAL | Status: DC

## 2021-03-30 MED ORDER — POLYVINYL ALCOHOL 1.4 % OP SOLN
1.0000 [drp] | Freq: Four times a day (QID) | OPHTHALMIC | Status: DC | PRN
  Filled 2021-03-30: qty 15

## 2021-03-30 MED ORDER — LORAZEPAM 1 MG PO TABS: 1.0000 mg | ORAL_TABLET | ORAL | Status: DC | PRN

## 2021-03-30 MED ORDER — GLYCOPYRROLATE 1 MG PO TABS
1.0000 mg | ORAL_TABLET | ORAL | Status: DC | PRN
  Filled 2021-03-30: qty 1

## 2021-03-30 MED ORDER — LORAZEPAM 2 MG/ML IJ SOLN
1.0000 mg | INTRAMUSCULAR | Status: DC | PRN
  Administered 2021-03-30: 1 mg via INTRAVENOUS
  Filled 2021-03-30: qty 1

## 2021-03-30 MED ORDER — ONDANSETRON 4 MG PO TBDP
4.0000 mg | ORAL_TABLET | Freq: Four times a day (QID) | ORAL | Status: DC | PRN
  Filled 2021-03-30: qty 1

## 2021-03-30 MED ORDER — PANTOPRAZOLE SODIUM 40 MG PO TBEC
40.0000 mg | DELAYED_RELEASE_TABLET | Freq: Two times a day (BID) | ORAL | 0 refills | Status: DC
Start: 2021-03-30 — End: 2021-03-30
  Filled 2021-03-30: qty 60, 30d supply, fill #0

## 2021-03-30 MED ORDER — LORAZEPAM 2 MG/ML PO CONC
1.0000 mg | ORAL | Status: DC | PRN
  Filled 2021-03-30: qty 0.5

## 2021-03-30 MED ORDER — LORAZEPAM 1 MG PO TABS
1.0000 mg | ORAL_TABLET | ORAL | 0 refills | Status: AC | PRN
  Filled 2021-03-30: qty 30, 5d supply, fill #0

## 2021-03-30 MED ORDER — MORPHINE SULFATE (PF) 2 MG/ML IV SOLN
2.0000 mg | INTRAVENOUS | Status: DC | PRN
  Administered 2021-03-30: 2 mg via INTRAVENOUS
  Filled 2021-03-30: qty 1

## 2021-03-30 NOTE — TOC Transition Note (Addendum)
Transition of Care [facility]) - CM/SW Discharge Note   Patient Details  Name: Renee Zuniga MRN: [id number] Date of Birth: 1931-04-07  [facility]) CM/SW Contact:  Renee Mayo, RN Phone Number: 03/30/2021, 11:39 AM   Clinical Narrative:    Patient is active with [facility] for outpatient palliative services, and now would like home hospice with [facility] per Palliative NP.  [facility] is notified.  They will need a hospital bed.  NCM  Left vm for POA Shanetra Blumenstock to return call [phone]. Chrislyn has been in contact with POA also.    4/21- DME has been delivered to home per [name] to address [address], [place] [address]. Patient will go home via PTAR . Will schedule ptar when dc order put in.  NCM scheduled ptar, transport.   Final next level of care: Home w Hospice Care Barriers to Discharge: Equipment Delay   Patient Goals and CMS Choice Patient states their goals for this hospitalization and ongoing recovery are:: home with hospice      Discharge Placement                       Discharge Plan and Services                          HH Arranged: RN [facility] Agency:  ([place]) Date [place]: 03/30/21 Time [place]: 1139 Representative spoke with at [place]: [place] ([place]) Interventions     Readmission Risk Interventions No flowsheet data found.

## 2021-03-30 NOTE — Progress Notes (Signed)
Manufacturing engineer [facility])  Received request from [facility] for hospice services at home after discharge.  Chart and pt information under review by [facility] physician.  Hospice eligibility pending at this time.  Hospital liaison spoke with patient's daughter, Renee Zuniga, to initiate education related to hospice philosophy and services and to answer any questions at this time.  Renee Zuniga verbalized understanding of information given.  Per discussion the plan is to discharge home today by PTAR once hospital bed has been delivered.  [place] send signed and completed DNR home with pt/family.  Please provide prescriptions at discharge as needed to ensure ongoing symptom management until patient can be admitted onto hospice services.    DME needs discussed. A hospital bed and overbed table are needed for STAT delivery.  Address: [address] [place].  ACC information and contact numbers given to [facility].  Above information shared with Ald[facility] Hipp Manager.  Please call with any questions or concerns.  Thank you for the opportunity to participate in this pt's care.  Domenic Moras, BSN, RN [facility] [phone] (24h on call)

## 2021-03-30 NOTE — Progress Notes (Addendum)
Daily Progress Note   Patient Name: Renee Zuniga       Date: 03/30/2021 DOB: 1931/07/24  Age: 87 y.o. MRN#: [id number] Attending Physician: Elodia Florence., * Primary Care Physician: Seward Carol, MD Admit Date: 03/29/2021  Reason for Consultation/Follow-up: Disposition, Establishing goals of care, Non pain symptom management, Pain control, Psychosocial/spiritual support and Terminal Care  Subjective: Chart review performed.  Received report from primary RN-no acute concerns.  RN states hospitalist spoke with family and was agreeable to stop Protonix and IV fluids as patient was getting agitated with lines and pump beeping.   Went to visit patient at bedside- daughter/[name], 2 grandsons, and 1 friend/Teresia Shirlee Limerick were present.  Patient was lying in bed awake, alert, and able to participate in conversation; however, not able to make complex medical decisions.  Patient continues to state "I want to go home." No signs or non-verbal gestures of pain or discomfort noted. No respiratory distress, increased work of breathing, or secretions noted.   Therapeutic listening and emotional support provided to family/friend.  Prognostication was reviewed per their request.  Discussed disposition options.  Family/friend are supportive of patient's wishes and of Zakiyah Diop as Education officer, community. All questions and concerns addressed. Encouraged to call with questions and/or concerns. PMT card provided.  Spoke with and reviewed case with Dr. Florene Glen.  He had just gotten off the phone with [name] after providing medical updates.  Called [facility] hospice liaison to review bed availability -Per liaison there are no [facility] available today.  Called daughter/HCPOA/Vanita Anda Kraft to continue [place]  conversations and offer emotional support.  Reviewed disposition options to include residential hospice versus home hospice with family/private caregivers versus [place] with hospice.  Informed Nickie Warwick there were no available [facility] today.  We discussed that patient's window for low risk transfer is likely closing and that she may not be stable for transfer in the near future -educated that in this instance we would recommend patient remain in-house to receive end-of-life care and would anticipate hospital death.  We reviewed that with home hospice the majority of care does fall to family/private caregivers and what the expectations of patient's needs would be with GI bleed.  Jovee Dettinger does not feel that home hospice is the best option and also feels she does not  want the patient to pass away in the hospital.  [name] requests time to review information and options with someone else that she trusts and will call PMT back with final decision.   11:30 AM [name] called PMT back - plan is for patient to return home with hospice services.  Sada Mazzoni explains she had contacted patient's 2 personal private duty caregivers and both of them feel comfortable caring for the patient in this instance along with family and hospice.  Tabor Bartram explains that she does have DME needs, most importantly hospital bed.  She requests patient not be discharged until hospital bed can be delivered.  Patient is already enrolled with [facility] outpatient [facility] and [facility] Stehlin wishes to continue with their service for hospice.  Plan is confirmed for full comfort measures in house at this time with hopes that we can get patient discharged home while still stable for transfer. All questions and concerns addressed. Encouraged to call with questions and/or concerns. PMT card previously provided.  Addressed Kimmarie Pascale states she would like patient discharged to: [facility] [place]. [place], [place]  [id number]  Therapeutic listening and emotional support provided throughout interactions with all patient's family/friends.   Length of Stay: 1  Current Medications: Scheduled Meds:  . escitalopram  10 mg Oral Daily  . levETIRAcetam  1,500 mg Oral BID  . levothyroxine  50 mcg Oral Q0600  . metoprolol succinate  25 mg Oral Daily  . pantoprazole  40 mg Oral BID  . pramipexole  0.375 mg Oral QHS  . rivastigmine  1.5 mg Oral BID    Continuous Infusions: . sodium chloride Stopped (03/30/21 0900)    PRN Meds: acetaminophen **OR** acetaminophen, LORazepam  Physical Exam Vitals and nursing note reviewed.  Constitutional:      General: She is not in acute distress.    Appearance: She is ill-appearing.  Pulmonary:     Effort: No respiratory distress.  Skin:    General: Skin is warm and dry.     Coloration: Skin is pale.  Neurological:     Mental Status: She is alert.     Motor: Weakness present.  Psychiatric:        Attention and Perception: Attention normal.        Mood and Affect: Mood is anxious.        Behavior: Behavior is cooperative.             Vital Signs: BP 115/72 (BP Location: Right Arm)   Pulse 97   Temp 98.2 F (36.8 C) (Oral)   Resp 18   SpO2 99%  SpO2: SpO2: 99 % O2 Device: O2 Device: Room Air O2 Flow Rate:    Intake/output summary:   Intake/Output Summary (Last 24 hours) at 03/30/2021 1046 Last data filed at 03/30/2021 1020 Gross per 24 hour  Intake --  Output 1501 ml  Net -1501 ml   LBM: Last BM Date: 03/29/21 Baseline Weight:   Most recent weight:         Palliative Assessment/Data: PPS 20 - 30%    Flowsheet Rows   Flowsheet Row Most Recent Value  Intake Tab   Referral Department Hospitalist  Unit at Time of Referral ER  Palliative Care Primary Diagnosis Neurology  Date Notified 03/29/21  Reason for referral Clarify Goals of Care  Date of Admission 03/29/21  Date first seen by Palliative Care 03/29/21  # of days Palliative referral  response time 0 Day(s)  # of days IP  prior to Palliative referral 0  Clinical Assessment   Psychosocial & Spiritual Assessment   Palliative Care Outcomes   Patient/Family meeting held? Yes  Who was at the meeting? daughter  Palliative Care Outcomes Clarified goals of care, Counseled regarding hospice, Provided end of life care assistance, Provided psychosocial or spiritual support, Changed to focus on comfort, Transitioned to hospice  Patient/Family wishes: Interventions discontinued/not started  Mechanical Ventilation, Antibiotics, BiPAP, Tube feedings/TPN, NIPPV, Hemodialysis, Trach, Transfer out of ICU, Vasopressors, PEG, Transfusion      Patient Active Problem List   Diagnosis Date Noted  . GI bleed 03/29/2021  . New onset atrial fibrillation ([place]) 03/29/2021  . Dementia ([place]) 03/29/2021  . Seizure disorder ([place]) 02/26/2021  . Seizure-like activity ([place]) 02/25/2021  . Hypothyroidism 02/25/2021  . Cerebrovascular accident (CVA) ([place])   . Seizure ([place])   . Pelvis fracture ([place]) 11/26/2018  . Mild neurocognitive disorder, amnestic type 08/22/2017  . Aphasia 08/19/2016  . Localization-related idiopathic epilepsy and epileptic syndromes with seizures of localized onset, not intractable, without status epilepticus ([place]) 08/12/2015  . TIA (transient ischemic attack) 11/09/2014  . HTN (hypertension) 11/09/2014  . Bilateral foot pain 03/31/2014  . Pes planus 03/31/2014  . Subluxation of ankle joint 03/31/2014  . Arthritis   . Heart murmur   . Mitral valve prolapse   . Osteoarthritis   . Vitamin D deficiency   . Migraine headache   . Hypercholesteremia   . Tinnitus     Palliative Care Assessment & Plan   Patient Profile: 87 y.o. female  with past medical history of TIA, seizures, dementia presented to [facility] ED on 03/29/2021 from nursing home with complaints of bruising to right knee -it is unknown if she fell.  Patient was admitted on 03/29/2021 with GI bleed, hyponatremia, A. fib  with RVR, and AKI.   ED Course:The patient was noted to be afebrile, have tachycardia to 138and have low normal blood pressures. EKG revealed that patient is in A. fib with RVR. Hemoglobin today was noted to be 8.3 which is down from 10.7 on March 23, her last admission. Patient's daughter [name] who came with her mother requested GI evaluation by Dr. Benson Norway who evaluated the patient and recommended Protonix with EGD in the morning. Patient was started on a Protonix drip and a diltiazem drip for management of RVR. I subsequently spoke with [name] who noted that they would like a palliative care consult, would not like to pursue EGD or colonoscopy and would like to undergo conservative management with blood transfusions if warranted.   Of note, patient was recently seen by PMT during her last hospitalization from 3/18 - 03/03/2021.  Patient is also followed by outpatient palliative care.  Assessment: GI bleed Hyponatremia AKI Dementia Seizure disorder Atrial fibrillation with RVR Terminal care  Recommendations/Plan: Initiated full comfort measures Continue DNR/DNI as previously documented Plan is for patient to discharge home with hospice after hospital bed has been delivered -TOC and [place] liaison notified; TOC consult placed Patient's window for low risk transfer is likely closing soon, if she becomes too unstable for transfer recommend patient remain in-house for end-of-life care Added orders for EOL symptom management and to reflect full comfort measures, as well as discontinued orders that were not focused on comfort Continuing Protonix while patient in house with hopes she can remain stable enough for discharge home once hospital bed has been delivered - recommend discontinuing Protonix and metoprolol on discharge or if she becomes too unstable for transfer and  remains in house for end-of-life care IV fluids discontinued Provide frequent assessments and administer PRN medications as  clinically necessary to ensure EOL comfort PMT will continue to follow and support holistically  Goals of Care and Additional Recommendations:  Limitations on Scope of Treatment: Full Comfort Care  Code Status:    Code Status Orders  (From admission, onward)         Start     Ordered   03/29/21 1626  Do not attempt resuscitation (DNR)  Continuous       Question Answer Comment  In the event of cardiac or respiratory ARREST Do not call a "code blue"   In the event of cardiac or respiratory ARREST Do not perform Intubation, CPR, defibrillation or ACLS   In the event of cardiac or respiratory ARREST Use medication by any route, position, wound care, and other measures to relive pain and suffering. May use oxygen, suction and manual treatment of airway obstruction as needed for comfort.      03/29/21 1627        Code Status History    Date Active Date Inactive Code Status Order ID Comments User Context   02/25/2021 2324 03/03/2021 1934 DNR [id number]  Lenore Cordia, MD ED   11/26/2018 0434 11/29/2018 1627 Full Code [id number]  Rise Patience, MD ED   08/19/2016 1943 08/20/2016 1620 Full Code [id number]  Florencia Reasons, MD Inpatient   11/09/2014 2353 11/10/2014 1930 Full Code [id number]  Ivor Costa, MD Inpatient   Advance Care Planning Activity    Advance Directive Documentation   Flowsheet Row Most Recent Value  Type of Advance Directive Healthcare Power of Attorney, Living will, Out of facility DNR (pink MOST or yellow form)  Pre-existing out of facility DNR order (yellow form or pink MOST form) --  [DNR with the pt. ]  "MOST" Form in Place? --       Prognosis:   Hours - Days   Discharge Planning:  Home with Hospice   Care plan was discussed with primary RN, patient's family, Dr. Florene Glen, [facility], hospice liaison  Thank you for allowing the Palliative Medicine Team to assist in the care of this patient.   Total Time  70 minutes Prolonged Time Billed  yes       Greater than  50%  of this time was spent counseling and coordinating care related to the above assessment and plan.  Lin Landsman, NP  Please contact Palliative Medicine Team phone at [phone] for questions and concerns.   *Portions of this note are a verbal dictation therefore any spelling and/or grammatical errors are due to the "[employer] [employer]" system interpretation.

## 2021-03-30 NOTE — Plan of Care (Signed)
  Problem: Education: Goal: Knowledge of General Education information will improve Description Including pain rating scale, medication(s)/side effects and non-pharmacologic comfort measures Outcome: Adequate for Discharge   Problem: Health Behavior/Discharge Planning: Goal: Ability to manage health-related needs will improve Outcome: Adequate for Discharge   

## 2021-03-30 NOTE — Discharge Summary (Addendum)
Physician Discharge Summary  Renee Zuniga HQP:[id number] DOB: 01/21/1931 DOA: 03/29/2021  PCP: Renee Carol, MD  Admit date: 03/29/2021 Discharge date: 03/30/2021  Time spent: 40 minutes  Recommendations for Outpatient Follow-up:  1. Comfort measures per hospice    Discharge Diagnoses:  Principal Problem:   New onset atrial fibrillation (Renee Zuniga) Active Problems:   HTN (hypertension)   Localization-related idiopathic epilepsy and epileptic syndromes with seizures of localized onset, not intractable, without status epilepticus (Renee Zuniga)   GI bleed   Dementia (Renee Zuniga)   Discharge Condition: stable  Diet recommendation: comfort  There were no vitals filed for this visit.  History of present illness:  87 y.o. female with PMH significant for dementia, HTN and recent admission for seizure disorder who was brought in by her daughter for weakness and shortness of breath with "her color is pale" x3 days.  Also with melena.  Presenting symptoms concerning for GI bleed with developing anemia as well.  After discussion with family, palliative care, the decision was made to transition patient to comfort measures.    See below for additional details   Hospital Course:   87 year old female with dementia recently discharged for seizure disorder is now admitted with melena and Renee Zuniga. fib with RVR.  Patient is DNR and daughter would like to transition her to comfort measures.   Goals for care/Palliative care Per discussion with POA, Renee Zuniga, pt, family, and palliative care -> planning for comfort measures D/c home with hospice after hospital bed delivered Appreciate palliative care assistance and recommendations  GI bleed Patient with melena x3 days, she is hemodynamically stable at present despite [name]. fib with RVR. Hb continuing to downtrend Of note patient has been getting 440 mg of Naprosyn every night and started on Eliquis at [facility] for new diagnosis of Renee Zuniga. fib at that time on April  1. Plan: Initially seen by Dr. Benson Norway with plan for endoscopy, but after discussion, plan to transition to comfort measures Comfort measures as noted above  Hyponatremia Comfort measures  AKI Comfort measures as noted above  Seizure disorder Continue Keppra  HTN Comfort measures  Dementia Continue pramipexole and rivastigmine   Procedures:  none  Consultations:  GI  Palliative care  Discharge Exam: Vitals:   03/29/21 1719 03/30/21 0545  BP: (!) 87/61 115/72  Pulse: 73 97  Resp: 18 18  Temp: 97.6 F (36.4 C) 98.2 F (36.8 C)  SpO2: 99% 99%   She expresses wanting to go home - not wanting to go to Renee Zuniga facility  Several family members at bedside, discussed with Renee Zuniga over phone - their desire is for focus on comfort - don't desire life prolonging interventions at this time - sounds like they've talked about this with mother before  General: No acute distress. Cardiovascular: Heart sounds show Renee Zuniga regular rate, and rhythm.  Lungs: Clear to auscultation bilaterally  Abdomen: Soft, nontender, nondistended  Neurological: Alert and oriented. Moves all extremities 4 . Cranial nerves II through XII grossly intact. Skin: Warm and dry. No rashes or lesions. Extremities: No clubbing or cyanosis. No edema.  Discharge Instructions   Discharge Instructions    Diet - low sodium heart healthy   Complete by: As directed    Discharge instructions   Complete by: As directed    You were seen for Renee Zuniga GI bleed.  After discussion with your providers and palliative care, we've decided to focus on your comfort.  Plan is for discharge home with hospice.   Increase activity slowly  Complete by: As directed      Allergies as of 03/30/2021      Reactions   Ambien [zolpidem] Nausea And Vomiting   Demerol [meperidine] Nausea And Vomiting      Haldol [haloperidol]    delirium   Lunesta [eszopiclone] Other (See Comments)   Affected speech   Tramadol       Medication List     STOP taking these medications   Eliquis 5 MG Tabs tablet Generic drug: apixaban   ezetimibe 10 MG tablet Commonly known as: ZETIA   furosemide 20 MG tablet Commonly known as: LASIX   lisinopril 10 MG tablet Commonly known as: ZESTRIL   metoprolol succinate 25 MG 24 hr tablet Commonly known as: TOPROL-XL   naproxen sodium 220 MG tablet Commonly known as: ALEVE   nitrofurantoin 100 MG capsule Commonly known as: MACRODANTIN     TAKE these medications   acetaminophen 325 MG tablet Commonly known as: TYLENOL Take 2 tablets (650 mg total) by mouth every 6 (six) hours as needed for mild pain (or Fever >/= 101).   cyclobenzaprine 5 MG tablet Commonly known as: FLEXERIL Take 1 tablet (5 mg total) by mouth 3 (three) times daily as needed for muscle spasms.   escitalopram 10 MG tablet Commonly known as: Lexapro Take 1 tablet (10 mg total) by mouth daily.   levETIRAcetam 750 MG tablet Commonly known as: KEPPRA Take 2 tablets (1,500 mg total) by mouth 2 (two) times daily.   levothyroxine 50 MCG tablet Commonly known as: SYNTHROID Take 50 mcg by mouth daily before breakfast.   LORazepam 1 MG tablet Commonly known as: ATIVAN Take 1 tablet (1 mg total) by mouth every 4 (four) hours as needed for up to 5 days for anxiety, seizure, sedation or sleep. What changed:   how much to take  how to take this  when to take this  reasons to take this  additional instructions   morphine CONCENTRATE 10 mg / 0.5 ml concentrated solution Place 0.5 mLs (10 mg total) under the tongue every 4 (four) hours as needed for up to 5 days for severe pain.   multivitamin with minerals tablet Take 1 tablet by mouth daily.   pentoxifylline 400 MG CR tablet Commonly known as: TRENTAL Take 1 tablet (400 mg total) by mouth 3 (three) times daily with meals.   pramipexole 0.125 MG tablet Commonly known as: MIRAPEX Take 0.375 mg by mouth at bedtime.   rivastigmine 1.5 MG capsule Commonly  known as: EXELON TAKE (1) CAPSULE TWICE DAILY. What changed: See the new instructions.      Allergies  Allergen Reactions  . Ambien [Zolpidem] Nausea And Vomiting  . Demerol [Meperidine] Nausea And Vomiting       . Haldol [Haloperidol]     delirium  . Lunesta [Eszopiclone] Other (See Comments)    Affected speech  . Tramadol       The results of significant diagnostics from this hospitalization (including imaging, microbiology, ancillary and laboratory) are listed below for reference.    Significant Diagnostic Studies: MR BRAIN WO CONTRAST  Result Date: 03/01/2021 CLINICAL DATA:  Possible stroke EXAM: MRI HEAD WITHOUT CONTRAST TECHNIQUE: Multiplanar, multiecho pulse sequences of the brain and surrounding structures were obtained without intravenous contrast. COMPARISON:  2017, recent CT imaging FINDINGS: Brain: There is no acute infarction or intracranial hemorrhage. There is no intracranial mass, mass effect, or edema. There is no hydrocephalus or extra-axial fluid collection. Patchy T2 hyperintensity in the supratentorial white  matter is nonspecific but likely reflects mild to moderate chronic microvascular ischemic changes. Small chronic bilateral cerebellar infarcts. Prominence of the ventricles and sulci reflects generalized parenchymal volume loss. Vascular: Major vessel flow voids at the skull base are preserved. Skull and upper cervical spine: Normal marrow signal is preserved. Sinuses/Orbits: Paranasal sinuses are aerated. Orbits are unremarkable. Other: Sella is unremarkable.  Mastoid air cells are clear. IMPRESSION: No acute infarction, hemorrhage, or mass. Mild to moderate chronic microvascular ischemic changes. Chronic cerebellar infarcts. Electronically Signed   By: Macy Mis M.D.   On: 03/01/2021 13:19   DG Chest Port 1 View  Result Date: 03/29/2021 CLINICAL DATA:  Shortness of breath. EXAM: PORTABLE CHEST 1 VIEW COMPARISON:  Chest x-ray dated February 26, 2021.  FINDINGS: The heart size and mediastinal contours are within normal limits. Both lungs are clear. The visualized skeletal structures are unremarkable. IMPRESSION: No active disease. Electronically Signed   By: Titus Dubin M.D.   On: 03/29/2021 14:12   DG Knee Complete 4 Views Right  Result Date: 03/29/2021 CLINICAL DATA:  Fall with right knee EXAM: RIGHT KNEE - COMPLETE 4+ VIEW COMPARISON:  None. FINDINGS: No evidence of fracture, dislocation, or joint effusion. Tricompartment degenerative change most notably in the medial and patellofemoral compartments. Soft tissues are unremarkable. IMPRESSION: Tricompartment degenerative change, no acute osseous abnormality. Electronically Signed   By: Dahlia Bailiff MD   On: 03/29/2021 13:19    Microbiology: Recent Results (from the past 240 hour(s))  Resp Panel by RT-PCR (Flu Rosalva Neary&B, Covid) Nasopharyngeal Swab     Status: None   Collection Time: 03/29/21  3:31 PM   Specimen: Nasopharyngeal Swab; Nasopharyngeal(NP) swabs in vial transport medium  Result Value Ref Range Status   SARS Coronavirus 2 by RT PCR NEGATIVE NEGATIVE Final    Comment: (NOTE) SARS-CoV-2 target nucleic acids are NOT DETECTED.  The SARS-CoV-2 RNA is generally detectable in upper respiratory specimens during the acute phase of infection. The lowest concentration of SARS-CoV-2 viral copies this assay can detect is 138 copies/mL. Gitty Osterlund negative result does not preclude SARS-Cov-2 infection and should not be used as the sole basis for treatment or other patient management decisions. Jerlean Peralta negative result may occur with  improper specimen collection/handling, submission of specimen other than nasopharyngeal swab, presence of viral mutation(s) within the areas targeted by this assay, and inadequate number of viral copies(<138 copies/mL). Nico Syme negative result must be combined with clinical observations, patient history, and epidemiological information. The expected result is Negative.  Fact  Sheet for Patients:  EntrepreneurPulse.com.au  Fact Sheet for Healthcare Providers:  IncredibleEmployment.be  This test is no t yet approved or cleared by the [place] FDA and  has been authorized for detection and/or diagnosis of SARS-CoV-2 by FDA under an Emergency Use Authorization (EUA). This EUA will remain  in effect (meaning this test can be used) for the duration of the COVID-19 declaration under Section 564(b)(1) of the Act, 21 U.S.C.section 360bbb-3(b)(1), unless the authorization is terminated  or revoked sooner.       Influenza Starletta Houchin by PCR NEGATIVE NEGATIVE Final   Influenza B by PCR NEGATIVE NEGATIVE Final    Comment: (NOTE) The Xpert Xpress SARS-CoV-2/FLU/RSV plus assay is intended as an aid in the diagnosis of influenza from Nasopharyngeal swab specimens and should not be used as Jaymes Revels sole basis for treatment. Nasal washings and aspirates are unacceptable for Xpert Xpress SARS-CoV-2/FLU/RSV testing.  Fact Sheet for Patients: EntrepreneurPulse.com.au  Fact Sheet for Healthcare Providers: IncredibleEmployment.be  This  test is not yet approved or cleared by the [place] and has been authorized for detection and/or diagnosis of SARS-CoV-2 by FDA under an Emergency Use Authorization (EUA). This EUA will remain in effect (meaning this test can be used) for the duration of the COVID-19 declaration under Section 564(b)(1) of the Act, 21 U.S.C. section 360bbb-3(b)(1), unless the authorization is terminated or revoked.  Performed at [facility], [place] [address] [place], [place] [address]      Labs: Basic Metabolic Panel: Recent Labs  Lab 03/29/21 1305 03/29/21 1433 03/30/21 0021  NA 130*  --  133*  K 5.0  --  4.6  CL 104  --  110  CO2 14*  --  17*  GLUCOSE 119*  --  110*  BUN 59*  --  37*  CREATININE 1.19*  --  0.85  CALCIUM 9.2  --  7.8*  MG  --  2.0  --    Liver  Function Tests: Recent Labs  Lab 03/29/21 1305  AST 26  ALT 26  ALKPHOS 38  BILITOT 0.9  PROT 6.0*  ALBUMIN 3.7   No results for input(s): LIPASE, AMYLASE in the last 168 hours. No results for input(s): AMMONIA in the last 168 hours. CBC: Recent Labs  Lab 03/29/21 1305 03/30/21 0021 03/30/21 0814  WBC 16.2*  --   --   NEUTROABS 12.5*  --   --   HGB 8.3* 6.3* 6.1*  HCT 27.7* 19.5* 19.0*  MCV 103.4*  --   --   PLT 257  --   --    Cardiac Enzymes: No results for input(s): CKTOTAL, CKMB, CKMBINDEX, TROPONINI in the last 168 hours. BNP: BNP (last 3 results) No results for input(s): BNP in the last 8760 hours.  ProBNP (last 3 results) No results for input(s): PROBNP in the last 8760 hours.  CBG: No results for input(s): GLUCAP in the last 168 hours.     Signed:  Fayrene Helper MD.  [facility] 03/30/2021, 3:37 PM

## 2021-03-31 DIAGNOSIS — K922 Gastrointestinal hemorrhage, unspecified: Secondary | ICD-10-CM

## 2021-03-31 NOTE — Progress Notes (Addendum)
Discharge home with hospice today. Discussed plan extensively with Renee Zuniga, her son, Renee Zuniga and Renee Zuniga.  Renee Zuniga and Renee Zuniga had questions regarding if they (family) were doing right thing.  Discussed options and discussed important thing was to honor Renee Zuniga's wishes - discussed extensively, but ultimately, I think what we've planned is within Renee Zuniga's wishes based on conversations and chart review.  Questions about prognosis, which is honestly difficult to predict at this time, no reported melena today per nursing and bleeding could have slowed, though steady downtrend in hemoglobin until yesterday.  Hospice I think remains appropriate based on our discussions, family agreeable.  Plan for discharge when transportation available.  Renee Zuniga in no apparent distress, pleasant, without complaint this morning when I spoke to her.  See discharge note from 4/20 for discharge plan.   Vitals:   03/31/21 0929 03/31/21 1136  BP: 128/71 126/82  Pulse: 87 (!) 101  Resp: 18 18  Temp: 97.7 F (36.5 C) 98.6 F (37 C)  SpO2: 98% 100%

## 2021-03-31 NOTE — Progress Notes (Signed)
Daily Progress Note   Patient Name: Renee Zuniga       Date: 03/31/2021 DOB: Apr 03, 1931  Age: 87 y.o. MRN#: [id number] Attending Physician: Elodia Florence., * Primary Care Physician: Seward Carol, MD Admit Date: 03/29/2021  Reason for Consultation/Follow-up: symptom management, end of life care  Subjective: Patient is alert, expresses she is eager to go home. Denies pain or other acute complaints.   Daughter is at bedside. She states the hospital bed was delivered yesterday. Discharge paperwork has been done - they are just awaiting transport.  Length of Stay: 2  Current Medications: Scheduled Meds:  . antiseptic oral rinse  15 mL Topical BID  . escitalopram  10 mg Oral Daily  . levETIRAcetam  1,500 mg Oral BID  . metoprolol succinate  25 mg Oral Daily  . pantoprazole  40 mg Oral BID  . pramipexole  0.375 mg Oral QHS     PRN Meds: acetaminophen **OR** acetaminophen, glycopyrrolate **OR** glycopyrrolate **OR** glycopyrrolate, LORazepam **OR** LORazepam **OR** LORazepam, morphine injection, ondansetron **OR** ondansetron (ZOFRAN) IV, polyvinyl alcohol  Physical Exam Constitutional:      General: She is not in acute distress.    Appearance: She is ill-appearing.  Pulmonary:     Effort: Pulmonary effort is normal.  Neurological:     Mental Status: She is alert.     Motor: Weakness present.             Vital Signs: BP 126/82 (BP Location: Left Arm)   Pulse (!) 101   Temp 98.6 F (37 C) (Oral)   Resp 18   SpO2 100%  SpO2: SpO2: 100 % O2 Device: O2 Device: Room Air O2 Flow Rate:    Intake/output summary:   Intake/Output Summary (Last 24 hours) at 03/31/2021 1443 Last data filed at 03/31/2021 9485 Gross per 24 hour  Intake 240 ml  Output 1400 ml  Net -1160 ml    LBM: Last BM Date: 03/30/21 Baseline Weight:   Most recent weight:         Palliative Assessment/Data: PPS 20-30%     Palliative Care Assessment & Plan   Patient Profile: 87 y.o.femalewith past medical history of TIA, seizures, [facility] complaints of bruising to right knee-it is unknown if she fell. Patient was admitted  on4/19/2022with GI bleed, hyponatremia, A. fib with RVR, and AKI.  ED Course:The patient was noted to be afebrile, have tachycardia to 138and have low normal blood pressures. EKG revealed that patient is in A. fib with RVR. Hemoglobin today was noted to be 8.3 which is down from 10.7 on March 23, her last admission. Patient's daughter [name] who came with her mother requested GI evaluation by Dr. Benson Norway who evaluated the patient and recommended Protonix with EGD in the morning. Patient was started on a Protonix drip and a diltiazem drip for management of RVR. I subsequently spoke with [name] who noted that they would like a palliative care consult, would not like to pursue EGD or colonoscopy and would like to undergo conservative management with blood transfusions if warranted.  Of note, patient was recently seen by PMT during her last hospitalization from 3/18 - 03/03/2021.Patient is also followed by outpatient palliative care.  Assessment: GI bleed Hyponatremia AKI Dementia Seizure disorder Atrial fibrillation with RVR Terminal care  Recommendations/Plan:  Continue full comfort measures  DNR/DNI as previously documented  Pending discharge home with hospice - patient is stable for discharge at this time  Goals of Care and Additional Recommendations:  Limitations on Scope of Treatment: Full Comfort Care  Code Status: DNR/DNI  Prognosis:  Days  Discharge Planning:  Home with Hospice   Thank you for allowing the Palliative Medicine Team to assist in the care of this  patient.   Total Time 15 minutes Prolonged Time Billed  no       Greater than 50%  of this time was spent counseling and coordinating care related to the above assessment and plan.  Lavena Bullion, NP  Please contact Palliative Medicine Team phone at [phone] for questions and concerns.

## 2021-03-31 NOTE — Progress Notes (Signed)
D/C instructions given and reviewed with family at bedside. IV removed, tolerated well. Awaiting [facility] transport.

## 2021-03-31 NOTE — Plan of Care (Signed)

## 2021-03-31 NOTE — Consult Note (Signed)
   [facility] Inpatient Consult   03/31/2021  Renee Zuniga 1931-04-25 [id number]  [facility] [ACO] Patient: Medicare CMS DCE  Primary Care Provider: listed as Seward Carol, MD, [facility] Physician who is listed to provide the Transition of Care follow up calls and appointments   Patient screened for hospitalization with noted less than 30 day unplanned readmission to assess for potential [place] [facility] service needs for post hospital transition.  Review of patient's medical record reveals patient is for home with hospice with [employer].   Plan:  Will sign off as needs will be met at hospice level of care.  For questions contact:   Natividad Brood, RN BSN [facility] Liaison  [phone] business mobile phone Toll free office [phone]  Fax number: [phone] [email or web address]

## 2021-03-31 NOTE — Plan of Care (Signed)
  Problem: Activity: Goal: Risk for activity intolerance will decrease Outcome: Progressing   Problem: Nutrition: Goal: Adequate nutrition will be maintained Outcome: Progressing   Problem: Coping: Goal: Level of anxiety will decrease Outcome: Progressing   Problem: Elimination: Goal: Will not experience complications related to bowel motility Outcome: Progressing Goal: Will not experience complications related to urinary retention Outcome: Progressing   

## 2021-04-01 DIAGNOSIS — E039 Hypothyroidism, unspecified: Secondary | ICD-10-CM | POA: Diagnosis not present

## 2021-04-01 DIAGNOSIS — R252 Cramp and spasm: Secondary | ICD-10-CM | POA: Diagnosis not present

## 2021-04-01 DIAGNOSIS — F419 Anxiety disorder, unspecified: Secondary | ICD-10-CM | POA: Diagnosis not present

## 2021-04-01 DIAGNOSIS — D500 Iron deficiency anemia secondary to blood loss (chronic): Secondary | ICD-10-CM | POA: Diagnosis not present

## 2021-04-01 DIAGNOSIS — K922 Gastrointestinal hemorrhage, unspecified: Secondary | ICD-10-CM | POA: Diagnosis not present

## 2021-04-01 DIAGNOSIS — N1831 Chronic kidney disease, stage 3a: Secondary | ICD-10-CM | POA: Diagnosis not present

## 2021-04-01 DIAGNOSIS — J302 Other seasonal allergic rhinitis: Secondary | ICD-10-CM | POA: Diagnosis not present

## 2021-04-01 DIAGNOSIS — E559 Vitamin D deficiency, unspecified: Secondary | ICD-10-CM | POA: Diagnosis not present

## 2021-04-01 DIAGNOSIS — G309 Alzheimer's disease, unspecified: Secondary | ICD-10-CM | POA: Diagnosis not present

## 2021-04-01 DIAGNOSIS — F32A Depression, unspecified: Secondary | ICD-10-CM | POA: Diagnosis not present

## 2021-04-01 DIAGNOSIS — E785 Hyperlipidemia, unspecified: Secondary | ICD-10-CM | POA: Diagnosis not present

## 2021-04-01 DIAGNOSIS — G9340 Encephalopathy, unspecified: Secondary | ICD-10-CM | POA: Diagnosis not present

## 2021-04-01 DIAGNOSIS — I129 Hypertensive chronic kidney disease with stage 1 through stage 4 chronic kidney disease, or unspecified chronic kidney disease: Secondary | ICD-10-CM | POA: Diagnosis not present

## 2021-04-01 DIAGNOSIS — H04123 Dry eye syndrome of bilateral lacrimal glands: Secondary | ICD-10-CM | POA: Diagnosis not present

## 2021-04-01 DIAGNOSIS — R1310 Dysphagia, unspecified: Secondary | ICD-10-CM | POA: Diagnosis not present

## 2021-04-01 DIAGNOSIS — I4891 Unspecified atrial fibrillation: Secondary | ICD-10-CM | POA: Diagnosis not present

## 2021-04-01 DIAGNOSIS — Z6827 Body mass index (BMI) 27.0-27.9, adult: Secondary | ICD-10-CM | POA: Diagnosis not present

## 2021-04-01 DIAGNOSIS — G43909 Migraine, unspecified, not intractable, without status migrainosus: Secondary | ICD-10-CM | POA: Diagnosis not present

## 2021-04-01 DIAGNOSIS — Z8673 Personal history of transient ischemic attack (TIA), and cerebral infarction without residual deficits: Secondary | ICD-10-CM | POA: Diagnosis not present

## 2021-04-01 DIAGNOSIS — G2581 Restless legs syndrome: Secondary | ICD-10-CM | POA: Diagnosis not present

## 2021-04-01 DIAGNOSIS — R634 Abnormal weight loss: Secondary | ICD-10-CM | POA: Diagnosis not present

## 2021-04-01 DIAGNOSIS — F0281 Dementia in other diseases classified elsewhere with behavioral disturbance: Secondary | ICD-10-CM | POA: Diagnosis not present

## 2021-04-02 DIAGNOSIS — G309 Alzheimer's disease, unspecified: Secondary | ICD-10-CM | POA: Diagnosis not present

## 2021-04-02 DIAGNOSIS — D500 Iron deficiency anemia secondary to blood loss (chronic): Secondary | ICD-10-CM | POA: Diagnosis not present

## 2021-04-02 DIAGNOSIS — F0281 Dementia in other diseases classified elsewhere with behavioral disturbance: Secondary | ICD-10-CM | POA: Diagnosis not present

## 2021-04-02 DIAGNOSIS — R1310 Dysphagia, unspecified: Secondary | ICD-10-CM | POA: Diagnosis not present

## 2021-04-02 DIAGNOSIS — G9340 Encephalopathy, unspecified: Secondary | ICD-10-CM | POA: Diagnosis not present

## 2021-04-02 DIAGNOSIS — K922 Gastrointestinal hemorrhage, unspecified: Secondary | ICD-10-CM | POA: Diagnosis not present

## 2021-04-04 DIAGNOSIS — K922 Gastrointestinal hemorrhage, unspecified: Secondary | ICD-10-CM | POA: Diagnosis not present

## 2021-04-04 DIAGNOSIS — F0281 Dementia in other diseases classified elsewhere with behavioral disturbance: Secondary | ICD-10-CM | POA: Diagnosis not present

## 2021-04-04 DIAGNOSIS — R1310 Dysphagia, unspecified: Secondary | ICD-10-CM | POA: Diagnosis not present

## 2021-04-04 DIAGNOSIS — D500 Iron deficiency anemia secondary to blood loss (chronic): Secondary | ICD-10-CM | POA: Diagnosis not present

## 2021-04-04 DIAGNOSIS — G9340 Encephalopathy, unspecified: Secondary | ICD-10-CM | POA: Diagnosis not present

## 2021-04-04 DIAGNOSIS — G309 Alzheimer's disease, unspecified: Secondary | ICD-10-CM | POA: Diagnosis not present

## 2021-04-05 DIAGNOSIS — K922 Gastrointestinal hemorrhage, unspecified: Secondary | ICD-10-CM | POA: Diagnosis not present

## 2021-04-05 DIAGNOSIS — G9340 Encephalopathy, unspecified: Secondary | ICD-10-CM | POA: Diagnosis not present

## 2021-04-05 DIAGNOSIS — D500 Iron deficiency anemia secondary to blood loss (chronic): Secondary | ICD-10-CM | POA: Diagnosis not present

## 2021-04-05 DIAGNOSIS — F0281 Dementia in other diseases classified elsewhere with behavioral disturbance: Secondary | ICD-10-CM | POA: Diagnosis not present

## 2021-04-05 DIAGNOSIS — R1310 Dysphagia, unspecified: Secondary | ICD-10-CM | POA: Diagnosis not present

## 2021-04-05 DIAGNOSIS — G309 Alzheimer's disease, unspecified: Secondary | ICD-10-CM | POA: Diagnosis not present

## 2021-04-07 ENCOUNTER — Other Ambulatory Visit (HOSPITAL_COMMUNITY): Payer: Self-pay

## 2021-04-07 DIAGNOSIS — G309 Alzheimer's disease, unspecified: Secondary | ICD-10-CM | POA: Diagnosis not present

## 2021-04-07 DIAGNOSIS — K922 Gastrointestinal hemorrhage, unspecified: Secondary | ICD-10-CM | POA: Diagnosis not present

## 2021-04-07 DIAGNOSIS — R1310 Dysphagia, unspecified: Secondary | ICD-10-CM | POA: Diagnosis not present

## 2021-04-07 DIAGNOSIS — G9340 Encephalopathy, unspecified: Secondary | ICD-10-CM | POA: Diagnosis not present

## 2021-04-07 DIAGNOSIS — D500 Iron deficiency anemia secondary to blood loss (chronic): Secondary | ICD-10-CM | POA: Diagnosis not present

## 2021-04-07 DIAGNOSIS — F0281 Dementia in other diseases classified elsewhere with behavioral disturbance: Secondary | ICD-10-CM | POA: Diagnosis not present

## 2021-04-08 DIAGNOSIS — G309 Alzheimer's disease, unspecified: Secondary | ICD-10-CM | POA: Diagnosis not present

## 2021-04-08 DIAGNOSIS — K922 Gastrointestinal hemorrhage, unspecified: Secondary | ICD-10-CM | POA: Diagnosis not present

## 2021-04-08 DIAGNOSIS — G9340 Encephalopathy, unspecified: Secondary | ICD-10-CM | POA: Diagnosis not present

## 2021-04-08 DIAGNOSIS — D500 Iron deficiency anemia secondary to blood loss (chronic): Secondary | ICD-10-CM | POA: Diagnosis not present

## 2021-04-08 DIAGNOSIS — F0281 Dementia in other diseases classified elsewhere with behavioral disturbance: Secondary | ICD-10-CM | POA: Diagnosis not present

## 2021-04-08 DIAGNOSIS — R1310 Dysphagia, unspecified: Secondary | ICD-10-CM | POA: Diagnosis not present

## 2021-04-10 DIAGNOSIS — Z8673 Personal history of transient ischemic attack (TIA), and cerebral infarction without residual deficits: Secondary | ICD-10-CM | POA: Diagnosis not present

## 2021-04-10 DIAGNOSIS — G309 Alzheimer's disease, unspecified: Secondary | ICD-10-CM | POA: Diagnosis not present

## 2021-04-10 DIAGNOSIS — H04123 Dry eye syndrome of bilateral lacrimal glands: Secondary | ICD-10-CM | POA: Diagnosis not present

## 2021-04-10 DIAGNOSIS — F419 Anxiety disorder, unspecified: Secondary | ICD-10-CM | POA: Diagnosis not present

## 2021-04-10 DIAGNOSIS — R1310 Dysphagia, unspecified: Secondary | ICD-10-CM | POA: Diagnosis not present

## 2021-04-10 DIAGNOSIS — G2581 Restless legs syndrome: Secondary | ICD-10-CM | POA: Diagnosis not present

## 2021-04-10 DIAGNOSIS — G9340 Encephalopathy, unspecified: Secondary | ICD-10-CM | POA: Diagnosis not present

## 2021-04-10 DIAGNOSIS — I129 Hypertensive chronic kidney disease with stage 1 through stage 4 chronic kidney disease, or unspecified chronic kidney disease: Secondary | ICD-10-CM | POA: Diagnosis not present

## 2021-04-10 DIAGNOSIS — F0281 Dementia in other diseases classified elsewhere with behavioral disturbance: Secondary | ICD-10-CM | POA: Diagnosis not present

## 2021-04-10 DIAGNOSIS — K922 Gastrointestinal hemorrhage, unspecified: Secondary | ICD-10-CM | POA: Diagnosis not present

## 2021-04-10 DIAGNOSIS — G43909 Migraine, unspecified, not intractable, without status migrainosus: Secondary | ICD-10-CM | POA: Diagnosis not present

## 2021-04-10 DIAGNOSIS — R252 Cramp and spasm: Secondary | ICD-10-CM | POA: Diagnosis not present

## 2021-04-10 DIAGNOSIS — N1831 Chronic kidney disease, stage 3a: Secondary | ICD-10-CM | POA: Diagnosis not present

## 2021-04-10 DIAGNOSIS — E039 Hypothyroidism, unspecified: Secondary | ICD-10-CM | POA: Diagnosis not present

## 2021-04-10 DIAGNOSIS — E785 Hyperlipidemia, unspecified: Secondary | ICD-10-CM | POA: Diagnosis not present

## 2021-04-10 DIAGNOSIS — D500 Iron deficiency anemia secondary to blood loss (chronic): Secondary | ICD-10-CM | POA: Diagnosis not present

## 2021-04-10 DIAGNOSIS — F32A Depression, unspecified: Secondary | ICD-10-CM | POA: Diagnosis not present

## 2021-04-10 DIAGNOSIS — Z6827 Body mass index (BMI) 27.0-27.9, adult: Secondary | ICD-10-CM | POA: Diagnosis not present

## 2021-04-10 DIAGNOSIS — E559 Vitamin D deficiency, unspecified: Secondary | ICD-10-CM | POA: Diagnosis not present

## 2021-04-10 DIAGNOSIS — J302 Other seasonal allergic rhinitis: Secondary | ICD-10-CM | POA: Diagnosis not present

## 2021-04-10 DIAGNOSIS — R634 Abnormal weight loss: Secondary | ICD-10-CM | POA: Diagnosis not present

## 2021-04-10 DIAGNOSIS — I4891 Unspecified atrial fibrillation: Secondary | ICD-10-CM | POA: Diagnosis not present

## 2021-04-11 DIAGNOSIS — R1310 Dysphagia, unspecified: Secondary | ICD-10-CM | POA: Diagnosis not present

## 2021-04-11 DIAGNOSIS — D500 Iron deficiency anemia secondary to blood loss (chronic): Secondary | ICD-10-CM | POA: Diagnosis not present

## 2021-04-11 DIAGNOSIS — G9340 Encephalopathy, unspecified: Secondary | ICD-10-CM | POA: Diagnosis not present

## 2021-04-11 DIAGNOSIS — F0281 Dementia in other diseases classified elsewhere with behavioral disturbance: Secondary | ICD-10-CM | POA: Diagnosis not present

## 2021-04-11 DIAGNOSIS — K922 Gastrointestinal hemorrhage, unspecified: Secondary | ICD-10-CM | POA: Diagnosis not present

## 2021-04-11 DIAGNOSIS — G309 Alzheimer's disease, unspecified: Secondary | ICD-10-CM | POA: Diagnosis not present

## 2021-04-14 DIAGNOSIS — R1310 Dysphagia, unspecified: Secondary | ICD-10-CM | POA: Diagnosis not present

## 2021-04-14 DIAGNOSIS — G9340 Encephalopathy, unspecified: Secondary | ICD-10-CM | POA: Diagnosis not present

## 2021-04-14 DIAGNOSIS — D500 Iron deficiency anemia secondary to blood loss (chronic): Secondary | ICD-10-CM | POA: Diagnosis not present

## 2021-04-14 DIAGNOSIS — K922 Gastrointestinal hemorrhage, unspecified: Secondary | ICD-10-CM | POA: Diagnosis not present

## 2021-04-14 DIAGNOSIS — F0281 Dementia in other diseases classified elsewhere with behavioral disturbance: Secondary | ICD-10-CM | POA: Diagnosis not present

## 2021-04-14 DIAGNOSIS — G309 Alzheimer's disease, unspecified: Secondary | ICD-10-CM | POA: Diagnosis not present

## 2021-04-18 DIAGNOSIS — G309 Alzheimer's disease, unspecified: Secondary | ICD-10-CM | POA: Diagnosis not present

## 2021-04-18 DIAGNOSIS — R1310 Dysphagia, unspecified: Secondary | ICD-10-CM | POA: Diagnosis not present

## 2021-04-18 DIAGNOSIS — F0281 Dementia in other diseases classified elsewhere with behavioral disturbance: Secondary | ICD-10-CM | POA: Diagnosis not present

## 2021-04-18 DIAGNOSIS — G9340 Encephalopathy, unspecified: Secondary | ICD-10-CM | POA: Diagnosis not present

## 2021-04-18 DIAGNOSIS — K922 Gastrointestinal hemorrhage, unspecified: Secondary | ICD-10-CM | POA: Diagnosis not present

## 2021-04-18 DIAGNOSIS — D500 Iron deficiency anemia secondary to blood loss (chronic): Secondary | ICD-10-CM | POA: Diagnosis not present

## 2021-04-20 DIAGNOSIS — G9340 Encephalopathy, unspecified: Secondary | ICD-10-CM | POA: Diagnosis not present

## 2021-04-20 DIAGNOSIS — D500 Iron deficiency anemia secondary to blood loss (chronic): Secondary | ICD-10-CM | POA: Diagnosis not present

## 2021-04-20 DIAGNOSIS — R1310 Dysphagia, unspecified: Secondary | ICD-10-CM | POA: Diagnosis not present

## 2021-04-20 DIAGNOSIS — G309 Alzheimer's disease, unspecified: Secondary | ICD-10-CM | POA: Diagnosis not present

## 2021-04-20 DIAGNOSIS — F0281 Dementia in other diseases classified elsewhere with behavioral disturbance: Secondary | ICD-10-CM | POA: Diagnosis not present

## 2021-04-20 DIAGNOSIS — K922 Gastrointestinal hemorrhage, unspecified: Secondary | ICD-10-CM | POA: Diagnosis not present

## 2021-04-22 DIAGNOSIS — D500 Iron deficiency anemia secondary to blood loss (chronic): Secondary | ICD-10-CM | POA: Diagnosis not present

## 2021-04-22 DIAGNOSIS — R1310 Dysphagia, unspecified: Secondary | ICD-10-CM | POA: Diagnosis not present

## 2021-04-22 DIAGNOSIS — K922 Gastrointestinal hemorrhage, unspecified: Secondary | ICD-10-CM | POA: Diagnosis not present

## 2021-04-22 DIAGNOSIS — G309 Alzheimer's disease, unspecified: Secondary | ICD-10-CM | POA: Diagnosis not present

## 2021-04-22 DIAGNOSIS — G9340 Encephalopathy, unspecified: Secondary | ICD-10-CM | POA: Diagnosis not present

## 2021-04-22 DIAGNOSIS — F0281 Dementia in other diseases classified elsewhere with behavioral disturbance: Secondary | ICD-10-CM | POA: Diagnosis not present

## 2021-04-26 DIAGNOSIS — D500 Iron deficiency anemia secondary to blood loss (chronic): Secondary | ICD-10-CM | POA: Diagnosis not present

## 2021-04-26 DIAGNOSIS — G309 Alzheimer's disease, unspecified: Secondary | ICD-10-CM | POA: Diagnosis not present

## 2021-04-26 DIAGNOSIS — R1310 Dysphagia, unspecified: Secondary | ICD-10-CM | POA: Diagnosis not present

## 2021-04-26 DIAGNOSIS — K922 Gastrointestinal hemorrhage, unspecified: Secondary | ICD-10-CM | POA: Diagnosis not present

## 2021-04-26 DIAGNOSIS — G9340 Encephalopathy, unspecified: Secondary | ICD-10-CM | POA: Diagnosis not present

## 2021-04-26 DIAGNOSIS — F0281 Dementia in other diseases classified elsewhere with behavioral disturbance: Secondary | ICD-10-CM | POA: Diagnosis not present

## 2021-04-29 DIAGNOSIS — F0281 Dementia in other diseases classified elsewhere with behavioral disturbance: Secondary | ICD-10-CM | POA: Diagnosis not present

## 2021-04-29 DIAGNOSIS — G309 Alzheimer's disease, unspecified: Secondary | ICD-10-CM | POA: Diagnosis not present

## 2021-04-29 DIAGNOSIS — G9340 Encephalopathy, unspecified: Secondary | ICD-10-CM | POA: Diagnosis not present

## 2021-04-29 DIAGNOSIS — R1310 Dysphagia, unspecified: Secondary | ICD-10-CM | POA: Diagnosis not present

## 2021-04-29 DIAGNOSIS — D500 Iron deficiency anemia secondary to blood loss (chronic): Secondary | ICD-10-CM | POA: Diagnosis not present

## 2021-04-29 DIAGNOSIS — K922 Gastrointestinal hemorrhage, unspecified: Secondary | ICD-10-CM | POA: Diagnosis not present

## 2021-05-03 ENCOUNTER — Other Ambulatory Visit: Payer: Self-pay | Admitting: Neurology

## 2021-05-03 DIAGNOSIS — K922 Gastrointestinal hemorrhage, unspecified: Secondary | ICD-10-CM | POA: Diagnosis not present

## 2021-05-03 DIAGNOSIS — D500 Iron deficiency anemia secondary to blood loss (chronic): Secondary | ICD-10-CM | POA: Diagnosis not present

## 2021-05-03 DIAGNOSIS — G9340 Encephalopathy, unspecified: Secondary | ICD-10-CM | POA: Diagnosis not present

## 2021-05-03 DIAGNOSIS — R1310 Dysphagia, unspecified: Secondary | ICD-10-CM | POA: Diagnosis not present

## 2021-05-03 DIAGNOSIS — G309 Alzheimer's disease, unspecified: Secondary | ICD-10-CM | POA: Diagnosis not present

## 2021-05-03 DIAGNOSIS — F0281 Dementia in other diseases classified elsewhere with behavioral disturbance: Secondary | ICD-10-CM | POA: Diagnosis not present

## 2021-05-04 ENCOUNTER — Encounter: Payer: Self-pay | Admitting: Neurology

## 2021-05-04 ENCOUNTER — Other Ambulatory Visit: Payer: Self-pay

## 2021-05-04 ENCOUNTER — Telehealth (INDEPENDENT_AMBULATORY_CARE_PROVIDER_SITE_OTHER): Payer: Medicare Other | Admitting: Neurology

## 2021-05-04 DIAGNOSIS — I70234 Atherosclerosis of native arteries of right leg with ulceration of heel and midfoot: Secondary | ICD-10-CM | POA: Diagnosis not present

## 2021-05-04 DIAGNOSIS — G40009 Localization-related (focal) (partial) idiopathic epilepsy and epileptic syndromes with seizures of localized onset, not intractable, without status epilepticus: Secondary | ICD-10-CM

## 2021-05-04 DIAGNOSIS — F0281 Dementia in other diseases classified elsewhere with behavioral disturbance: Secondary | ICD-10-CM | POA: Diagnosis not present

## 2021-05-04 DIAGNOSIS — F02818 Dementia in other diseases classified elsewhere, unspecified severity, with other behavioral disturbance: Secondary | ICD-10-CM

## 2021-05-04 DIAGNOSIS — G301 Alzheimer's disease with late onset: Secondary | ICD-10-CM | POA: Diagnosis not present

## 2021-05-04 MED ORDER — ESCITALOPRAM OXALATE 20 MG PO TABS: 20.0000 mg | ORAL_TABLET | Freq: Every day | ORAL | 11 refills | Status: AC
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
## 2021-05-05 DIAGNOSIS — G309 Alzheimer's disease, unspecified: Secondary | ICD-10-CM | POA: Diagnosis not present

## 2021-05-05 DIAGNOSIS — K922 Gastrointestinal hemorrhage, unspecified: Secondary | ICD-10-CM | POA: Diagnosis not present

## 2021-05-05 DIAGNOSIS — G9340 Encephalopathy, unspecified: Secondary | ICD-10-CM | POA: Diagnosis not present

## 2021-05-05 DIAGNOSIS — R1310 Dysphagia, unspecified: Secondary | ICD-10-CM | POA: Diagnosis not present

## 2021-05-05 DIAGNOSIS — F0281 Dementia in other diseases classified elsewhere with behavioral disturbance: Secondary | ICD-10-CM | POA: Diagnosis not present

## 2021-05-05 DIAGNOSIS — D500 Iron deficiency anemia secondary to blood loss (chronic): Secondary | ICD-10-CM | POA: Diagnosis not present

## 2021-05-06 DIAGNOSIS — D500 Iron deficiency anemia secondary to blood loss (chronic): Secondary | ICD-10-CM | POA: Diagnosis not present

## 2021-05-06 DIAGNOSIS — R1310 Dysphagia, unspecified: Secondary | ICD-10-CM | POA: Diagnosis not present

## 2021-05-06 DIAGNOSIS — K922 Gastrointestinal hemorrhage, unspecified: Secondary | ICD-10-CM | POA: Diagnosis not present

## 2021-05-06 DIAGNOSIS — G309 Alzheimer's disease, unspecified: Secondary | ICD-10-CM | POA: Diagnosis not present

## 2021-05-06 DIAGNOSIS — G9340 Encephalopathy, unspecified: Secondary | ICD-10-CM | POA: Diagnosis not present

## 2021-05-06 DIAGNOSIS — F0281 Dementia in other diseases classified elsewhere with behavioral disturbance: Secondary | ICD-10-CM | POA: Diagnosis not present

## 2021-05-07 DIAGNOSIS — D500 Iron deficiency anemia secondary to blood loss (chronic): Secondary | ICD-10-CM | POA: Diagnosis not present

## 2021-05-07 DIAGNOSIS — G9340 Encephalopathy, unspecified: Secondary | ICD-10-CM | POA: Diagnosis not present

## 2021-05-07 DIAGNOSIS — R1310 Dysphagia, unspecified: Secondary | ICD-10-CM | POA: Diagnosis not present

## 2021-05-07 DIAGNOSIS — F0281 Dementia in other diseases classified elsewhere with behavioral disturbance: Secondary | ICD-10-CM | POA: Diagnosis not present

## 2021-05-07 DIAGNOSIS — G309 Alzheimer's disease, unspecified: Secondary | ICD-10-CM | POA: Diagnosis not present

## 2021-05-07 DIAGNOSIS — K922 Gastrointestinal hemorrhage, unspecified: Secondary | ICD-10-CM | POA: Diagnosis not present

## 2021-05-08 DIAGNOSIS — F0281 Dementia in other diseases classified elsewhere with behavioral disturbance: Secondary | ICD-10-CM | POA: Diagnosis not present

## 2021-05-08 DIAGNOSIS — K922 Gastrointestinal hemorrhage, unspecified: Secondary | ICD-10-CM | POA: Diagnosis not present

## 2021-05-08 DIAGNOSIS — G9340 Encephalopathy, unspecified: Secondary | ICD-10-CM | POA: Diagnosis not present

## 2021-05-08 DIAGNOSIS — G309 Alzheimer's disease, unspecified: Secondary | ICD-10-CM | POA: Diagnosis not present

## 2021-05-08 DIAGNOSIS — D500 Iron deficiency anemia secondary to blood loss (chronic): Secondary | ICD-10-CM | POA: Diagnosis not present

## 2021-05-08 DIAGNOSIS — R1310 Dysphagia, unspecified: Secondary | ICD-10-CM | POA: Diagnosis not present

## 2021-05-11 DEATH — deceased

## 2022-03-22 ENCOUNTER — Other Ambulatory Visit (HOSPITAL_COMMUNITY): Payer: Self-pay

## 2023-10-12 ENCOUNTER — Other Ambulatory Visit (HOSPITAL_BASED_OUTPATIENT_CLINIC_OR_DEPARTMENT_OTHER): Payer: Self-pay

## 8387-08-12 DEATH — deceased
